# Patient Record
Sex: Female | Born: 1950 | Race: Black or African American | Hispanic: No | Marital: Married | Smoking: Never smoker
Health system: Southern US, Community
[De-identification: ages and names within clinical notes are randomized; demographics above are authoritative.]

## PROBLEM LIST (undated history)

## (undated) DIAGNOSIS — D649 Anemia, unspecified: Secondary | ICD-10-CM

## (undated) DIAGNOSIS — K589 Irritable bowel syndrome without diarrhea: Secondary | ICD-10-CM

## (undated) DIAGNOSIS — R05 Cough: Secondary | ICD-10-CM

## (undated) DIAGNOSIS — F439 Reaction to severe stress, unspecified: Secondary | ICD-10-CM

## (undated) DIAGNOSIS — E049 Nontoxic goiter, unspecified: Secondary | ICD-10-CM

## (undated) DIAGNOSIS — G500 Trigeminal neuralgia: Secondary | ICD-10-CM

## (undated) DIAGNOSIS — R51 Headache: Secondary | ICD-10-CM

## (undated) DIAGNOSIS — Z973 Presence of spectacles and contact lenses: Secondary | ICD-10-CM

## (undated) DIAGNOSIS — K219 Gastro-esophageal reflux disease without esophagitis: Secondary | ICD-10-CM

## (undated) DIAGNOSIS — E119 Type 2 diabetes mellitus without complications: Secondary | ICD-10-CM

## (undated) DIAGNOSIS — J45909 Unspecified asthma, uncomplicated: Secondary | ICD-10-CM

## (undated) DIAGNOSIS — R109 Unspecified abdominal pain: Secondary | ICD-10-CM

## (undated) DIAGNOSIS — R053 Chronic cough: Secondary | ICD-10-CM

## (undated) DIAGNOSIS — Z780 Asymptomatic menopausal state: Secondary | ICD-10-CM

## (undated) DIAGNOSIS — R059 Cough, unspecified: Secondary | ICD-10-CM

## (undated) DIAGNOSIS — R0602 Shortness of breath: Secondary | ICD-10-CM

## (undated) DIAGNOSIS — K5790 Diverticulosis of intestine, part unspecified, without perforation or abscess without bleeding: Secondary | ICD-10-CM

## (undated) DIAGNOSIS — M1990 Unspecified osteoarthritis, unspecified site: Secondary | ICD-10-CM

## (undated) DIAGNOSIS — K635 Polyp of colon: Secondary | ICD-10-CM

## (undated) DIAGNOSIS — R42 Dizziness and giddiness: Secondary | ICD-10-CM

## (undated) DIAGNOSIS — K449 Diaphragmatic hernia without obstruction or gangrene: Secondary | ICD-10-CM

## (undated) HISTORY — DX: Cough: R05

## (undated) HISTORY — DX: Headache: R51

## (undated) HISTORY — DX: Unspecified asthma, uncomplicated: J45.909

## (undated) HISTORY — DX: Diverticulosis of intestine, part unspecified, without perforation or abscess without bleeding: K57.90

## (undated) HISTORY — DX: Diaphragmatic hernia without obstruction or gangrene: K44.9

## (undated) HISTORY — DX: Reaction to severe stress, unspecified: F43.9

## (undated) HISTORY — DX: Chronic cough: R05.3

## (undated) HISTORY — DX: Nontoxic goiter, unspecified: E04.9

## (undated) HISTORY — DX: Presence of spectacles and contact lenses: Z97.3

## (undated) HISTORY — DX: Unspecified osteoarthritis, unspecified site: M19.90

## (undated) HISTORY — DX: Shortness of breath: R06.02

## (undated) HISTORY — DX: Unspecified abdominal pain: R10.9

## (undated) HISTORY — DX: Asymptomatic menopausal state: Z78.0

## (undated) HISTORY — DX: Dizziness and giddiness: R42

## (undated) HISTORY — DX: Polyp of colon: K63.5

## (undated) HISTORY — DX: Gastro-esophageal reflux disease without esophagitis: K21.9

## (undated) HISTORY — DX: Type 2 diabetes mellitus without complications: E11.9

## (undated) HISTORY — DX: Irritable bowel syndrome, unspecified: K58.9

## (undated) HISTORY — DX: Cough, unspecified: R05.9

---
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
## 1979-06-15 HISTORY — PX: LAPAROSCOPY: SHX197

## 1999-08-24 ENCOUNTER — Ambulatory Visit (HOSPITAL_COMMUNITY): Admission: RE | Admit: 1999-08-24 | Discharge: 1999-08-24 | Payer: Self-pay | Admitting: Cardiology

## 2001-03-21 ENCOUNTER — Other Ambulatory Visit: Admission: RE | Admit: 2001-03-21 | Discharge: 2001-03-21 | Payer: Self-pay | Admitting: Family Medicine

## 2001-07-14 ENCOUNTER — Ambulatory Visit (HOSPITAL_COMMUNITY): Admission: RE | Admit: 2001-07-14 | Discharge: 2001-07-14 | Payer: Self-pay | Admitting: Obstetrics and Gynecology

## 2001-07-14 ENCOUNTER — Encounter (INDEPENDENT_AMBULATORY_CARE_PROVIDER_SITE_OTHER): Payer: Self-pay

## 2002-06-14 HISTORY — PX: OTHER SURGICAL HISTORY: SHX169

## 2008-10-13 LAB — HM COLONOSCOPY

## 2008-10-31 LAB — HM COLONOSCOPY

## 2010-06-14 HISTORY — PX: OTHER SURGICAL HISTORY: SHX169

## 2010-06-25 ENCOUNTER — Encounter
Admission: RE | Admit: 2010-06-25 | Discharge: 2010-06-25 | Payer: Self-pay | Source: Home / Self Care | Attending: Otolaryngology | Admitting: Otolaryngology

## 2010-06-25 IMAGING — CT CT NECK W/ CM
4 of 5 series · 16 of 33 positions shown, 19 images · IV contrast (agent unspecified)
Comparison: None

CLINICAL DATA: Right.  Mandibular swelling and pain

CT NECK WITH CONTRAST
TECHNIQUE: Multidetector CT imaging of the neck was performed with
intravenous contrast.
Contrast: 75 ml [id number] IV

[Series 3: axial neck · axial · 0.49mm/px · z∈[-50,+45]mm · 3 of 96 slices shown]
[im 20/96  bone]
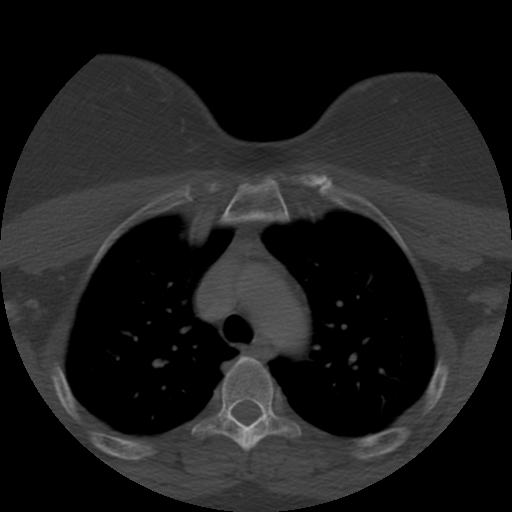
[im 39/96  bone]
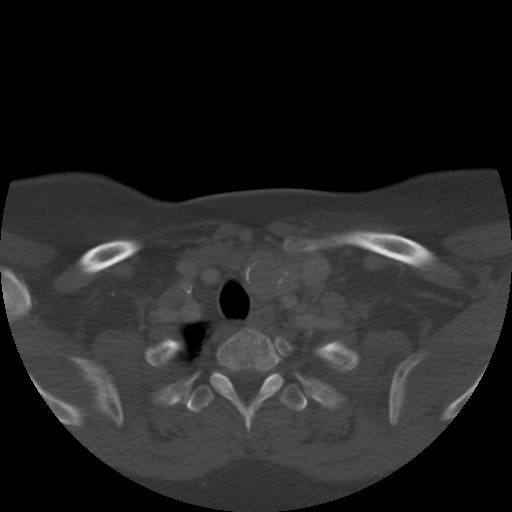
[im 58/96  bone]
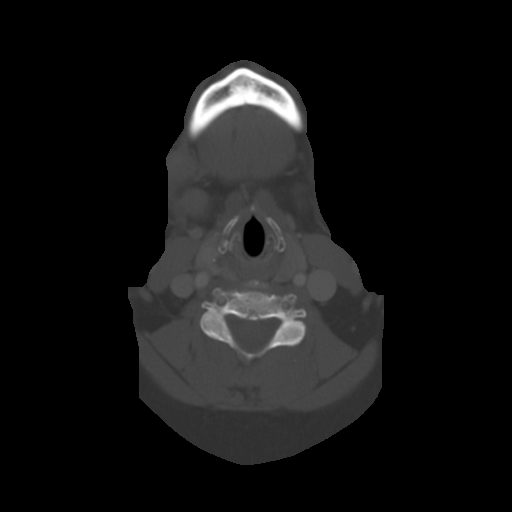

[Series 200: cor · coronal · 0.49mm/px · 3 of 102 slices shown]
[im 33/102  bone]
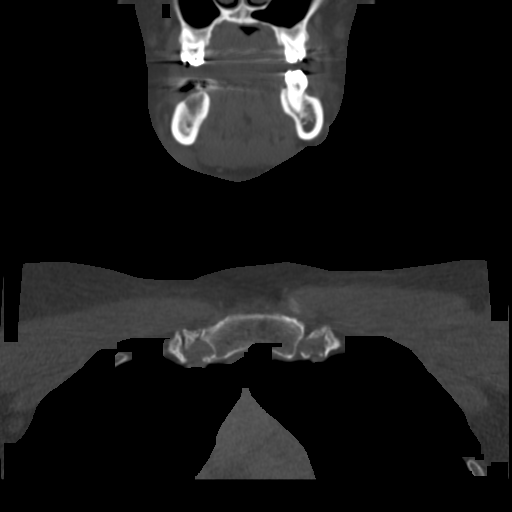
[im 45/102  bone]
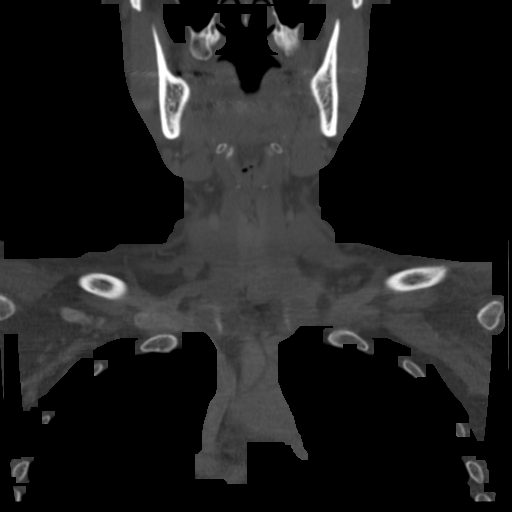
[im 57/102  bone]
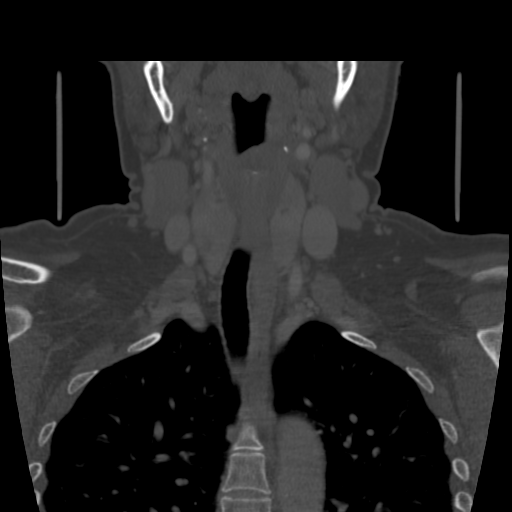

[Series 201: sag · sagittal · 0.49mm/px · 5 of 102 slices shown, 6 images]
[im 34/102  bone]
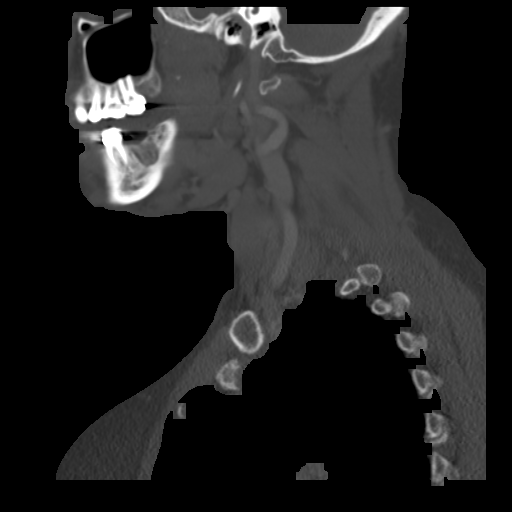
[im 43/102  bone]
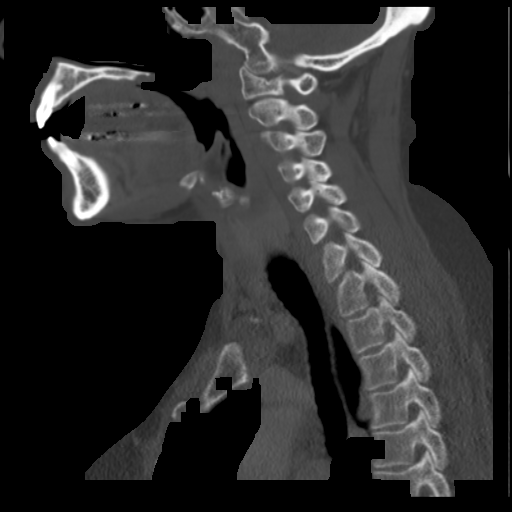
[im 51/102  soft-tissue]
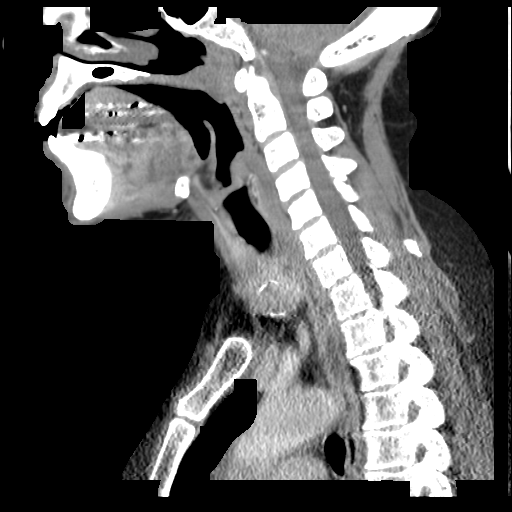
[im 51/102  bone]
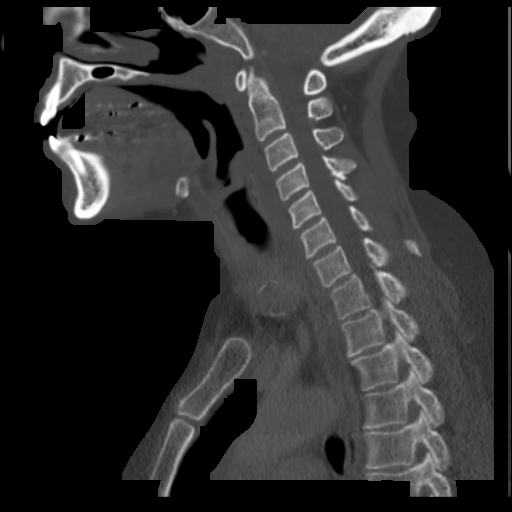
[im 59/102  bone]
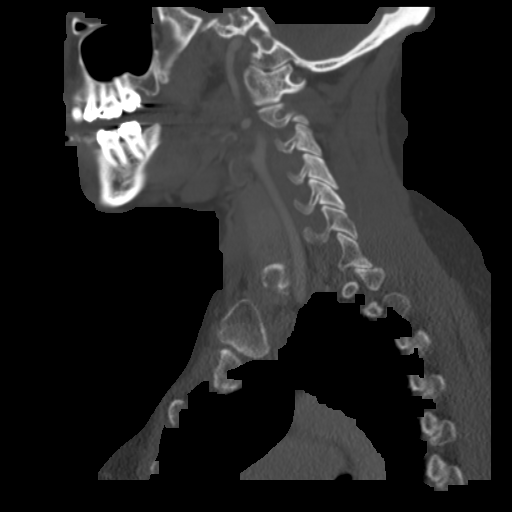
[im 68/102  bone]
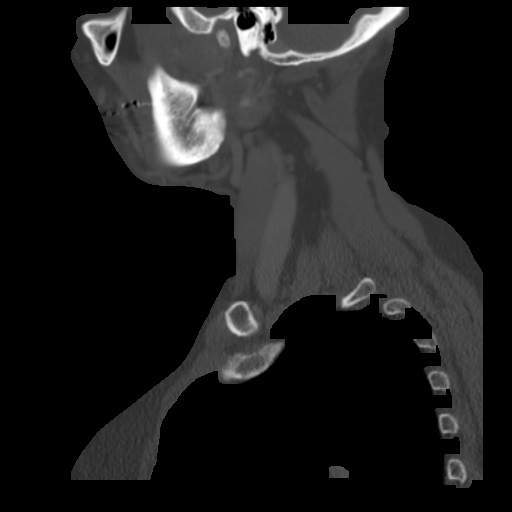

[Series 202: ang axials · axial · 0.49mm/px · z∈[-81,+71]mm · 5 of 118 slices shown, 7 images]
[im 20/118  soft-tissue]
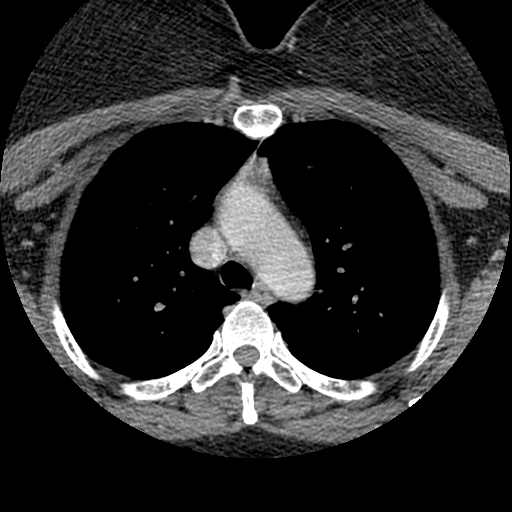
[im 20/118  bone]
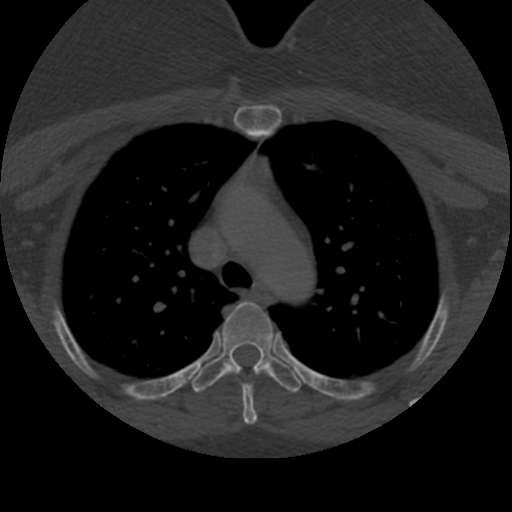
[im 40/118  bone]
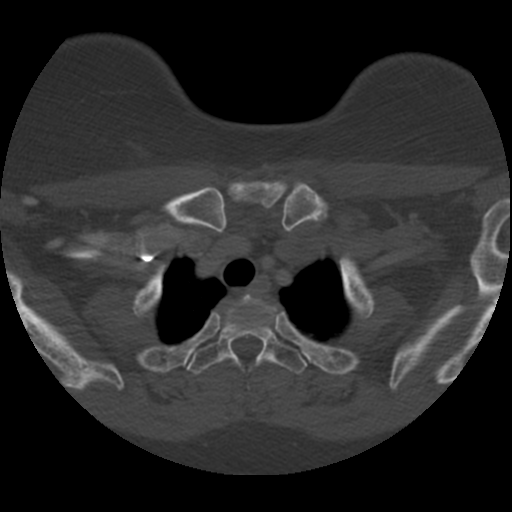
[im 59/118  bone]
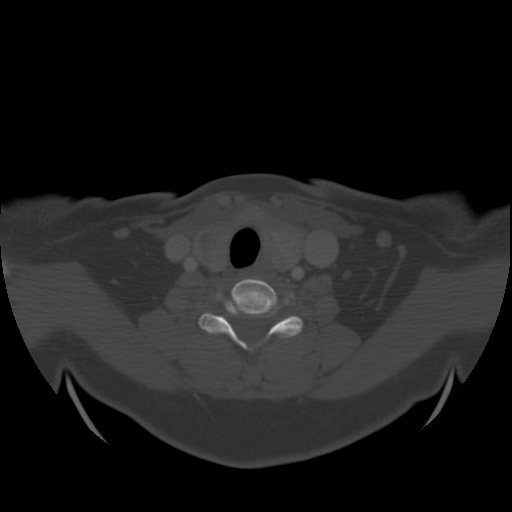
[im 79/118  bone]
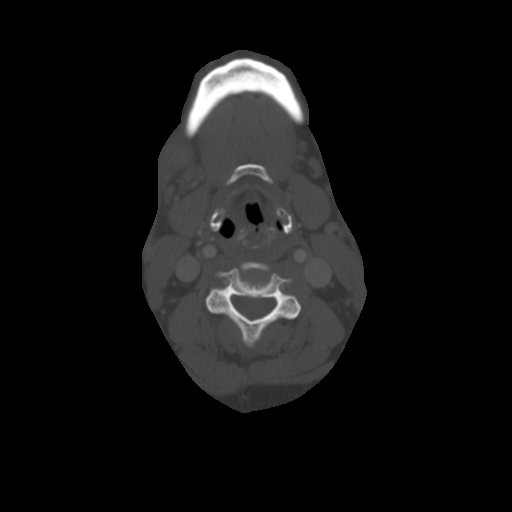
[im 98/118  soft-tissue]
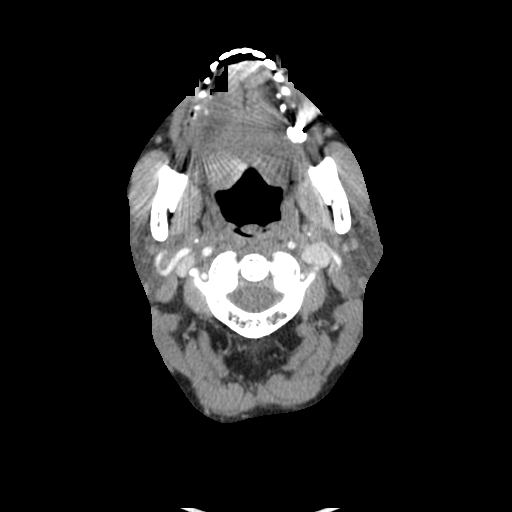
[im 98/118  bone]
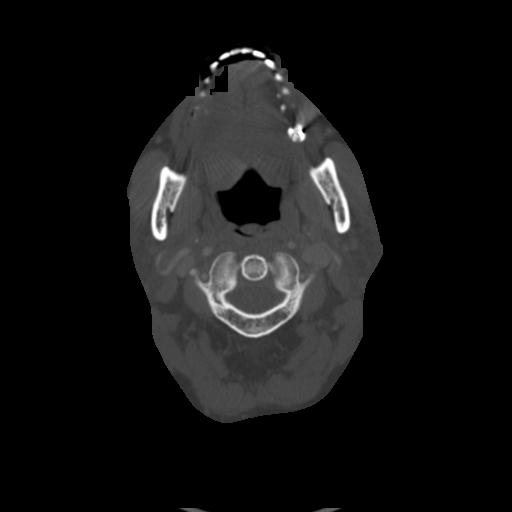

[16 of 33 positions shown; findings below may reference images not displayed]

FINDINGS: Soft tissue mass below the body of the mandible on the
right.  This appears to be centered on the platysmus muscle which
is mildly thickened.  There is stranding and edema in the adjacent
subcutaneous fat.  The soft tissue mass measures approximately 15 x
19 mm and has ill-defined margins and appears to be a solid lesion.
The mass is separate from the mandible and the adjacent mandible
appears normal.  The mass is separate from the submandibular gland
which appears normal.

Submandibular gland is normal bilaterally.  Parotid gland appears
normal bilaterally.  5 x 8 mm benign level I lymph node on the
left.  Small level I lymph nodes in the right medial to the soft
tissue mass.  No pathologic adenopathy in the neck.

Multiple thyroid nodules are present.  Peripheral calcified nodule
the left lower pole measures 14 mm.  Adjacent to this is a 19 mm
nodule with peripheral calcification.  15 mm noncalcified hypodense
lesion in the   right thyroid gland.  Multiple other smaller
thyroid nodules are present bilaterally.

The larynx is normal.  No airway displacement or edema.  Lung
apices are clear.
IMPRESSION: 15 x 19 mm soft tissue mass below the body of the mandible on the
right.  There is surrounding edema and  this may be inflammatory
lesion due to infection.  This could also represent a hematoma.
This could represent an inflamed lymph node.  Metastatic disease to
a lymph node or a primary soft tissue neoplasm are other
considerations.  No pathologic adenopathy.

Multinodular goiter with multiple calcified nodules on the left.
There is a dominant noncalcified nodule on the right, consider
biopsy of the right thyroid nodule.

## 2010-10-30 NOTE — Op Note (Signed)
[facility]  Patient:    Laura Underwood, Laura Underwood Visit Number: [id number] MRN: [id number]          Service Type: DSU Location: [facility] Attending Physician:  Maxie Better Dictated by:   Sheria Lang. Cherly Hensen, M.D. Proc. Date: 07/14/01 Admit Date:  07/14/2001 Discharge Date: 07/14/2001                             Operative Report  PREOPERATIVE DIAGNOSES:         1. Persistent simple left ovarian cyst.                                 2. Metrorrhagia.  POSTOPERATIVE DIAGNOSES:        1. Persistent left ovarian cyst.                                 2. Metrorrhagia.                                 3. Question of submucosal fibroid.  OPERATION:                      Open laparoscopy, left ovarian cystectomy, diagnostic hysteroscopy, dilatation and curettage.  SURGEON:                        Sheronette A. Cherly Hensen, M.D.  ASSISTANT:                      Genia Del, M.D.  ANESTHESIA:                     General.  INDICATIONS:                    This is a 60 year old gravida 3, para 68, female with last menstrual period of June 27, 2001, with a 3.5 cm persistent left ovary cyst of one years duration who now presents for surgical evaluation and management.  The patient also has been having one day of very heavy vaginal bleeding and has been noted on several occasions to have had a thickened endometrium without any evidence of endometrial polyp or uterine fibroids.  The patients history is notable for two previous cesarean sections. The risks and benefits of the procedure have been explained to the patient and consent signed.  The patient was transferred to the operating room.  DESCRIPTION OF PROCEDURE:       Under adequate general anesthesia, the patient was placed in the dorsal lithotomy position.  Examination under anesthesia revealed an anteverted uterus, palpable left adnexal mass.  The right adnexa was without any palpable mass.  The  abdomen, perineum, vagina were thoroughly prepped and draped in the usual fashion.  The bladder was catheterized without any urine noted.  The patient had voided prior to entering the room.  Bivalve speculum was placed in the vagina.  A single-tooth tenaculum was placed in the anterior lip of the cervix.  The cervix has a small Nabothian cyst at around 7 oclock.  The cervical os was serially dilated up to #25 [facility] dilator, and a 5 mm diagnostic hysteroscope was introduced into the uterine cavity without incident.  Inspection was notable for both tubal ostia being seen but sclerosed.  Thickened endometrium circumferentially with question of polyp versus displacement of thickened endometrium.  No evidence of fibroid noted. The hysteroscope was removed.  The cavity was then curetted for a large amount of tissue.  Reinsertion of the diagnostic hysteroscope was notable for continued thickened endometrium.  The hysteroscope was removed.  The cavity was again curetted, and it was notable particularly in the left posterior aspect of the uterus that there was irregularity suggestive of possible submucosal fibroid.  Since no actual polypoid lesion in that area was seen. Once again, the cavity was curetted.  The hysteroscope was subsequently removed.  Endocervical canal was without polyps.  An acorn cannula was introduced into the cervical os and attached to the tenaculum for the manipulation of the uterus.  The bivalve speculum was then removed maintaining thorough technique.  Attention was then turned to the abdomen where a 0.25% Marcaine was injected sub/infraumbilically and a transverse incision was then made and carried down sharply using a scalpel to the rectus fascia.  The rectus fascia was carefully incised and extended vertically.  With entry into the peritoneum confirmed, 0 Vicryl stay sutures were placed on the fascia and a Hasson cannula was introduced, set in place and through this Hasson  the operative laparoscope was introduced.  Initial incisions were then made suprapubic through a 5 mm port which was then placed under direct visualization.  Through that port, the pelvis was inspected and a panoramic view was noted.  No evidence of scarring was noted from her prior surgery. The anterior cul-de-sac was without any scar tissue seen or endometriosis. Posteriorly, there was a small collection of dark spot in one area possibly related to endometriosis but no other areas were seen in the pelvis.  With manipulation of the acorn and tenaculum apparatus the uterus was mobilized. The right tube and ovaries were noted to be normal.  The left tube was normal. The left ovary contained a 3-4 cm cystic structure.  The appendix was noted to be attached to the right abdominal side wall but otherwise without erythema and was noted to be soft.  The liver edge was noted to be normal.  After injecting 0.25% Marcaine in the left lower quadrant, an incision was then made, and a 10 mm port was introduced under direct visualization.  Using the 5 mm adapter through that site the ovary was stabilized using atraumatic grasper.  The antimesenteric surface of the ovary was then cauterized in a linear fashion using Endoshears.  The capsule was then opened and using the scissors the cyst wall was separated from the normal ovarian tissue.  Using the alligator clamps and with also the irrigating system, the ovarian cyst was separated intact from its normal ovarian tissue.  At this point, the base to which the ovarian cyst was still attached was still in place and using the Endobag through the left lower quadrant port the cyst was able to be removed and placed in the bag intact.  At that point, the bag was with the cyst in place was brought to the left lower quadrant site and the trocar removed at that point.  The bag was further opened, and the needle on the syringe was then utilized to aspirate the fluid  from the cyst and remove the bag with the ovarian cyst wall through that left lower quadrant port.  Through the left lower quadrant port the 10 mm trocar was then replaced.  The ovary on the left  was then regrasped and the base was inspected and irrigated.  Small bleeders were cauterized and abdomen irrigated and suctioned of debris with good hemostasis subsequently noted.  The lower ports were removed under direct visualization.  The abdomen was deflated and the upper port was removed. Using S curved retractors, the umbilical incision of the fascia was identified and figure-of-eight sutures were placed and closure with the stay sutures of 0 Vicryl was also done.  The area was then irrigated and small bleeders cauterized.  The skin was approximated using 4-0 Vicryl subcuticular stitch. The left lower quadrant incision also resulted in the fascia being identified and closed with interrupted figure-of-eight suture.  The skin was approximated using 4-0 Vicryl suture as was the suprapubic site.  All of the instruments in the vagina were then removed, specimen labeled endometrial curetting and left ovarian cyst was sent to pathology.  Estimated blood loss was minimal.  The fluid deficit from the hysteroscopic portion was 60 cc.  Complications were none.  The patient tolerated the procedure well and was transferred to the recovery room in stable condition. Dictated by:   Sheria Lang. Cherly Hensen, M.D. Attending Physician:  Maxie Better DD:  07/14/01 TD:  07/17/01 Job: [id number]

## 2010-12-18 ENCOUNTER — Encounter (INDEPENDENT_AMBULATORY_CARE_PROVIDER_SITE_OTHER): Payer: Self-pay | Admitting: Surgery

## 2010-12-18 ENCOUNTER — Ambulatory Visit (INDEPENDENT_AMBULATORY_CARE_PROVIDER_SITE_OTHER): Payer: BC Managed Care – PPO | Admitting: Surgery

## 2010-12-18 VITALS — BP 119/82 | HR 83 | Ht 65.0 in | Wt 194.4 lb

## 2010-12-18 DIAGNOSIS — E049 Nontoxic goiter, unspecified: Secondary | ICD-10-CM

## 2010-12-18 NOTE — Progress Notes (Signed)
Subjective:     Patient ID: Laura Underwood, female   DOB: Dec 04, 1950, 60 y.o.   MRN: [id number]    BP 119/82  Pulse 83  Ht 5\' 5"  (1.651 m)  Wt 194 lb 6.4 oz (88.179 kg)  BMI 32.35 kg/m2    HPI  The patient presents today due to acute multiple thyroid nodules. She is referred by Dr. Concepcion Elk due to these thyroid nodules. She had a cyst became infected in January involving her salivary gland that was drained by Dr. Shon Hough. CT scan obtained at that time showed these thyroid nodules. Multiple thyroid nodules are present. One nodule is in the left lower pole and measures 14 mm. A second nodule is adjacent to this and measures 19 mm with peripheral calcification. A 15 mm nodule is noted in the right thyroid lobe. Smaller nodules are present bilaterally. The patient denies any significant swelling of her neck, masses, or difficulty breathing. Her thyroid function is normal. She does have issues with fatigue. She is not losing weight. She has occasional palpitations.  Review of Systems  Constitutional: Positive for fatigue. Negative for fever and unexpected weight change.  HENT: Negative for hearing loss, ear pain, neck pain and neck stiffness.   Eyes: Negative.   Respiratory: Positive for cough. Negative for shortness of breath and stridor.   Cardiovascular: Positive for palpitations. Negative for chest pain.  Gastrointestinal: Negative.   Genitourinary: Negative.   Musculoskeletal: Negative.   Skin: Negative.   Neurological: Negative.   Hematological: Negative.   Psychiatric/Behavioral: Negative.        Objective:   Physical Exam  Constitutional: She is oriented to person, place, and time. She appears well-developed and well-nourished.  HENT:  Head: Normocephalic and atraumatic.  Nose: Nose normal.  Eyes: Conjunctivae and EOM are normal. Pupils are equal, round, and reactive to light. No scleral icterus.  Neck: Normal range of motion. Neck supple. No tracheal deviation present. No  thyromegaly present.       Thyroid soft.  No masses noted.  Cardiovascular: Normal rate, regular rhythm and intact distal pulses.   Pulmonary/Chest: Effort normal and breath sounds normal.  Musculoskeletal: Normal range of motion.  Neurological: She is alert and oriented to person, place, and time. No cranial nerve deficit.  Skin: Skin is warm and dry. No rash noted.  Psychiatric: She has a normal mood and affect. Her behavior is normal. Judgment and thought content normal.       Assessment:    Multinodular goiter. Plan:  I will obtain an ultrasound of her thyroid gland. If her thyroid ultrasound in stable compared to her CT scan of her neck January of 2012 I will see her back in 6 months. If there are any suspicious changes to her  U/S , I RECOMMENDED FINE NEEDLE ASPIRATION OF THE NODULES.

## 2010-12-18 NOTE — Patient Instructions (Signed)
We will schedule an Ultrasound of the thyroid for the nodules.  If anything appears suspicious,  We will arrange for a biopsy.  If everything appears stable,  I will see you in six months.

## 2010-12-21 ENCOUNTER — Ambulatory Visit
Admission: RE | Admit: 2010-12-21 | Discharge: 2010-12-21 | Disposition: A | Discharge status: Home / Self Care | Payer: Self-pay | Source: Ambulatory Visit | Attending: Surgery | Admitting: Surgery

## 2010-12-21 DIAGNOSIS — E049 Nontoxic goiter, unspecified: Secondary | ICD-10-CM

## 2010-12-21 IMAGING — US US SOFT TISSUE HEAD/NECK
1 series · 14 of 25 positions shown · non-contrast
Comparison: None.

CLINICAL DATA: Follow up multinodular goiter

THYROID ULTRASOUND
TECHNIQUE: Ultrasound examination of the thyroid gland and adjacent
soft tissues was performed.

[Series 1: us soft tissue head/neck · 0.06mm/px · 14 of 78 slices shown]
[im 1/78]
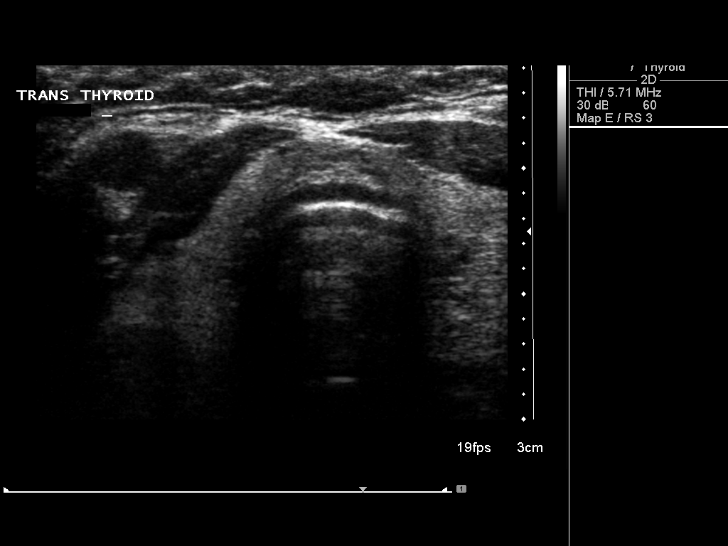
[im 7/78]
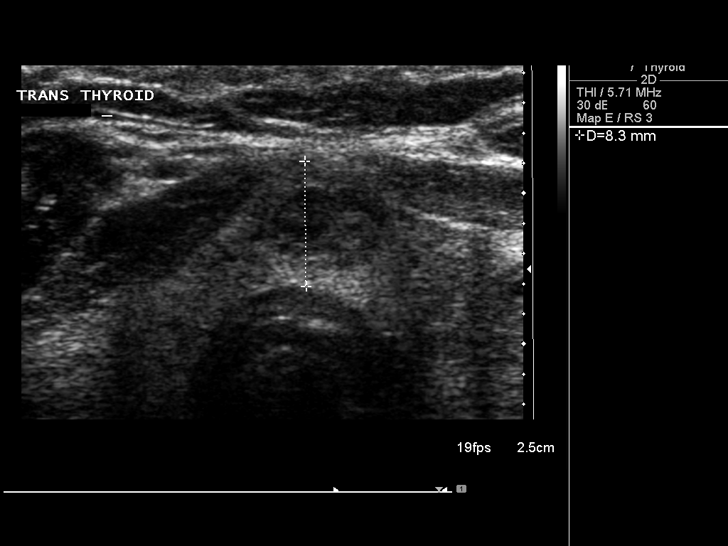
[im 13/78]
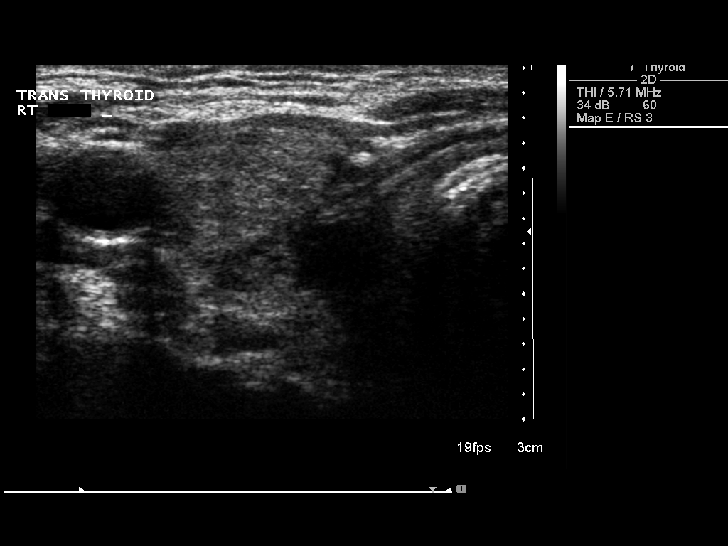
[im 20/78]
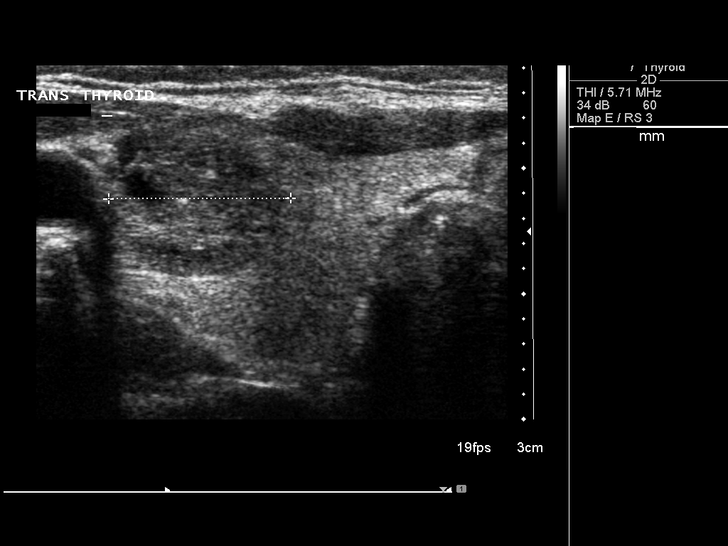
[im 26/78]
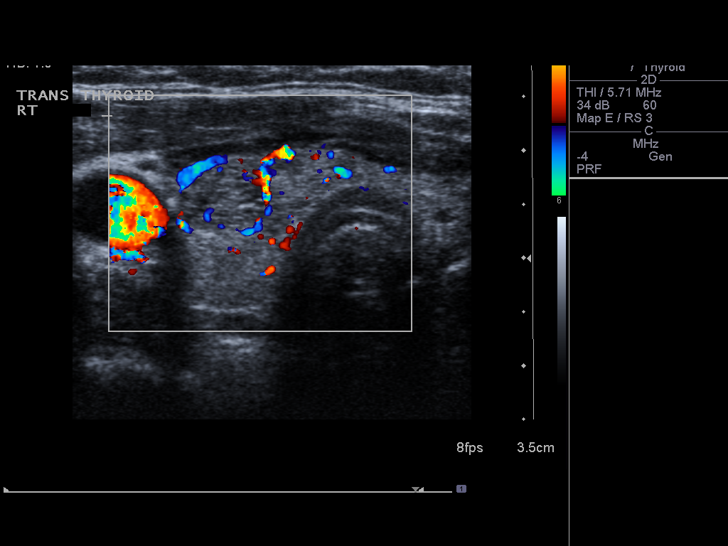
[im 29/78]
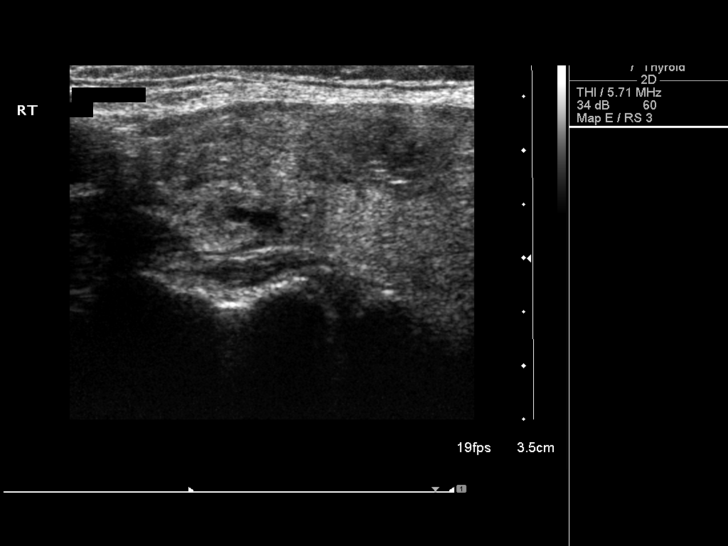
[im 36/78]
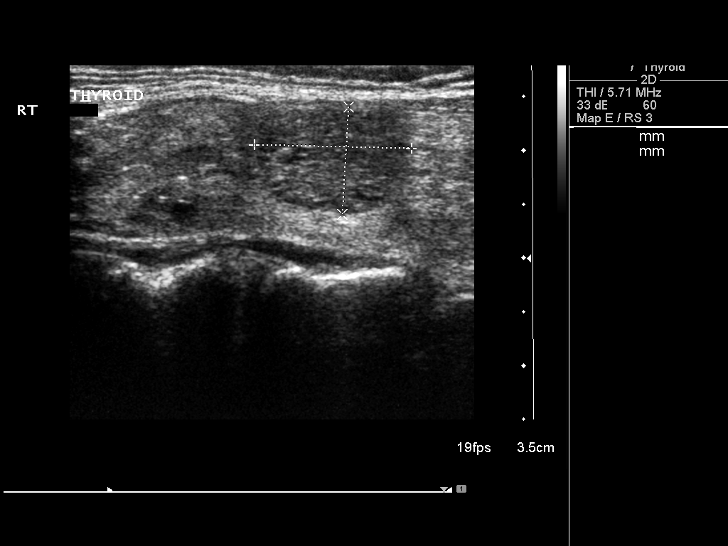
[im 42/78]
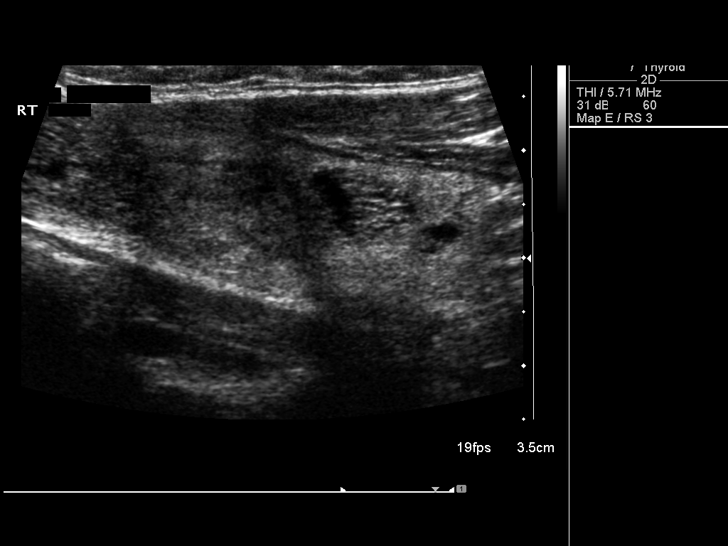
[im 49/78]
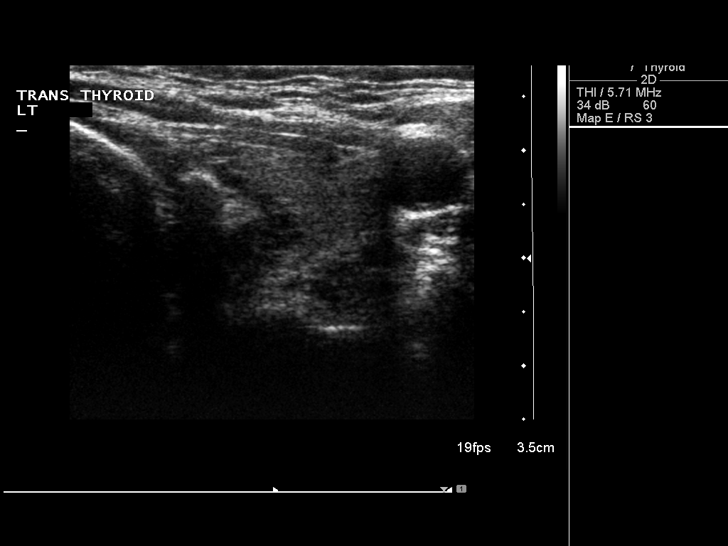
[im 52/78]
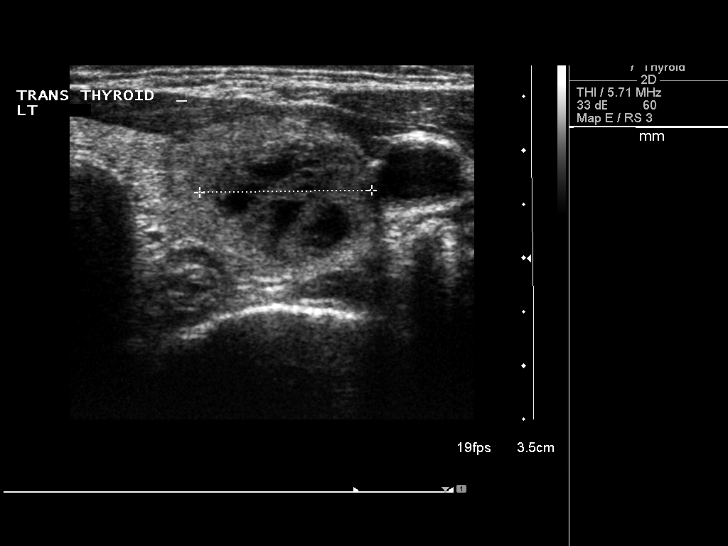
[im 58/78]
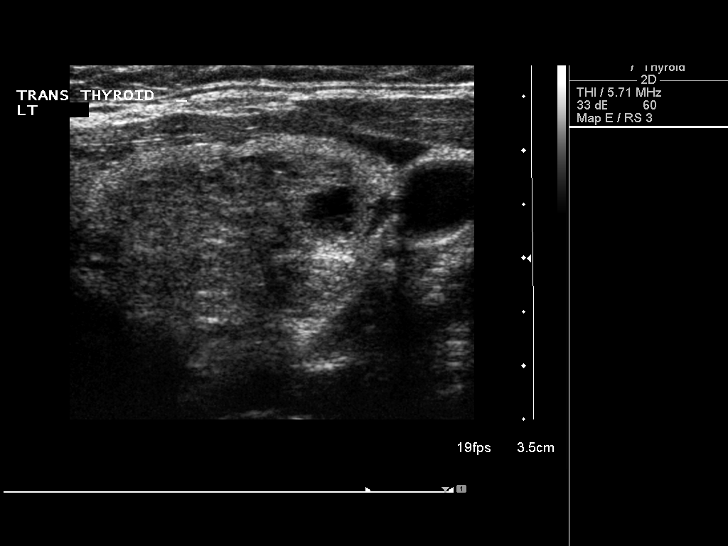
[im 65/78]
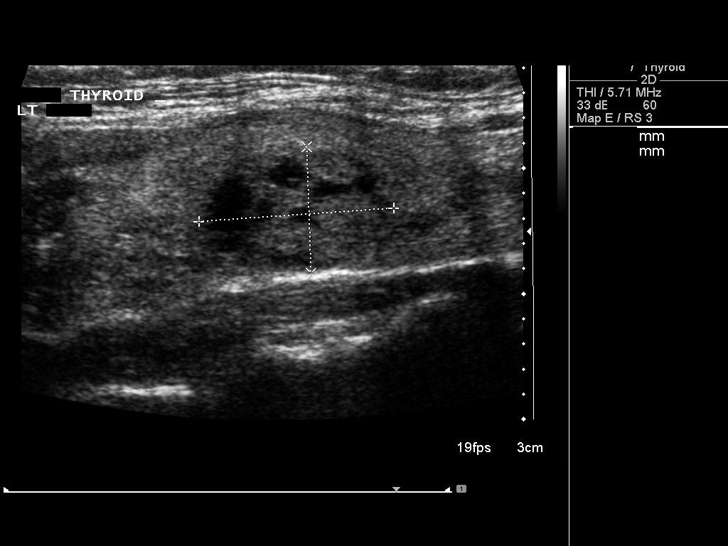
[im 71/78]
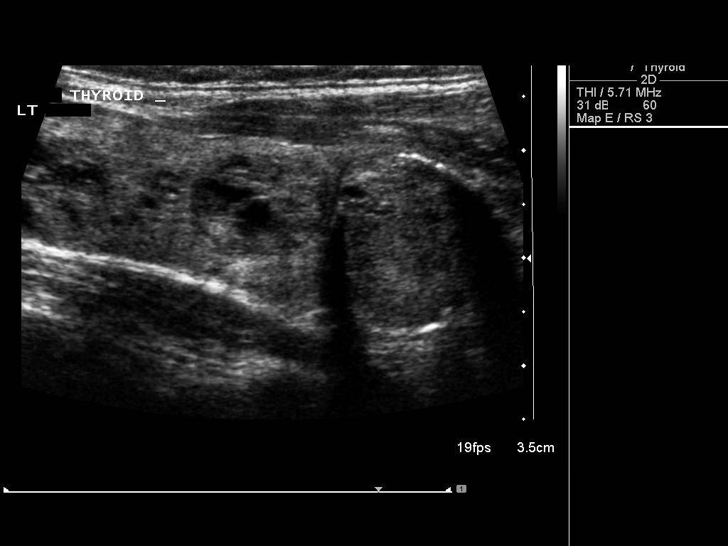
[im 78/78]
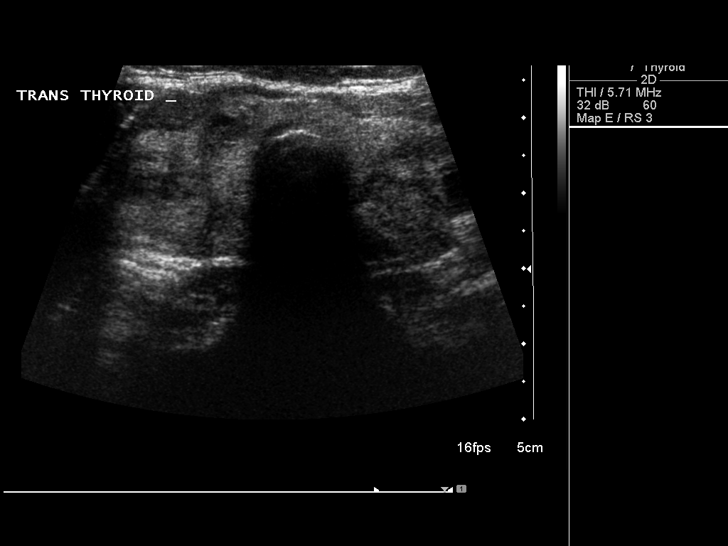

[14 of 25 positions shown; findings below may reference images not displayed]

FINDINGS: Right thyroid lobe:  5.9 x 1.7 x 2.2 cm.
Left thyroid lobe:  7.9 x 2.6 x 2.8 cm.
Isthmus:  8 mm in thickness.

Focal nodules:  The gland is inhomogeneous in echogenicity.
Multiple nodules are present bilaterally.  The largest solid nodule
is in the lower pole of the left lobe measuring 1.7 x 1.7 x 1.7 cm
with calcifications.  A nodule in the mid left lobe measures 1.8 x
0.9 x 1.3 cm.  A mixed echogenic nodule in the upper pole on the
left measures 1.6 x 1.0 x 1.6 cm.

The largest nodule on the right is within the right mid lobe of
x 1.0 x 1.5 cm.  A right isthmic nodule measures 1.5 x 0.9 x
cm.  A solid nodule in the upper pole on the right measures 1.4 x
1.2 x 1.2 cm.

Lymphadenopathy:  None visualized.
IMPRESSION: Multiple thyroid nodules in this diffusely enlarged and
inhomogeneous gland, most consistent with multinodular goiter.

## 2011-02-01 ENCOUNTER — Encounter: Payer: Self-pay | Admitting: Pulmonary Disease

## 2011-02-02 ENCOUNTER — Ambulatory Visit (INDEPENDENT_AMBULATORY_CARE_PROVIDER_SITE_OTHER): Payer: BC Managed Care – PPO | Admitting: Pulmonary Disease

## 2011-02-02 ENCOUNTER — Encounter: Payer: Self-pay | Admitting: Pulmonary Disease

## 2011-02-02 VITALS — BP 134/82 | HR 71 | Temp 98.3°F | Ht 65.0 in | Wt 195.4 lb

## 2011-02-02 DIAGNOSIS — R05 Cough: Secondary | ICD-10-CM | POA: Insufficient documentation

## 2011-02-02 MED ORDER — OMEPRAZOLE 40 MG PO CPDR: 40.0000 mg | DELAYED_RELEASE_CAPSULE | Freq: Two times a day (BID) | ORAL | Status: DC

## 2011-02-02 MED ORDER — BENZONATATE 100 MG PO CAPS: 200.0000 mg | ORAL_CAPSULE | Freq: Four times a day (QID) | ORAL | Status: AC | PRN

## 2011-02-02 MED ORDER — HYDROCOD POLST-CPM POLST ER 10-8 MG PO CP12: 1.0000 | ORAL_CAPSULE | Freq: Two times a day (BID) | ORAL | Status: DC | PRN

## 2011-02-02 NOTE — Assessment & Plan Note (Signed)
The pt has a chronic cough of 2 yrs duration that I suspect is more upper airway in origin than lower.  She has a history of reflux disease, but did not respond to over-the-counter medications for this.  I think she needs a trial of b.i.d. Proton pump inhibitor.  She also has significant postnasal drip, and is scheduled to see an allergist in 3 days.  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  Because of the long-standing nature of her cough, this may be more neurogenic in origin, and therefore may need to be treated with tramadol or possibly Neurontin.  We'll start with the cyclical cough protocol, treating for reflux, and also treatment of postnasal drip by her allergist.  Her spirometry today is totally normal, but this does not 100% exclude cough variant asthma.  May need to consider methacholine challenge if she continues to be symptomatic.

## 2011-02-02 NOTE — Progress Notes (Signed)
  Subjective:    Patient ID: Laura Underwood, female    DOB: August 09, 1950, 60 y.o.   MRN: [id number]  HPI The patient is a 60 year old female who I've been asked to see for chronic cough.  Patient states as she has been coughing for a least 2 years, and occurs every day.  It is typically dry and barking in nature, but does not awaken her from sleep.  It is worse at night and also with prolonged conversation, and she admits to frequent throat clearing and a feeling of a globus sensation.  She denies any significant dyspnea on exertion with mild to moderate activities, but can get winded with heavy exertion.  She admits however that she has gained significant weight and is deconditioned.  Patient does have a history of postnasal drip for which she was taking Zyrtec, however she has discontinued this for her upcoming allergy evaluation the end of the week.  She denies any history of recurrent sinusitis.  She often has cough paroxysms that can lead to near vomiting and gagging.  She does have some mild symptoms of reflux, and takes over-the-counter preparations for this.  She has a history of childhood asthma, but this has not been an issue for her during adulthood.  She has a recent chest x-ray that is unavailable for review, but I did look at her CT scan from 2010 which was unremarkable.  She has had spirometry in 2010 that showed in adequate technique with an expiratory time less than 6 seconds.   Review of Systems  Constitutional: Negative for fever and unexpected weight change.  HENT: Positive for ear pain, sneezing and dental problem. Negative for nosebleeds, congestion, sore throat, rhinorrhea, trouble swallowing, postnasal drip and sinus pressure.   Eyes: Negative for redness and itching.  Respiratory: Positive for cough and shortness of breath. Negative for chest tightness and wheezing.   Cardiovascular: Positive for palpitations. Negative for leg swelling.  Gastrointestinal: Negative for nausea and  vomiting.  Genitourinary: Negative for dysuria.  Musculoskeletal: Positive for joint swelling.  Skin: Negative for rash.  Neurological: Positive for headaches.  Hematological: Does not bruise/bleed easily.  Psychiatric/Behavioral: Negative for dysphoric mood. The patient is not nervous/anxious.        Objective:   Physical Exam Constitutional:  Obese female, no acute distress  HENT:  Nares patent without discharge, but enlarged turbinates  Oropharynx without exudate, palate and uvula are normal  Eyes:  Perrla, eomi, no scleral icterus  Neck:  No JVD, no TMG  Cardiovascular:  Normal rate, regular rhythm, no rubs or gallops.  No murmurs        Intact distal pulses  Pulmonary :  Normal breath sounds, no stridor or respiratory distress   No rales, rhonchi, or wheezing  Abdominal:  Soft, nondistended, bowel sounds present.  No tenderness noted.   Musculoskeletal:  No lower extremity edema noted.  Lymph Nodes:  No cervical lymphadenopathy noted  Skin:  No cyanosis noted  Neurologic:  Alert, appropriate, moves all 4 extremities without obvious deficit.         Assessment & Plan:

## 2011-02-02 NOTE — Patient Instructions (Signed)
Will treat with omeprazole 40mg  one in am and pm before eating for next 3 weeks No throat clearing, use hard candy to bathe back of throat, limit voice use. Will treat with cyclical cough protocol, but do not start until after you have seen Dr. Willa Rough.  See handout.   Please call me a few days after doing the cyclical cough protocol, and let me know how things are going.

## 2011-06-21 ENCOUNTER — Encounter (INDEPENDENT_AMBULATORY_CARE_PROVIDER_SITE_OTHER): Payer: Self-pay | Admitting: Surgery

## 2011-06-21 ENCOUNTER — Ambulatory Visit (INDEPENDENT_AMBULATORY_CARE_PROVIDER_SITE_OTHER): Payer: BC Managed Care – PPO | Admitting: Surgery

## 2011-06-21 VITALS — BP 132/86 | HR 72 | Temp 97.0°F | Resp 16 | Ht 65.0 in | Wt 193.4 lb

## 2011-06-21 DIAGNOSIS — E042 Nontoxic multinodular goiter: Secondary | ICD-10-CM

## 2011-06-21 NOTE — Patient Instructions (Signed)
Goiter Goiter is an enlarged thyroid gland. The thyroid gland sits at the base of the front of the neck. The gland produces hormones that regulate mood, body temperature, pulse rate, and digestion. Most goiters are painless and are not a cause for serious concern. Goiters and conditions that cause goiters can be treated if necessary.  CAUSES  Common causes of goiter include:  Graves disease (causes too much hormone to be produced [hyperthyroidism]).   Hashimoto's disease (causes too little hormone to be produced [hypothyroidism]).   Thyroiditis (inflammation of the thyroid sometimes caused by virus or pregnancy).   Nodular goiter (small bumps form; sometimes called toxic nodular goiter).   Pregnancy.   Thyroid cancer (very few goiters with nodules are cancerous).   Certain medications.   Radiation exposure.   Iodine deficiency (more common in developing countries in inland populations).  RISK FACTORS Risk factors for goiter include:  A family history of goiter.   Female gender.   Inadequate iodine in the diet.   Age older than 40 years.  SYMPTOMS  Many goiters do not cause symptoms. When symptoms do occur, they may include:  Swelling in the lower part of the neck. This swelling can range from a very small bump to a large lump.   A tight feeling in the throat.   A hoarse voice.  Less commonly, a goiter may result in:  Coughing.   Wheezing.   Difficulty swallowing.   Difficulty breathing.   Bulging neck veins.   Dizziness.  When a goiter is the result of hyperthyroidism, symptoms may include:  Rapid or irregular heart beat.   Sicknessin your stomach (nausea).   Vomiting.   Diarrhea.   Shaking.   Irritable feeling.   Bulging eyes.   Weight loss.   Heat sensitivity.   Anxiety.  When a goiter is the result of hypothyroidism, symptoms may include:  Tiredness.   Dry skin.   Constipation.   Weight gain.   Irregular menstrual cycle.    Depressed mood.   Sensitivity to cold.  DIAGNOSIS  Tests used to diagnose goiter include:  A physical exam.   Blood tests, including thyroid hormone levels and antibody testing.   Ultrasonography, computerized X-ray scan (computed tomography, CT) or computerized magnetic scan (magnetic resonance imaging, MRI).   Thyroid scan (imaging along with safe radioactive injection).   Tissue sample taken (biopsy) of nodules. This is sometimes done to confirm that the nodules are not cancerous.  TREATMENT  Treatment will depend on the cause of the goiter. Treatment may include:  Monitoring. In some cases, no treatment is necessary, and your doctor will monitor yourcondition at regular check ups.   Medications and supplements. Thyroid medication (thyroid hormone replacement) is available for hyperthroidism and hypothyroidism.   If inflammation is the cause, over-the-counter medication or steroid medication may be recommended.   Goiters caused by iodine deficiency can be treated with iodine supplements or changes in diet.   Radioactive iodine treatment. Radioactive iodine is injected into the blood. It travels to the thyroid gland, kills thyroid cells, and reduces the size of the gland. This is only used when the thyroid gland is overactive. Lifelong thyroid hormone medication is often necessary after this treatment.   Surgery. A procedure to remove all or part of the gland may be recommended in severe cases or when cancer is the cause. Hormones can be taken to replace the hormones normally produced by the thyroid.  HOME CARE INSTRUCTIONS   Take medications as directed.  Follow your caregiver's recommendations for any dietary changes.   Follow up with your caregiver for further examination and testing, as directed.  PREVENTION   If you have a family history of goiter, discuss screening with your doctor.   Make sure you are getting enough iodine in your diet.   Use of iodized table  salt can help prevent iodine deficiency.  Document Released: 11/18/2009 Document Revised: 02/10/2011 Document Reviewed: 11/18/2009 [facility] Patient Information 2012 [place], [place].

## 2011-06-21 NOTE — Progress Notes (Signed)
Subjective:     Patient ID: Laura Underwood, female   DOB: 10-04-1950, 61 y.o.   MRN: [id number]  HPI The patient returns today for followup of thyroid goiter. She's been worked up for a chronic cough by her pulmonologist. She denies any sensation of neck fullness or difficulty swallowing. She has had no dramatic changes in weight. Denies any excessive irritability or palpitations. Last ultrasound 6 months ago which showed a multi-nodular goiter.   Review of Systems  Constitutional: Negative.   HENT: Positive for postnasal drip. Negative for facial swelling and neck pain.   Respiratory: Positive for cough and stridor.   Cardiovascular: Negative.        Objective:   Physical Exam  Constitutional: She appears well-developed and well-nourished.  HENT:  Head: Normocephalic and atraumatic.  Eyes: EOM are normal. Pupils are equal, round, and reactive to light.  Neck: Normal range of motion. Neck supple. No tracheal deviation present. Thyromegaly present.  Cardiovascular: Normal rate and regular rhythm.   Pulmonary/Chest: Effort normal and breath sounds normal.  Lymphadenopathy:    She has no cervical adenopathy.       Assessment:     Multi-nodular goiter    Plan:     The patient's exam is stable. I will send her for a followup ultrasound. If this is stable, she can follow up in 6 months.

## 2011-07-13 ENCOUNTER — Ambulatory Visit
Admission: RE | Admit: 2011-07-13 | Discharge: 2011-07-13 | Disposition: A | Discharge status: Home / Self Care | Payer: BC Managed Care – PPO | Source: Ambulatory Visit | Attending: Surgery | Admitting: Surgery

## 2011-07-13 DIAGNOSIS — E042 Nontoxic multinodular goiter: Secondary | ICD-10-CM

## 2011-07-13 IMAGING — US US SOFT TISSUE HEAD/NECK
1 series · 13 of 25 positions shown · non-contrast
Comparison: 12/21/2010

CLINICAL DATA: Follow up of goiter.

THYROID ULTRASOUND
TECHNIQUE: Ultrasound examination of the thyroid gland and adjacent
soft tissues was performed.

[Series 1: us soft tissue head/neck · 0.08mm/px · 13 of 55 slices shown]
[im 1/55]
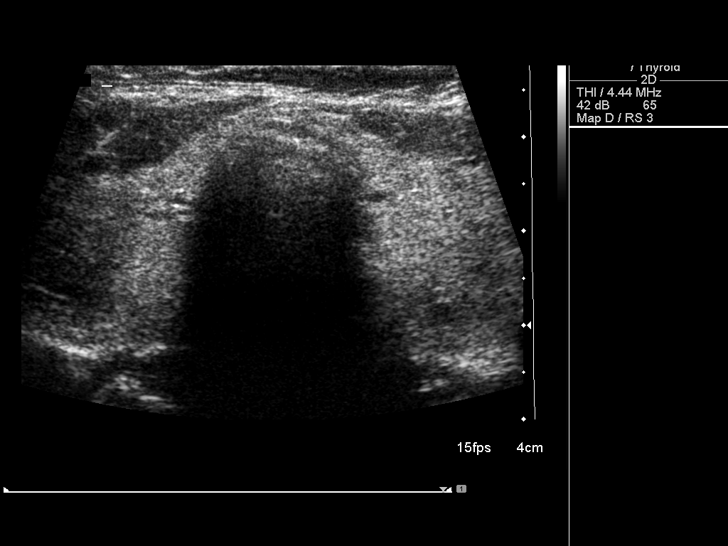
[im 5/55]
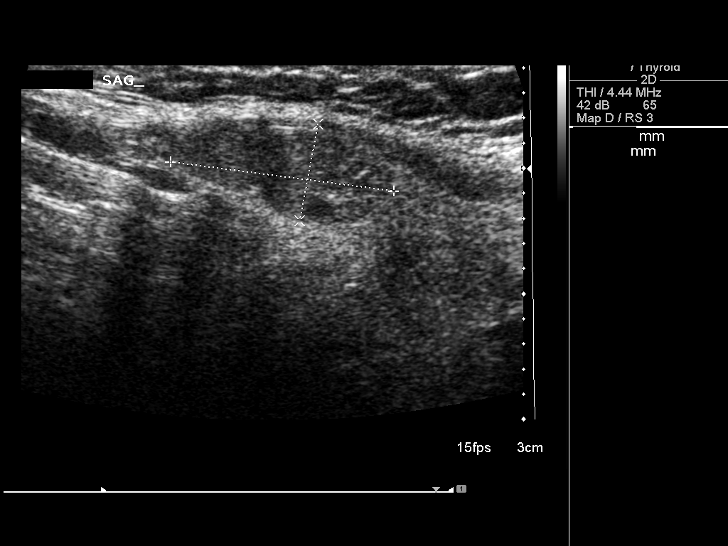
[im 10/55]
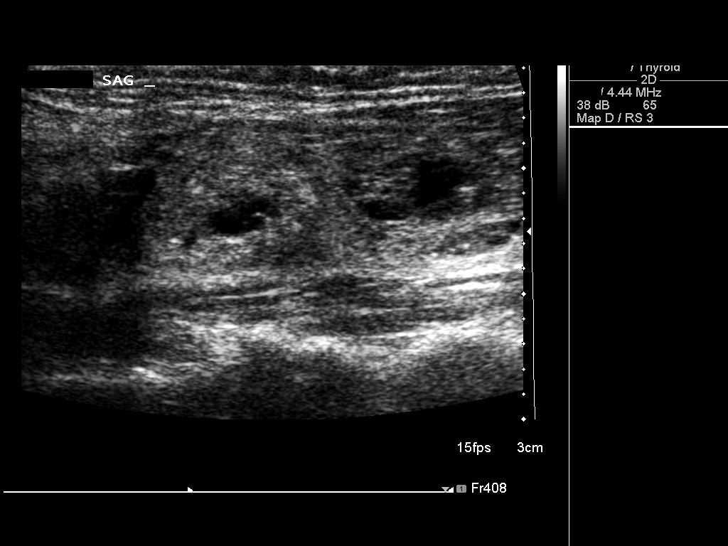
[im 14/55]
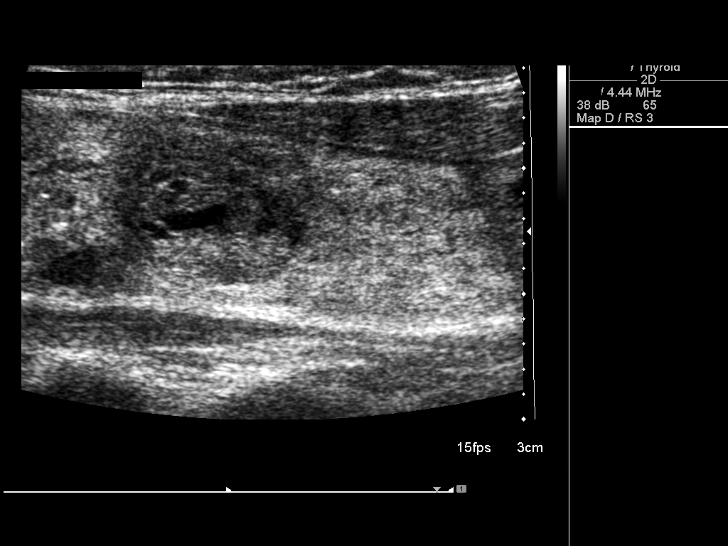
[im 19/55]
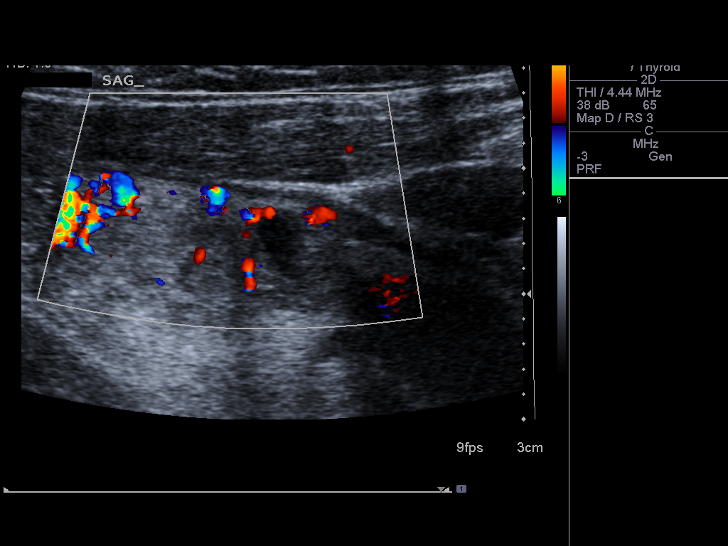
[im 23/55]
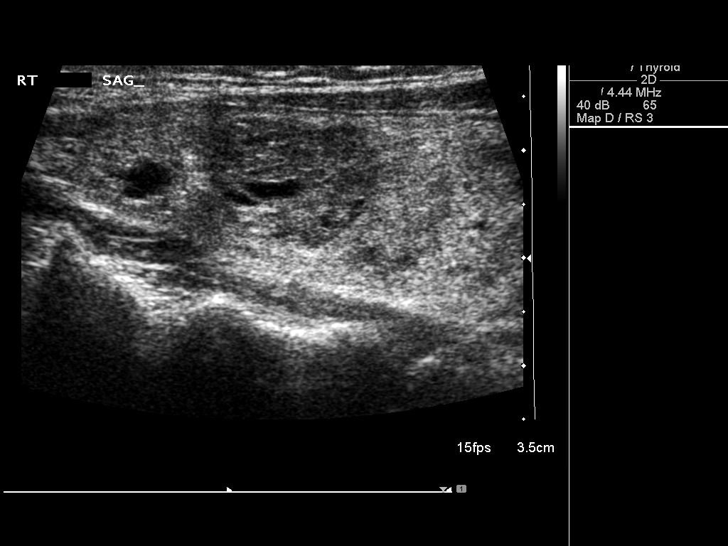
[im 28/55]
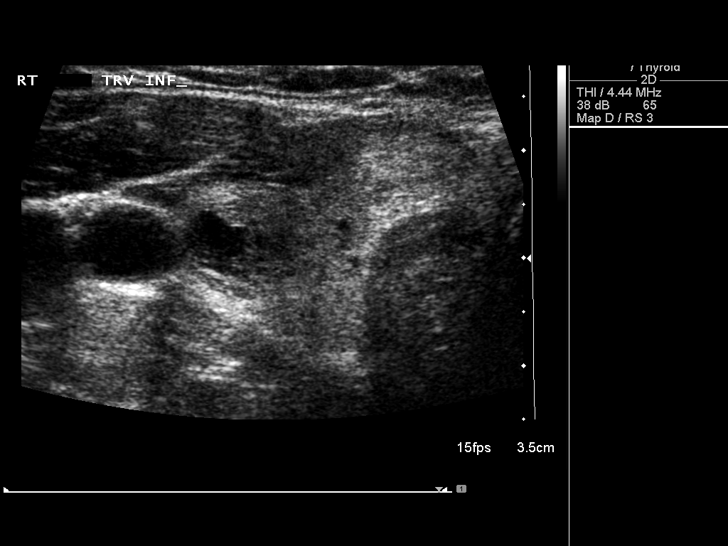
[im 32/55]
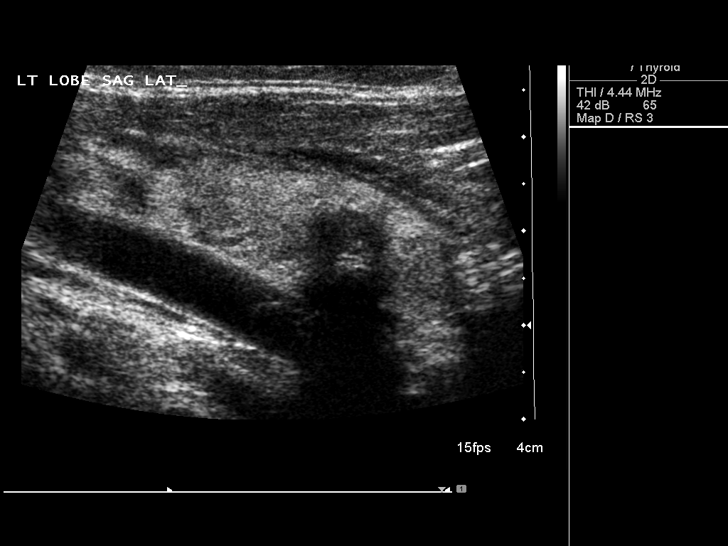
[im 37/55]
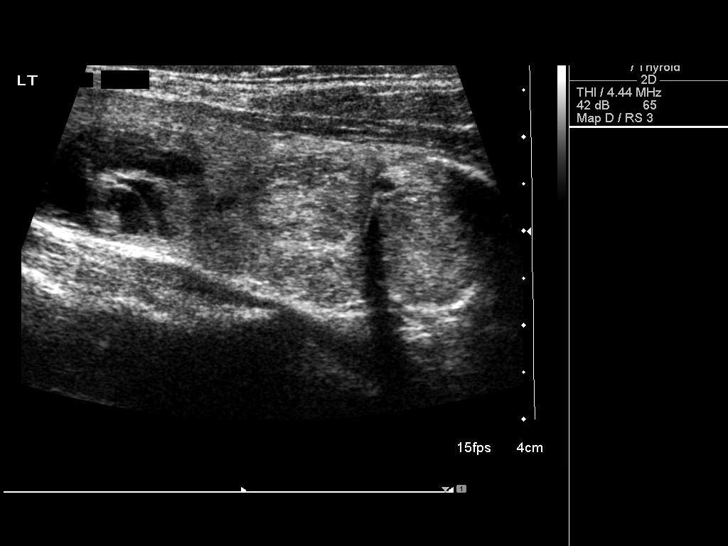
[im 41/55]
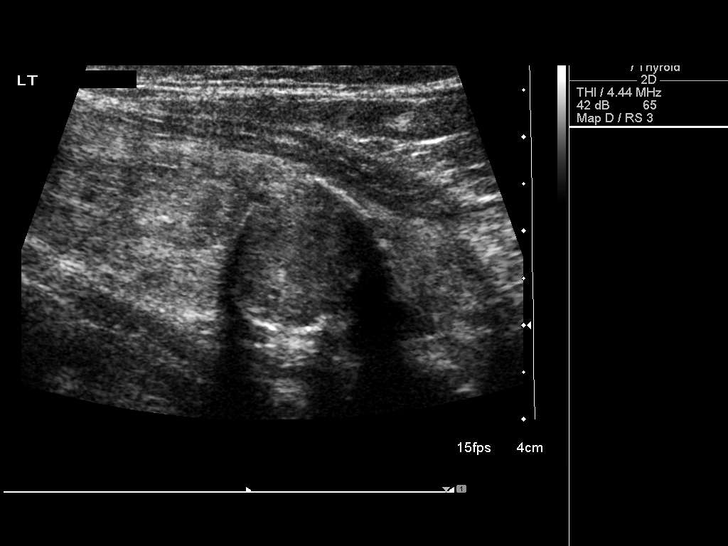
[im 46/55]
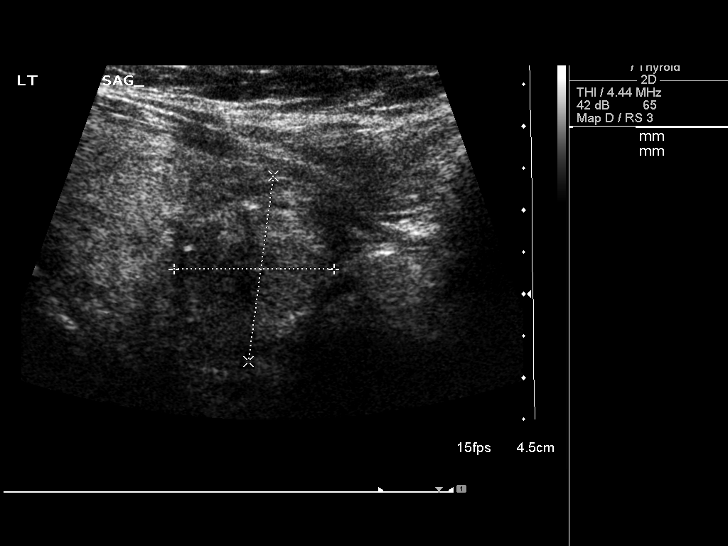
[im 50/55]
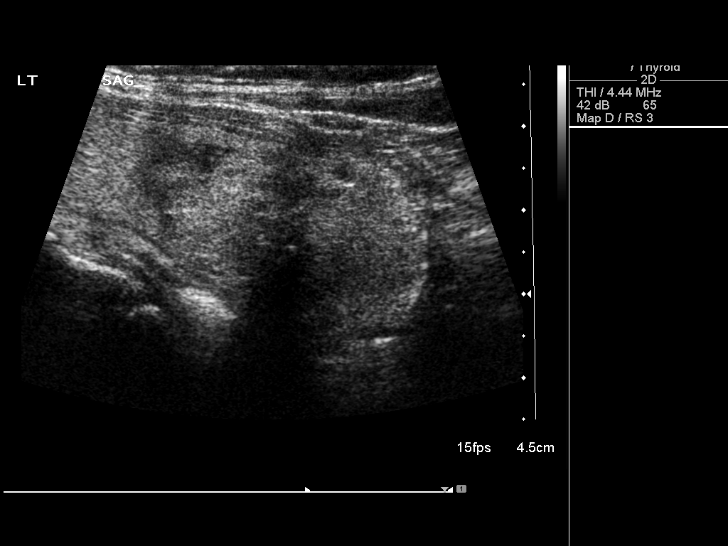
[im 55/55]
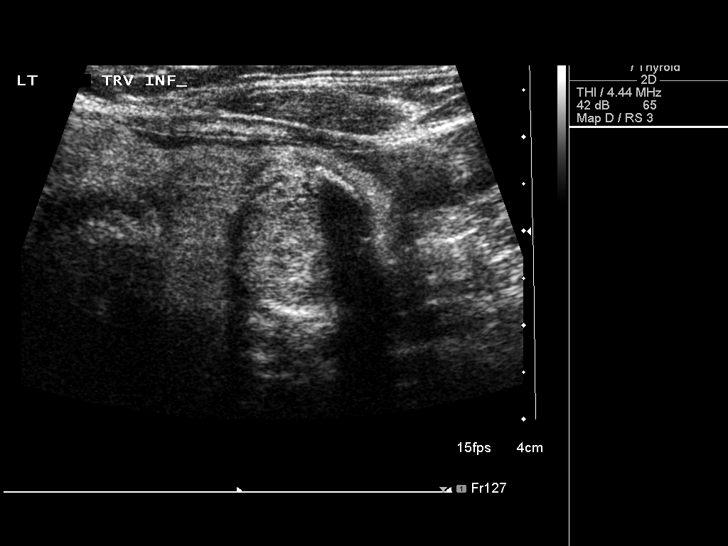

[13 of 25 positions shown; findings below may reference images not displayed]

FINDINGS: Right thyroid lobe:  6.1 x 2.3 x 2.5 cm
Left thyroid lobe:  8.0 x 2.9 x 2.7 cm
Isthmus:  5 mm.

Focal nodules:  Thyroid echotexture is diffusely heterogeneous.
Numerous nodules of varying complexity are identified.

-An isthmic nodule measures 1.8 x 0.8 x  1.4 cm, including on image
5.  This measured 1.5 x 0.9 x 1.4 cm on the prior.  This suggest
stability.  This is solid without calcification.

-The largest right-sided nodule is in the upper pole and measures
1.6 x 1.2 x 1.3 cm, including on image 14.  This is partially solid
partially cystic.  On the prior exam, this measured 1.4 x 1.2 x
cm.  This suggest stability versus minimal enlargement.  Faint
calcifications suggested within.

-An interpolar right-sided nodule measures 1.6 x 1.2 x 1.5 cm,
including on image 16.  This is also partially solid and partially
cystic.  On the prior exam, this measured 1.5 x 1.0 x 1.5 cm.  This
suggest stability versus minimal enlargement.

-The largest left-sided lesion is in the lower pole.  This measures
2.2 x 1.9 x 1.9 cm.  This is solid with peripheral calcification.
This appears enlarged from the prior, where it measured 1.7 x 1.7 x
1.7 cm.

-The second largest left-sided lesion is in the upper pole.  This
measures 1.9 x 1.2 x -1.6 cm, including on image 35.  This is
partially solid partially cystic.  On the prior exam, this measures
1.6 x 1.0 x 1.6 cm.  This suggests minimal enlargement.

Lymphadenopathy:  None visualized.
IMPRESSION: Persistent thyromegaly with numerous nodules, as detailed above.
Some appear of to have undergone minimal interval enlargement.
Appearance is most consistent with multinodular goiter. Ultrasound
surveillance should be considered.  If tissue sampling is desired,
the dominant upper pole right-sided nodule would be an option (
given interval enlargement and faint calcifications within)..

## 2011-07-14 ENCOUNTER — Telehealth (INDEPENDENT_AMBULATORY_CARE_PROVIDER_SITE_OTHER): Payer: Self-pay | Admitting: General Surgery

## 2011-07-14 NOTE — Telephone Encounter (Signed)
I CONTACTED Laura Underwood RE HER ULTRASOUND RESULTS AND F/U APPT WITH DR. Luisa Hart IN 6 MONTHS ALONG WITH F/U U/S OF NECK/GY

## 2011-09-24 LAB — HM PAP SMEAR: HM PAP: NEGATIVE

## 2011-10-25 ENCOUNTER — Telehealth (INDEPENDENT_AMBULATORY_CARE_PROVIDER_SITE_OTHER): Payer: Self-pay | Admitting: Surgery

## 2011-10-26 ENCOUNTER — Other Ambulatory Visit (INDEPENDENT_AMBULATORY_CARE_PROVIDER_SITE_OTHER): Payer: Self-pay

## 2011-10-26 DIAGNOSIS — E042 Nontoxic multinodular goiter: Secondary | ICD-10-CM

## 2011-12-13 ENCOUNTER — Ambulatory Visit
Admission: RE | Admit: 2011-12-13 | Discharge: 2011-12-13 | Disposition: A | Discharge status: Home / Self Care | Payer: BC Managed Care – PPO | Source: Ambulatory Visit | Attending: Surgery | Admitting: Surgery

## 2011-12-13 DIAGNOSIS — E042 Nontoxic multinodular goiter: Secondary | ICD-10-CM

## 2011-12-13 IMAGING — US US SOFT TISSUE HEAD/NECK
1 series · 13 of 25 positions shown · non-contrast
Comparison: Ultrasound of the thyroid of 07/13/2011

CLINICAL DATA: Thyroid goiter, follow-up

THYROID ULTRASOUND
TECHNIQUE: Ultrasound examination of the thyroid gland and adjacent
soft tissues was performed.

[Series 1: us soft tissue head/neck · 0.05mm/px · 13 of 79 slices shown]
[im 1/79]
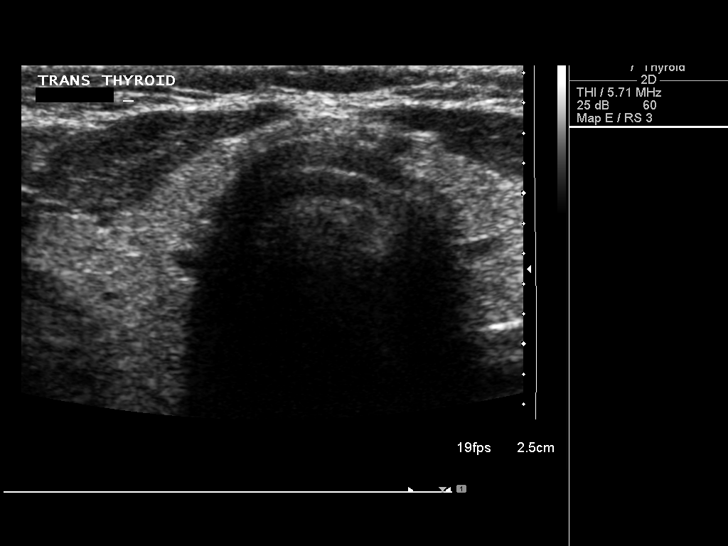
[im 7/79]
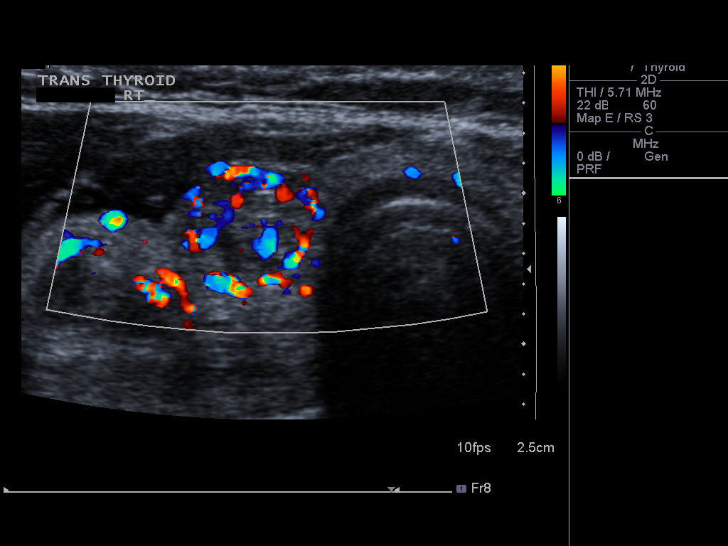
[im 14/79]
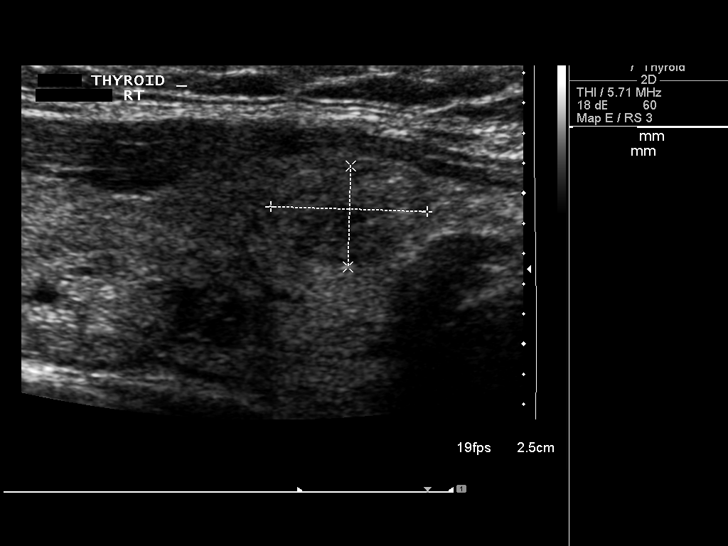
[im 20/79]
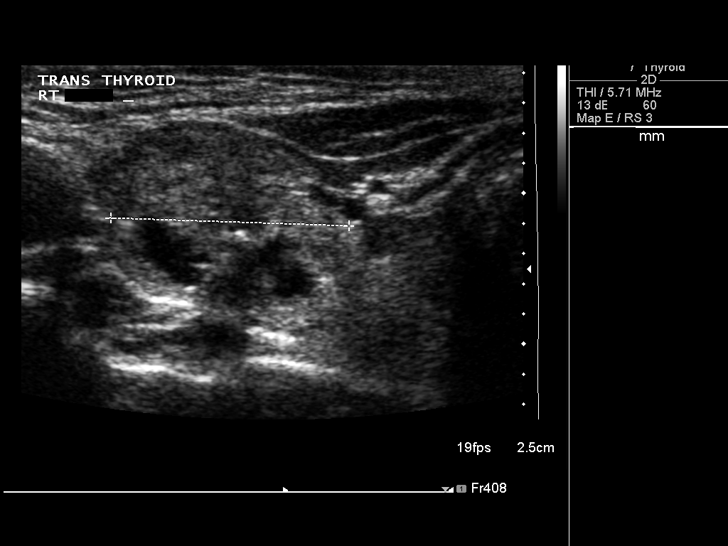
[im 27/79]
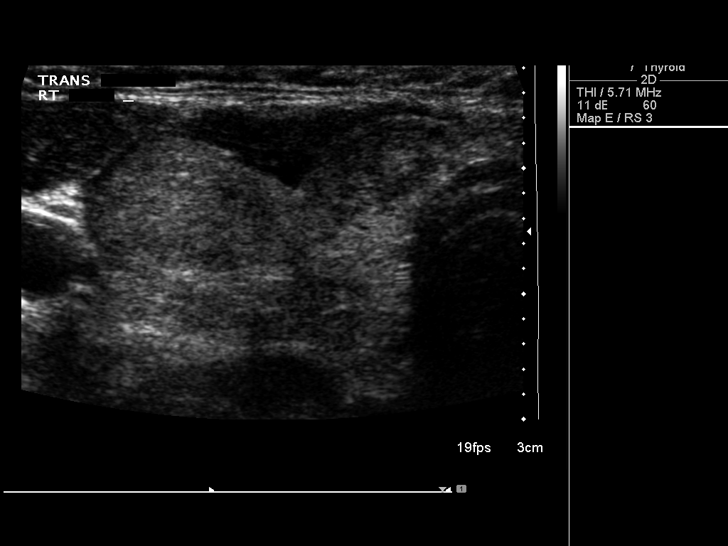
[im 33/79]
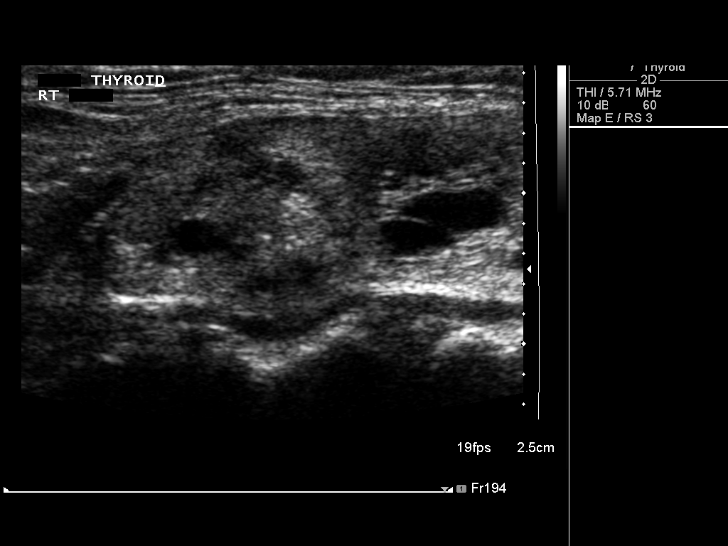
[im 40/79]
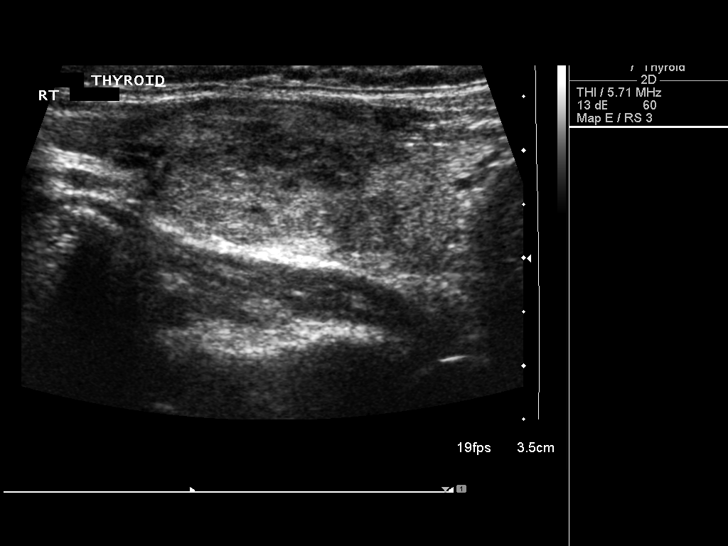
[im 46/79]
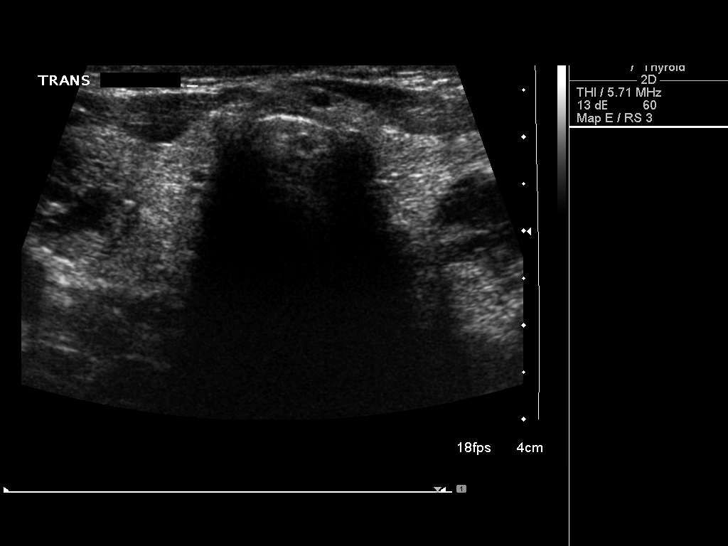
[im 53/79]
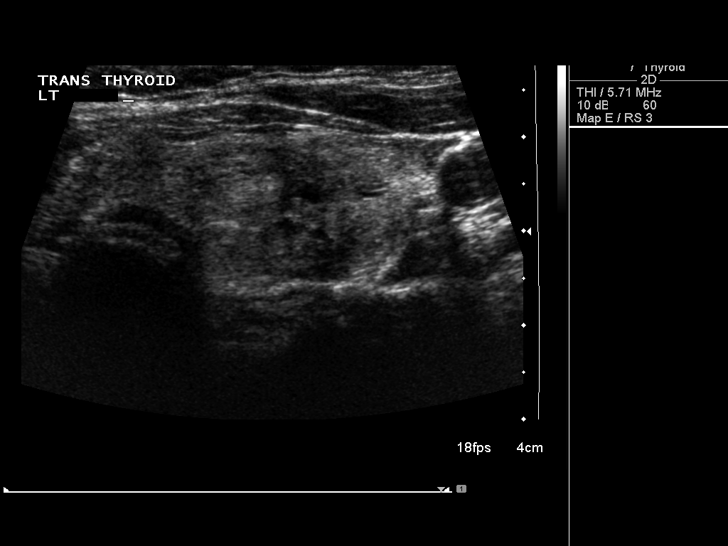
[im 59/79]
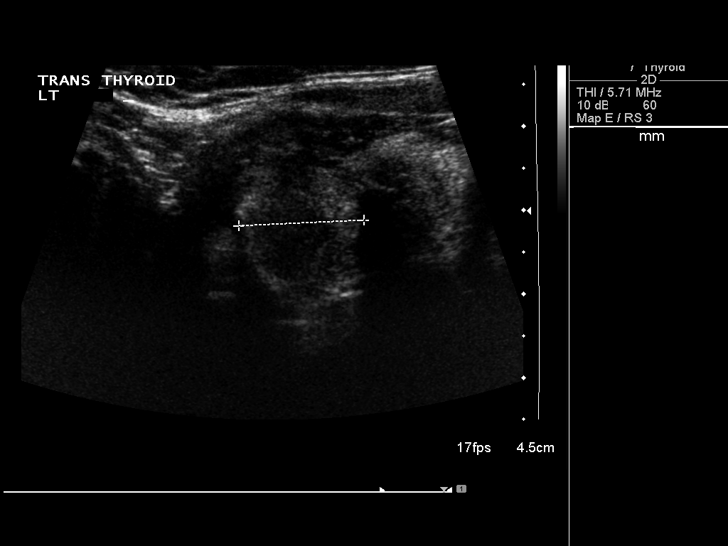
[im 66/79]
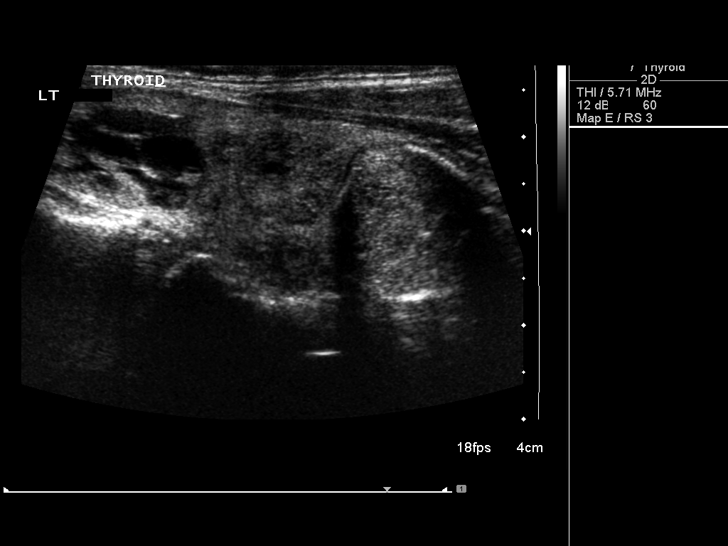
[im 72/79]
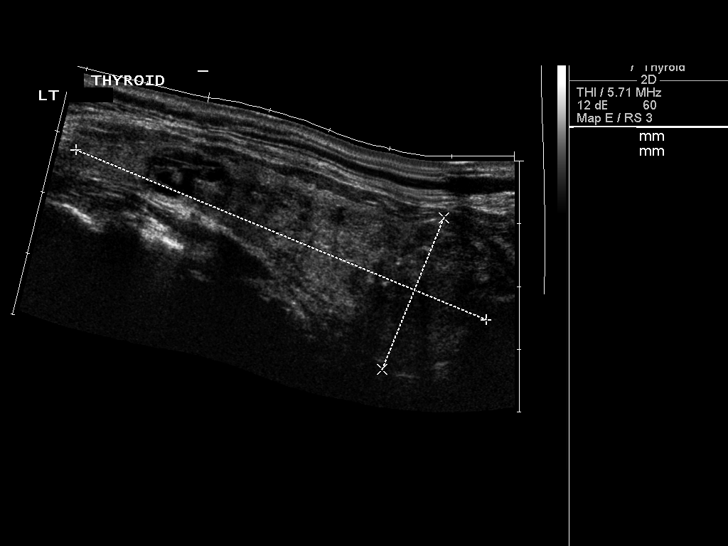
[im 79/79]
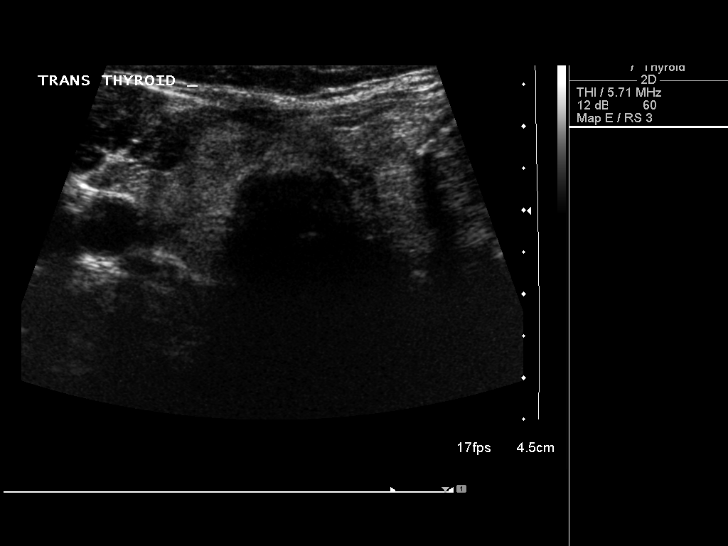

[13 of 25 positions shown; findings below may reference images not displayed]

FINDINGS: Right thyroid lobe:  6.3 x 1.7 x 2.6 cm.  (Previously 6.1 x 2.3 x
2.5 cm).
Left thyroid lobe:  7.8 x 2.7 x 2.2 cm.  (Previously 8.0 x 2.9 x
2.7 cm).
Isthmus:  2.9 mm in thickness.

Focal nodules:  Again there are multiple thyroid nodules
bilaterally.  On the right, the largest nodule which is primarily
solid with coarse calcifications is noted in the upper pole
measuring 1.5 x 1.0 x 1.6 cm compared to prior measurements of
x 1.2 x 1.3 cm.  A nodule in the mid right lobe is solid and cystic
measuring 1.6 x 1.1 x 1.4 cm compared to prior measurements of
x 1.2 x 1.5 cm.  A nodule in the right isthmus is solid measuring
1.9 x 0.9 x 1.1 cm compared to prior measurements of 1.8 x 0.8 x
1.4 cm.

On the left the largest solid nodule is in the lower pole medially
measuring 1.9 x 2.4 x 1.6 cm compared to prior measurements of
x 2.2 x 1.9 cm.  A solid nodule in the lower pole with
calcification measures 1.7 x 1.6 x 1.7 cm compared to prior
measurements of 1.7 x 1.7 x 1.7 cm.  A cystic and solid nodule in
the upper pole of the left lobe measures 1.6 x 1.2 x 1.5 cm
compared to prior measurements of 1.9 x 1.2 x 1.6 cm.

The left lower pole nodule does extend beneath the clavicle making
accurate measurement somewhat difficult.

Lymphadenopathy:  None visualized.
IMPRESSION: Relatively stable multinodular goiter.  No definite
enlarging nodule is seen.

## 2011-12-14 ENCOUNTER — Ambulatory Visit (INDEPENDENT_AMBULATORY_CARE_PROVIDER_SITE_OTHER): Payer: BC Managed Care – PPO | Admitting: Surgery

## 2011-12-14 ENCOUNTER — Encounter (INDEPENDENT_AMBULATORY_CARE_PROVIDER_SITE_OTHER): Payer: Self-pay | Admitting: Surgery

## 2011-12-14 VITALS — BP 130/88 | HR 79 | Temp 97.6°F | Resp 14 | Ht 65.0 in | Wt 197.0 lb

## 2011-12-14 DIAGNOSIS — E049 Nontoxic goiter, unspecified: Secondary | ICD-10-CM

## 2011-12-14 NOTE — Patient Instructions (Signed)
Goiter  Goiter is an enlarged thyroid gland. The thyroid gland sits at the base of the front of the neck. The gland produces hormones that regulate mood, body temperature, pulse rate, and digestion. Most goiters are painless and are not a cause for serious concern. Goiters and conditions that cause goiters can be treated if necessary.    CAUSES    Common causes of goiter include:   Graves disease (causes too much hormone to be produced [hyperthyroidism]).   Hashimoto's disease (causes too little hormone to be produced [hypothyroidism]).   Thyroiditis (inflammation of the thyroid sometimes caused by virus or pregnancy).   Nodular goiter (small bumps form; sometimes called toxic nodular goiter).   Pregnancy.   Thyroid cancer (very few goiters with nodules are cancerous).   Certain medications.   Radiation exposure.   Iodine deficiency (more common in developing countries in inland populations).  RISK FACTORS  Risk factors for goiter include:   A family history of goiter.   Female gender.   Inadequate iodine in the diet.   Age older than 40 years.  SYMPTOMS    Many goiters do not cause symptoms. When symptoms do occur, they may include:   Swelling in the lower part of the neck. This swelling can range from a very small bump to a large lump.   A tight feeling in the throat.   A hoarse voice.  Less commonly, a goiter may result in:   Coughing.   Wheezing.   Difficulty swallowing.   Difficulty breathing.   Bulging neck veins.   Dizziness.  When a goiter is the result of hyperthyroidism, symptoms may include:   Rapid or irregular heart beat.   Sicknessin your stomach (nausea).   Vomiting.   Diarrhea.   Shaking.   Irritable feeling.   Bulging eyes.   Weight loss.   Heat sensitivity.   Anxiety.  When a goiter is the result of hypothyroidism, symptoms may include:   Tiredness.   Dry skin.   Constipation.   Weight gain.   Irregular menstrual cycle.   Depressed mood.    Sensitivity to cold.  DIAGNOSIS    Tests used to diagnose goiter include:   A physical exam.   Blood tests, including thyroid hormone levels and antibody testing.   Ultrasonography, computerized X-ray scan (computed tomography, CT) or computerized magnetic scan (magnetic resonance imaging, MRI).   Thyroid scan (imaging along with safe radioactive injection).   Tissue sample taken (biopsy) of nodules. This is sometimes done to confirm that the nodules are not cancerous.  TREATMENT    Treatment will depend on the cause of the goiter. Treatment may include:   Monitoring. In some cases, no treatment is necessary, and your doctor will monitor yourcondition at regular check ups.   Medications and supplements. Thyroid medication (thyroid hormone replacement) is available for hyperthroidism and hypothyroidism.   If inflammation is the cause, over-the-counter medication or steroid medication may be recommended.   Goiters caused by iodine deficiency can be treated with iodine supplements or changes in diet.   Radioactive iodine treatment. Radioactive iodine is injected into the blood. It travels to the thyroid gland, kills thyroid cells, and reduces the size of the gland. This is only used when the thyroid gland is overactive. Lifelong thyroid hormone medication is often necessary after this treatment.   Surgery. A procedure to remove all or part of the gland may be recommended in severe cases or when cancer is the cause. Hormones can be taken   to replace the hormones normally produced by the thyroid.  HOME CARE INSTRUCTIONS     Take medications as directed.   Follow your caregiver's recommendations for any dietary changes.   Follow up with your caregiver for further examination and testing, as directed.  PREVENTION     If you have a family history of goiter, discuss screening with your doctor.   Make sure you are getting enough iodine in your diet.    Use of iodized table salt can help prevent iodine deficiency.  Document Released: 11/18/2009 Document Revised: 05/20/2011 Document Reviewed: 11/18/2009  [employer] Patient Information 2012 [employer].

## 2011-12-14 NOTE — Progress Notes (Signed)
Subjective:     Patient ID: Laura Underwood, female   DOB: 1950-11-02, 61 y.o.   MRN: [id number]  HPIPatient returns today for followup of her multiply nodular goiter. She has no complaints.   Review of Systems  Constitutional: Negative.   Respiratory: Positive for cough.   Neurological: Negative for tremors.       Objective:   Physical Exam  Constitutional: She appears well-developed and well-nourished.  HENT:  Head: Normocephalic and atraumatic.  Neck: Normal range of motion. Neck supple. Thyromegaly present.  Cardiovascular: Normal rate and regular rhythm.   Pulmonary/Chest: Effort normal and breath sounds normal.  Lymphadenopathy:    She has no cervical adenopathy.   Soft tissue neck ultrasound  12/13/2011  No change to multinodular goiter    Assessment:     Multinodular goiter stable    Plan:     Return  6 months.  U/S in 6 months.  Check for recent thyroid function  Studies.

## 2012-02-09 ENCOUNTER — Other Ambulatory Visit: Payer: Self-pay | Admitting: Internal Medicine

## 2012-02-09 DIAGNOSIS — M810 Age-related osteoporosis without current pathological fracture: Secondary | ICD-10-CM

## 2012-02-18 ENCOUNTER — Ambulatory Visit
Admission: RE | Admit: 2012-02-18 | Discharge: 2012-02-18 | Disposition: A | Discharge status: Home / Self Care | Payer: BC Managed Care – PPO | Source: Ambulatory Visit | Attending: Internal Medicine | Admitting: Internal Medicine

## 2012-02-18 DIAGNOSIS — M810 Age-related osteoporosis without current pathological fracture: Secondary | ICD-10-CM

## 2012-04-15 LAB — HM MAMMOGRAPHY: HM Mammogram: NORMAL

## 2012-05-16 ENCOUNTER — Ambulatory Visit
Admission: RE | Admit: 2012-05-16 | Discharge: 2012-05-16 | Disposition: A | Discharge status: Home / Self Care | Payer: BC Managed Care – PPO | Source: Ambulatory Visit | Attending: Surgery | Admitting: Surgery

## 2012-05-16 DIAGNOSIS — E049 Nontoxic goiter, unspecified: Secondary | ICD-10-CM

## 2012-05-16 IMAGING — US US SOFT TISSUE HEAD/NECK
1 series · 13 of 25 positions shown · non-contrast
Comparison: 12/13/2011

CLINICAL DATA: Follow-up of multinodular thyroid goiter.

THYROID ULTRASOUND
TECHNIQUE: Ultrasound examination of the thyroid gland and adjacent
soft tissues was performed.

[Series 1: us soft tissue head/neck · 0.08mm/px · 13 of 55 slices shown]
[im 1/55]
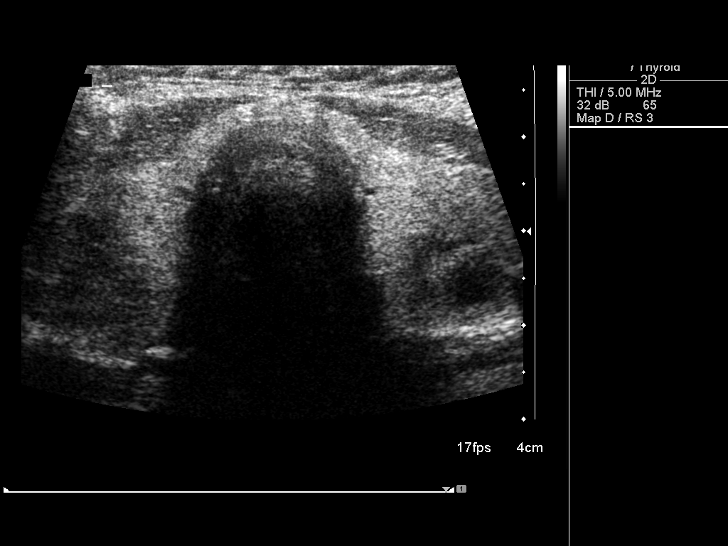
[im 5/55]
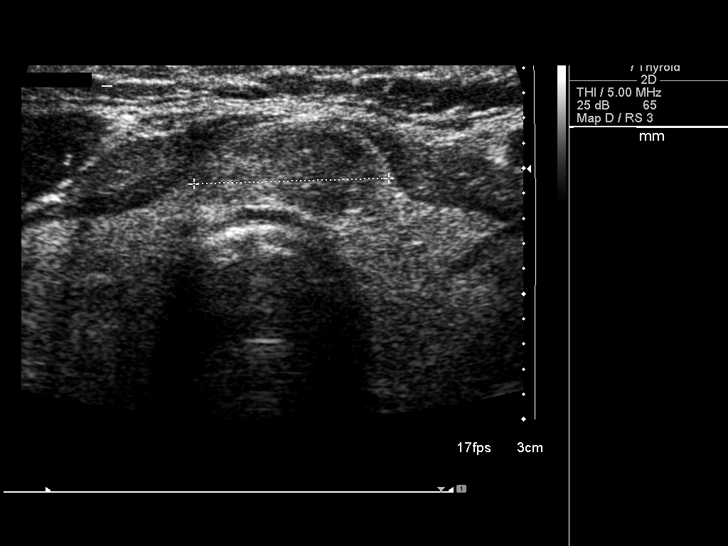
[im 10/55]
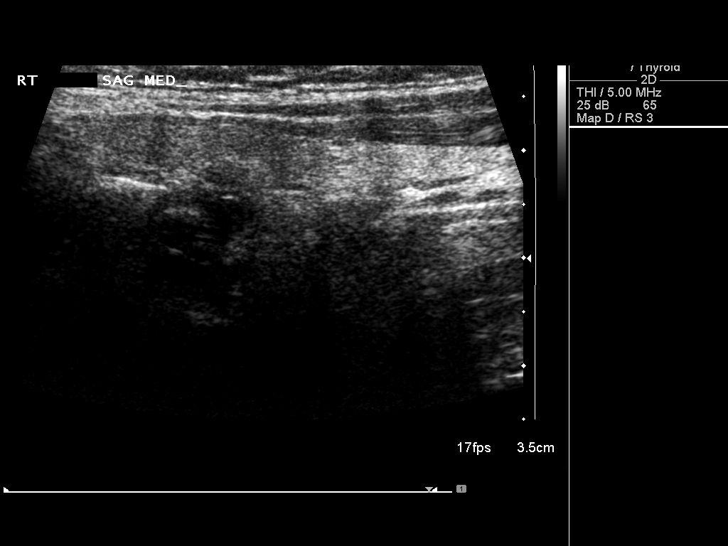
[im 14/55]
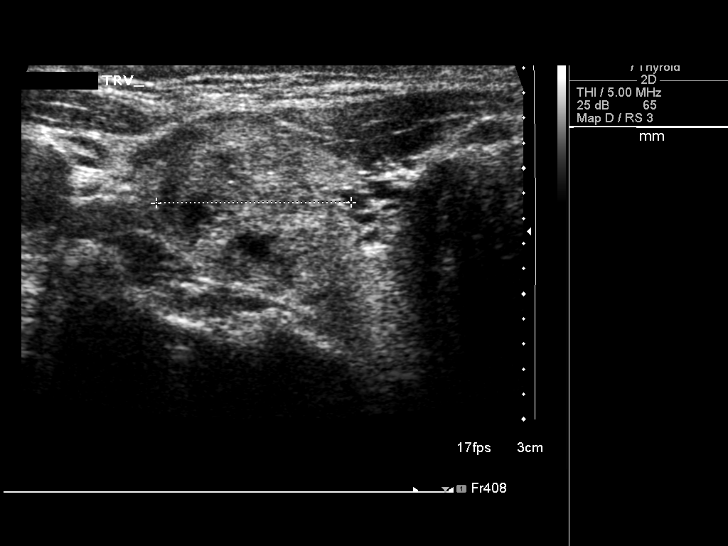
[im 19/55]
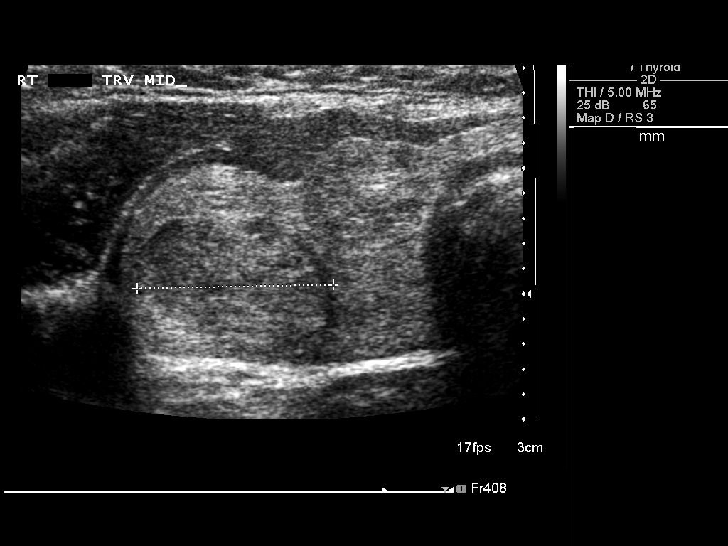
[im 23/55]
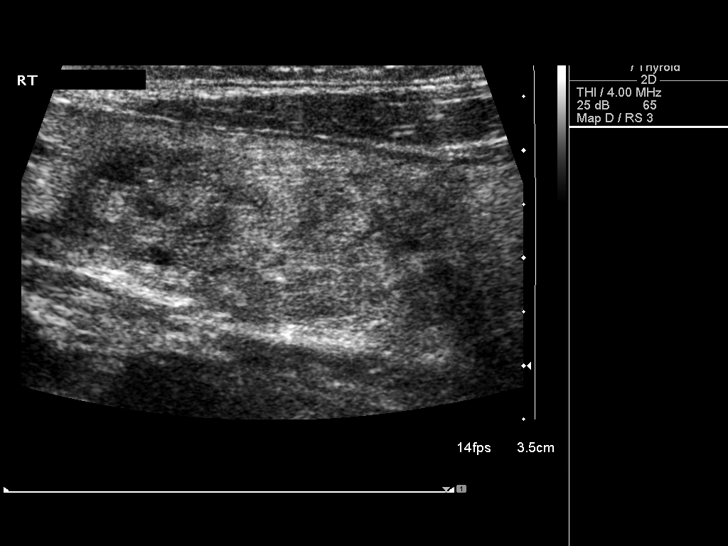
[im 28/55]
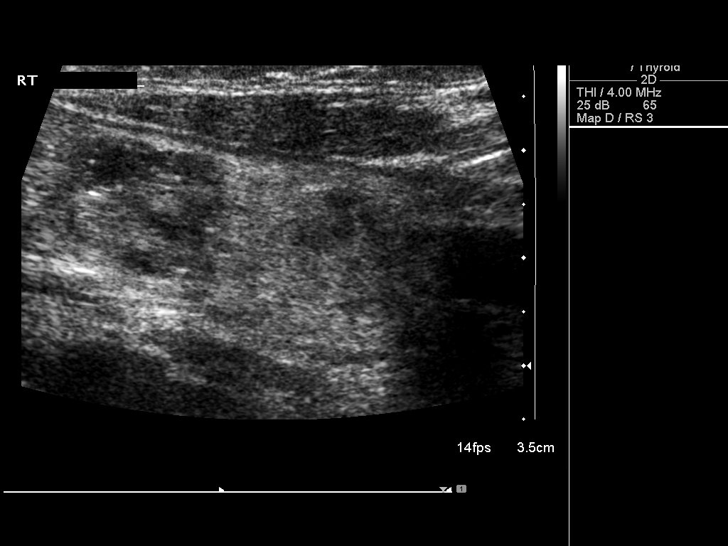
[im 32/55]
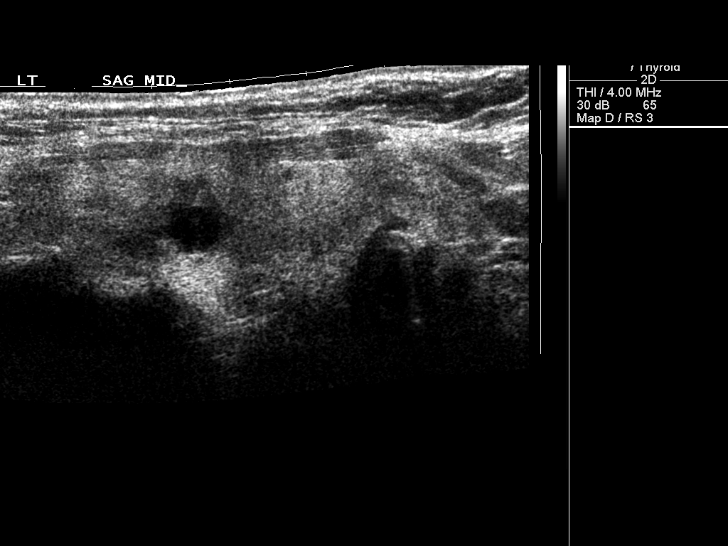
[im 37/55]
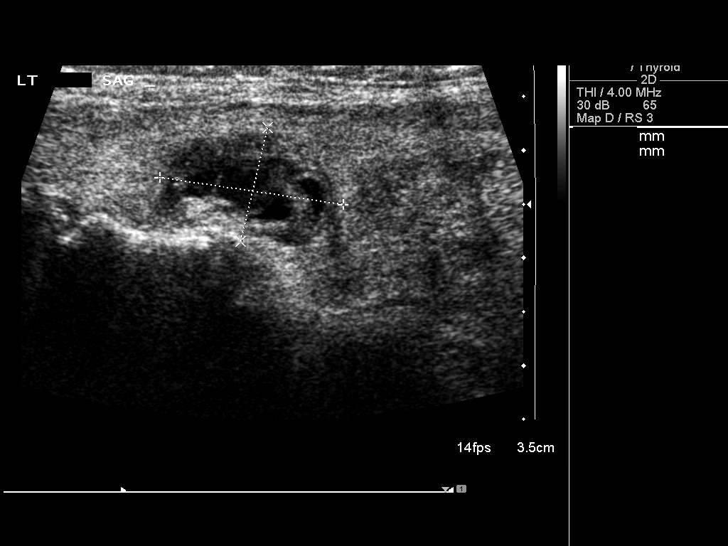
[im 41/55]
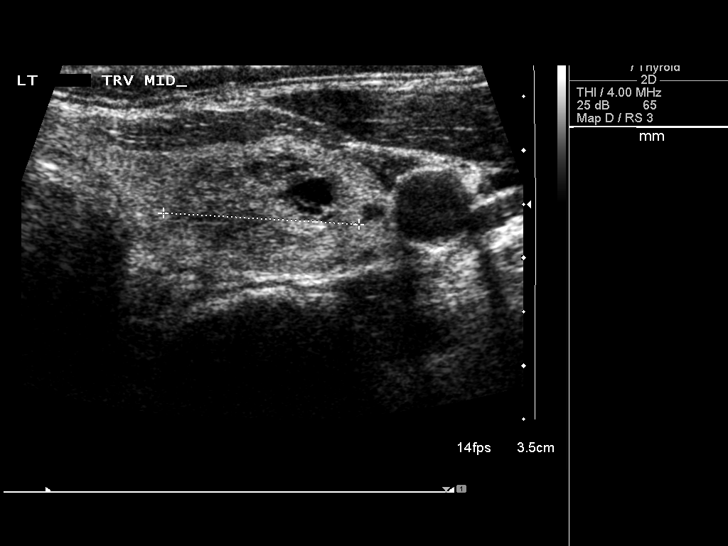
[im 46/55]
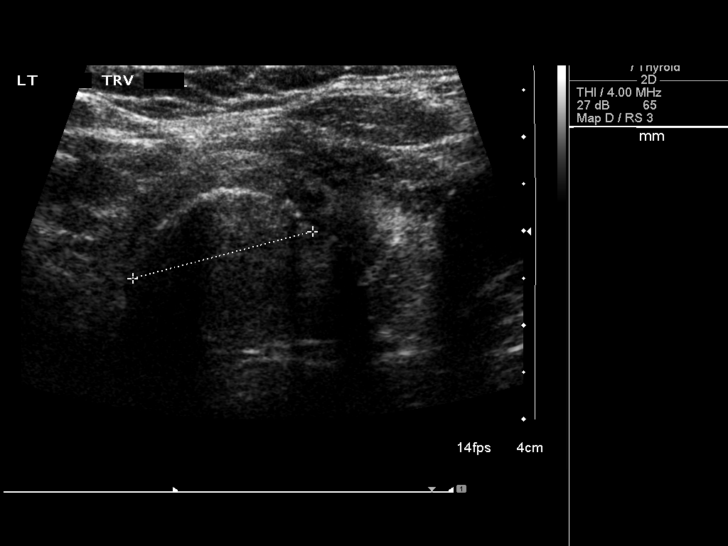
[im 50/55]
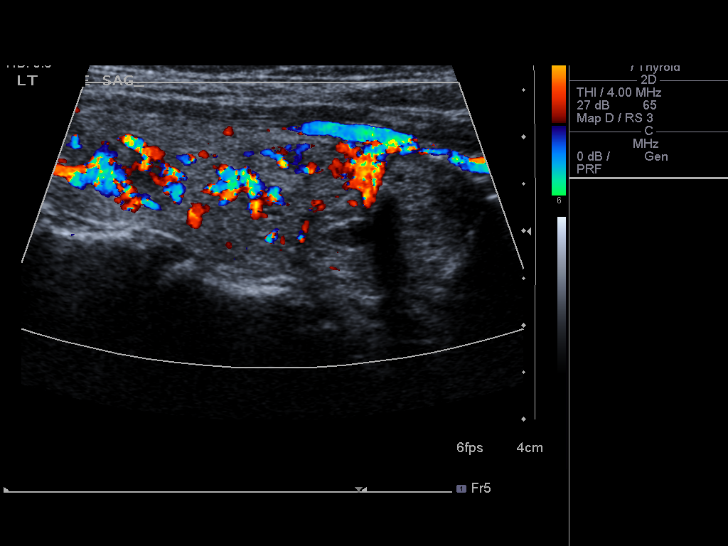
[im 55/55]
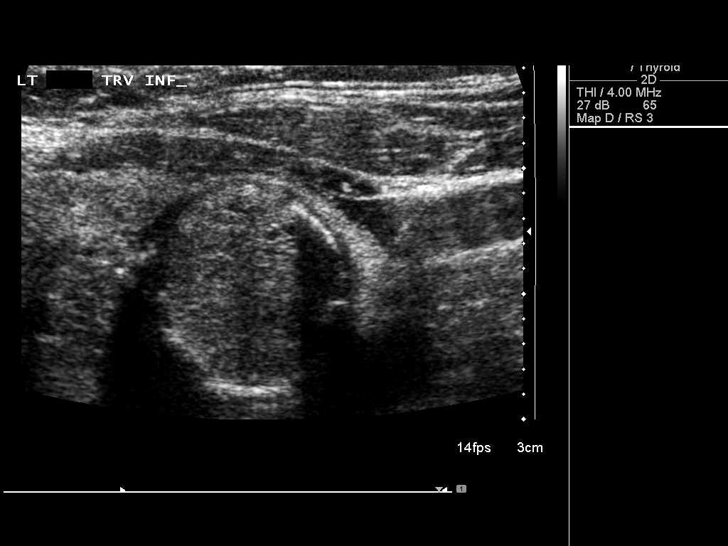

[13 of 25 positions shown; findings below may reference images not displayed]

FINDINGS: Right thyroid lobe:  6.2 x 2.0 x 2.4 cm
Left thyroid lobe:  8.1 x 3.0 x 2.2 cm
Isthmus:  0.4 cm

Thyroid gland dimensions and volume are stable.

Focal nodules:  A multitude of bilateral solid and cystic nodules
are again demonstrated .  Solid nodule in the isthmus is stable and
measures approximately 1.9 x 0.7 x 1.6 cm.  A superior right nodule
is stable and measures 1.6 x 1.2 x 1.6 cm.  The nodule in the
midportion of the right lobe is stable and measures approximately
1.7 x 1.3 x 1.6 cm.  This continues to show partial cystic
components.  A nodule in the inferior right lobe is identified
measuring 1.9 x 1.3 x 1.6 cm.  This does not appear to have been
measured previously, but was likely present and stable.

The largest left sided nodule measuring 2.4 cm in greatest diameter
previously is slightly less prominent in appearance and measures
approximately 1.9 x 2.1 x 2.0 cm currently.  Three other measurable
solid nodules in the left lobe are all stable in size and
morphology with maximal diameters of 1.7, 1.8 and 1.7 cm
respectively.

Lymphadenopathy:  None visualized.
IMPRESSION: No significant change in the appearance of multinodular thyroid
goiter.  One of the left-sided nodules may be slightly smaller in
size.  All other demonstrated solid and partially cystic nodules
are stable in size and appearance by ultrasound.

## 2012-05-29 DIAGNOSIS — K219 Gastro-esophageal reflux disease without esophagitis: Secondary | ICD-10-CM | POA: Insufficient documentation

## 2012-06-16 ENCOUNTER — Encounter (INDEPENDENT_AMBULATORY_CARE_PROVIDER_SITE_OTHER): Payer: Self-pay | Admitting: Surgery

## 2012-06-16 ENCOUNTER — Ambulatory Visit (INDEPENDENT_AMBULATORY_CARE_PROVIDER_SITE_OTHER): Payer: BC Managed Care – PPO | Admitting: Surgery

## 2012-06-16 VITALS — BP 138/88 | HR 76 | Temp 97.6°F | Resp 14 | Ht 65.0 in | Wt 198.6 lb

## 2012-06-16 DIAGNOSIS — E049 Nontoxic goiter, unspecified: Secondary | ICD-10-CM

## 2012-06-16 NOTE — Patient Instructions (Signed)
Goiter  Goiter is an enlarged thyroid gland. The thyroid gland sits at the base of the front of the neck. The gland produces hormones that regulate mood, body temperature, pulse rate, and digestion. Most goiters are painless and are not a cause for serious concern. Goiters and conditions that cause goiters can be treated if necessary.   CAUSES   Common causes of goiter include:   Graves disease (causes too much hormone to be produced [hyperthyroidism]).   Hashimoto's disease (causes too little hormone to be produced [hypothyroidism]).   Thyroiditis (inflammation of the thyroid sometimes caused by virus or pregnancy).   Nodular goiter (small bumps form; sometimes called toxic nodular goiter).   Pregnancy.   Thyroid cancer (very few goiters with nodules are cancerous).   Certain medications.   Radiation exposure.   Iodine deficiency (more common in developing countries in inland populations).  RISK FACTORS  Risk factors for goiter include:   A family history of goiter.   Female gender.   Inadequate iodine in the diet.   Age older than 40 years.  SYMPTOMS   Many goiters do not cause symptoms. When symptoms do occur, they may include:   Swelling in the lower part of the neck. This swelling can range from a very small bump to a large lump.   A tight feeling in the throat.   A hoarse voice.  Less commonly, a goiter may result in:   Coughing.   Wheezing.   Difficulty swallowing.   Difficulty breathing.   Bulging neck veins.   Dizziness.  When a goiter is the result of hyperthyroidism, symptoms may include:   Rapid or irregular heart beat.   Sicknessin your stomach (nausea).   Vomiting.   Diarrhea.   Shaking.   Irritable feeling.   Bulging eyes.   Weight loss.   Heat sensitivity.   Anxiety.  When a goiter is the result of hypothyroidism, symptoms may include:   Tiredness.   Dry skin.   Constipation.   Weight gain.   Irregular menstrual cycle.   Depressed mood.   Sensitivity to cold.   DIAGNOSIS   Tests used to diagnose goiter include:   A physical exam.   Blood tests, including thyroid hormone levels and antibody testing.   Ultrasonography, computerized X-ray scan (computed tomography, CT) or computerized magnetic scan (magnetic resonance imaging, MRI).   Thyroid scan (imaging along with safe radioactive injection).   Tissue sample taken (biopsy) of nodules. This is sometimes done to confirm that the nodules are not cancerous.  TREATMENT   Treatment will depend on the cause of the goiter. Treatment may include:   Monitoring. In some cases, no treatment is necessary, and your doctor will monitor yourcondition at regular check ups.   Medications and supplements. Thyroid medication (thyroid hormone replacement) is available for hyperthroidism and hypothyroidism.   If inflammation is the cause, over-the-counter medication or steroid medication may be recommended.   Goiters caused by iodine deficiency can be treated with iodine supplements or changes in diet.   Radioactive iodine treatment. Radioactive iodine is injected into the blood. It travels to the thyroid gland, kills thyroid cells, and reduces the size of the gland. This is only used when the thyroid gland is overactive. Lifelong thyroid hormone medication is often necessary after this treatment.   Surgery. A procedure to remove all or part of the gland may be recommended in severe cases or when cancer is the cause. Hormones can be taken to replace the hormones normally   produced by the thyroid.  HOME CARE INSTRUCTIONS    Take medications as directed.   Follow your caregiver's recommendations for any dietary changes.   Follow up with your caregiver for further examination and testing, as directed.  PREVENTION    If you have a family history of goiter, discuss screening with your doctor.   Make sure you are getting enough iodine in your diet.   Use of iodized table salt can help prevent iodine deficiency.   Document Released: 11/18/2009 Document Revised: 08/23/2011 Document Reviewed: 11/18/2009  [employer] Patient Information 2013 [employer].

## 2012-06-16 NOTE — Progress Notes (Signed)
Subjective:     Patient ID: Laura Underwood, female   DOB: 01/14/1951, 62 y.o.   MRN: [id number]  HPIPatient returns today for followup of her multiply nodular goiter. She has no complaints.   Review of Systems  Constitutional: Negative.   Respiratory: Positive for cough.   Neurological: Negative for tremors.       Objective:   Physical Exam  Constitutional: She appears well-developed and well-nourished.  HENT:  Head: Normocephalic and atraumatic.  Neck: Normal range of motion. Neck supple. Thyromegaly present.  Cardiovascular: Normal rate and regular rhythm.   Pulmonary/Chest: Effort normal and breath sounds normal.  Lymphadenopathy:    She has no cervical adenopathy.  Findings:  Right thyroid lobe: 6.2 x 2.0 x 2.4 cm  Left thyroid lobe: 8.1 x 3.0 x 2.2 cm  Isthmus: 0.4 cm  Thyroid gland dimensions and volume are stable.  Focal nodules: A multitude of bilateral solid and cystic nodules  are again demonstrated . Solid nodule in the isthmus is stable and  measures approximately 1.9 x 0.7 x 1.6 cm. A superior right nodule  is stable and measures 1.6 x 1.2 x 1.6 cm. The nodule in the  midportion of the right lobe is stable and measures approximately  1.7 x 1.3 x 1.6 cm. This continues to show partial cystic  components. A nodule in the inferior right lobe is identified  measuring 1.9 x 1.3 x 1.6 cm. This does not appear to have been  measured previously, but was likely present and stable.  The largest left sided nodule measuring 2.4 cm in greatest diameter  previously is slightly less prominent in appearance and measures  approximately 1.9 x 2.1 x 2.0 cm currently. Three other measurable  solid nodules in the left lobe are all stable in size and  morphology with maximal diameters of 1.7, 1.8 and 1.7 cm  respectively.  Lymphadenopathy: None visualized.  IMPRESSION:  No significant change in the appearance of multinodular thyroid  goiter. One of the left-sided nodules may be  slightly smaller in  size. All other demonstrated solid and partially cystic nodules  are stable in size and appearance by ultrasound.   Soft tissue neck ultrasound  05/16/2012  No change to multinodular goiter    Assessment:     Multinodular goiter stable    Plan:     Return  6 months.  U/S in 6 months.

## 2012-07-29 ENCOUNTER — Other Ambulatory Visit: Payer: Self-pay

## 2012-10-13 ENCOUNTER — Encounter: Payer: Self-pay | Admitting: Internal Medicine

## 2012-10-13 ENCOUNTER — Ambulatory Visit (INDEPENDENT_AMBULATORY_CARE_PROVIDER_SITE_OTHER): Payer: BC Managed Care – PPO | Admitting: Internal Medicine

## 2012-10-13 VITALS — BP 132/90 | HR 77 | Temp 98.3°F | Ht 65.0 in | Wt 196.0 lb

## 2012-10-13 DIAGNOSIS — R682 Dry mouth, unspecified: Secondary | ICD-10-CM | POA: Insufficient documentation

## 2012-10-13 DIAGNOSIS — R739 Hyperglycemia, unspecified: Secondary | ICD-10-CM | POA: Insufficient documentation

## 2012-10-13 DIAGNOSIS — E042 Nontoxic multinodular goiter: Secondary | ICD-10-CM

## 2012-10-13 DIAGNOSIS — K117 Disturbances of salivary secretion: Secondary | ICD-10-CM

## 2012-10-13 DIAGNOSIS — K219 Gastro-esophageal reflux disease without esophagitis: Secondary | ICD-10-CM

## 2012-10-13 DIAGNOSIS — H04129 Dry eye syndrome of unspecified lacrimal gland: Secondary | ICD-10-CM

## 2012-10-13 DIAGNOSIS — R05 Cough: Secondary | ICD-10-CM

## 2012-10-13 DIAGNOSIS — H04123 Dry eye syndrome of bilateral lacrimal glands: Secondary | ICD-10-CM | POA: Insufficient documentation

## 2012-10-13 DIAGNOSIS — R7309 Other abnormal glucose: Secondary | ICD-10-CM

## 2012-10-13 LAB — CBC WITH DIFFERENTIAL/PLATELET
Basophils Relative: 0.5 % (ref 0.0–3.0)
Eosinophils Absolute: 0.1 10*3/uL (ref 0.0–0.7)
Lymphocytes Relative: 29 % (ref 12.0–46.0)
Lymphs Abs: 1.8 10*3/uL (ref 0.7–4.0)
MCHC: 32.9 g/dL (ref 30.0–36.0)
Monocytes Absolute: 0.3 10*3/uL (ref 0.1–1.0)
Neutro Abs: 3.8 10*3/uL (ref 1.4–7.7)
WBC: 6 10*3/uL (ref 4.5–10.5)

## 2012-10-13 LAB — SEDIMENTATION RATE: Sed Rate: 11 mm/hr (ref 0–22)

## 2012-10-13 NOTE — Assessment & Plan Note (Signed)
Chronic cough s/p evaluation by pulmonary medicine and allergy. Suspect symptoms related to GERD and possibly chronic dry mouth. Question if she may have Sjogren's Syndrome. Will check ANA, antibodies for Sjogren's today. Follow up 2 weeks.

## 2012-10-13 NOTE — Assessment & Plan Note (Signed)
Dry eyes and dry mouth suggestive of Sjogren's. Will check ANA, and antibodies for Sjogrens with labs today.

## 2012-10-13 NOTE — Assessment & Plan Note (Signed)
Reflux symptoms improved on Omeprazole. Will continue. Will request records from previous providers. Discussed potentially asking for EGD with next colonoscopy (2015) if symptoms persistent.

## 2012-10-13 NOTE — Assessment & Plan Note (Signed)
Exam normal today. Will check TSH with labs.

## 2012-10-13 NOTE — Assessment & Plan Note (Signed)
Dry mouth suggestive of Sjorgren's. Will check ANA and Sjorgren's antibodies with labs today. Continue use of artificial saliva.

## 2012-10-13 NOTE — Assessment & Plan Note (Signed)
Fasting BG checked by pt typically 90-120. Will check A1c with labs.

## 2012-10-13 NOTE — Progress Notes (Signed)
Subjective:    Patient ID: Laura Underwood, female    DOB: 1951/02/09, 62 y.o.   MRN: [id number]  HPI 62 year old female with history of chronic cough and goiter presents to establish care. She reports that she has had ongoing issues with try a chronic cough over the last couple of years. She has been evaluated by both pulmonary medicine and allergy medicine and most likely etiology was determined to be acid reflux. She has been placed on amitriptyline and gabapentin as well as twice daily omeprazole with minimal improvement in her symptoms. She has had laryngoscopy but no previous upper endoscopy. She reports ongoing symptoms of dry mouth which requires use of artificial saliva on a frequent basis. She also notes dry eyes. She has a history of a dental abscess which may have occurred as a result of dry mouth. Aside from this, she generally reports she has been feeling well. She denies any change in appetite, difficulty swallowing, nausea, vomiting, abdominal pain. She denies any change in bowel habits. She is up-to-date on mammogram, colonoscopy, and Pap smear.  She also notes that she recently went to a lecture about diabetes. After that lecture, she started checking her blood sugars. Her blood sugars have generally been between 80 and 120 fasting. She questions whether she may have diabetes. She reports hemoglobin A1c in the past was near 6%.    Outpatient Encounter Prescriptions as of 10/13/2012  Medication Sig Dispense Refill  . amitriptyline (ELAVIL) 10 MG tablet At bedtime.      Marland Kitchen aspirin 81 MG tablet Take 81 mg by mouth daily. With calcium 777mg  daily       . beta carotene 15 MG capsule Take 15 mg by mouth daily.      Marland Kitchen CHERRY CONCENTRATE PO Take by mouth daily.        Tery Sanfilippo Sodium (COLACE PO) Take 2 tablets by mouth daily.       . fish oil-omega-3 fatty acids 1000 MG capsule Take 2,000 mg by mouth daily.       Marland Kitchen gabapentin (NEURONTIN) 100 MG capsule Three times a day.      . Iron TABS  Take 25 mg by mouth.      . loratadine (CLARITIN) 10 MG tablet Take 10 mg by mouth daily.        . Multiple Vitamin (MULTIVITAMIN) capsule Take 1 capsule by mouth daily.        Marland Kitchen omeprazole (PRILOSEC) 40 MG capsule Take 40 mg by mouth 2 (two) times daily.      . vitamin E 400 UNIT capsule Take 400 Units by mouth daily.        . calcium gluconate 500 MG tablet Take 500 mg by mouth daily.        . clotrimazole-betamethasone (LOTRISONE) cream Ad lib.       No facility-administered encounter medications on file as of 10/13/2012.   BP 132/90  Pulse 77  Temp(Src) 98.3 F (36.8 C) (Oral)  Ht 5\' 5"  (1.651 m)  Wt 196 lb (88.905 kg)  BMI 32.62 kg/m2  SpO2 99%  Review of Systems  Constitutional: Negative for fever, chills, appetite change, fatigue and unexpected weight change.  HENT: Negative for ear pain, congestion, sore throat, trouble swallowing, neck pain, voice change and sinus pressure.   Eyes: Negative for visual disturbance.  Respiratory: Negative for cough, shortness of breath, wheezing and stridor.   Cardiovascular: Negative for chest pain, palpitations and leg swelling.  Gastrointestinal: Negative for nausea, vomiting,  abdominal pain, diarrhea, constipation, blood in stool, abdominal distention and anal bleeding.  Genitourinary: Negative for dysuria and flank pain.  Musculoskeletal: Negative for myalgias, arthralgias and gait problem.  Skin: Negative for color change and rash.  Neurological: Negative for dizziness and headaches.  Hematological: Negative for adenopathy. Does not bruise/bleed easily.  Psychiatric/Behavioral: Negative for suicidal ideas, sleep disturbance and dysphoric mood. The patient is not nervous/anxious.        Objective:   Physical Exam  Constitutional: She is oriented to person, place, and time. She appears well-developed and well-nourished. No distress.  HENT:  Head: Normocephalic and atraumatic.  Right Ear: External ear normal.  Left Ear: External ear  normal.  Nose: Nose normal.  Mouth/Throat: Oropharynx is clear and moist. Mucous membranes are dry. No oropharyngeal exudate.  Eyes: Conjunctivae are normal. Pupils are equal, round, and reactive to light. Right eye exhibits no discharge. Left eye exhibits no discharge. No scleral icterus.  Neck: Normal range of motion. Neck supple. No tracheal deviation present. No thyromegaly present.  Cardiovascular: Normal rate, regular rhythm, normal heart sounds and intact distal pulses.  Exam reveals no gallop and no friction rub.   No murmur heard. Pulmonary/Chest: Effort normal and breath sounds normal. No accessory muscle usage. Not tachypneic. No respiratory distress. She has no decreased breath sounds. She has no wheezes. She has no rhonchi. She has no rales. She exhibits no tenderness.  Abdominal: Soft. Bowel sounds are normal. She exhibits no distension. There is no tenderness.  Musculoskeletal: Normal range of motion. She exhibits no edema and no tenderness.  Lymphadenopathy:    She has no cervical adenopathy.  Neurological: She is alert and oriented to person, place, and time. No cranial nerve deficit. She exhibits normal muscle tone. Coordination normal.  Skin: Skin is warm and dry. No rash noted. She is not diaphoretic. No erythema. No pallor.  Psychiatric: She has a normal mood and affect. Her behavior is normal. Judgment and thought content normal.          Assessment & Plan:

## 2012-10-14 LAB — RHEUMATOID FACTOR: Rhuematoid fact SerPl-aCnc: <10 IU/mL (ref <=14)

## 2012-10-16 ENCOUNTER — Other Ambulatory Visit: Payer: Self-pay | Admitting: Internal Medicine

## 2012-10-16 DIAGNOSIS — R768 Other specified abnormal immunological findings in serum: Secondary | ICD-10-CM

## 2012-10-16 LAB — ANTI-NUCLEAR AB-TITER (ANA TITER): ANA Titer 1: 1:40 {titer} — ABNORMAL HIGH

## 2012-10-16 LAB — COMPREHENSIVE METABOLIC PANEL
ALT: 26 U/L (ref 0–35)
AST: 24 U/L (ref 0–37)
Alkaline Phosphatase: 57 U/L (ref 39–117)
Chloride: 104 mEq/L (ref 96–112)
Creatinine, Ser: 0.7 mg/dL (ref 0.4–1.2)
Total Bilirubin: 0.5 mg/dL (ref 0.3–1.2)

## 2012-10-16 LAB — SJOGRENS SYNDROME-A EXTRACTABLE NUCLEAR ANTIBODY: SSA (Ro) (ENA) Antibody, IgG: 2 AU/mL (ref <30)

## 2012-10-16 LAB — SJOGRENS SYNDROME-B EXTRACTABLE NUCLEAR ANTIBODY: SSB (La) (ENA) Antibody, IgG: <1 AU/mL (ref <30)

## 2012-10-16 LAB — C-REACTIVE PROTEIN: CRP: <0.5 mg/dL (ref 0.5–20.0)

## 2012-10-27 ENCOUNTER — Ambulatory Visit (INDEPENDENT_AMBULATORY_CARE_PROVIDER_SITE_OTHER): Payer: BC Managed Care – PPO | Admitting: Internal Medicine

## 2012-10-27 ENCOUNTER — Encounter: Payer: Self-pay | Admitting: Internal Medicine

## 2012-10-27 VITALS — BP 118/80 | HR 80 | Temp 98.7°F | Wt 195.0 lb

## 2012-10-27 DIAGNOSIS — E119 Type 2 diabetes mellitus without complications: Secondary | ICD-10-CM

## 2012-10-27 DIAGNOSIS — R05 Cough: Secondary | ICD-10-CM

## 2012-10-27 NOTE — Assessment & Plan Note (Signed)
Lab Results  Component Value Date   HGBA1C 6.7* 10/13/2012   Reviewed recent labs including A1c which was 6.7%. Consistent with diabetes. Patient's record of blood sugars over the last few weeks mostly 90-110 fasting. Encouraged her to continue efforts at healthy diet and regular physical activity. Will plan to repeat A1c in 3 months. Discussed potentially starting metformin if blood sugars continue to be elevated.

## 2012-10-27 NOTE — Assessment & Plan Note (Signed)
Patient has chronic cough associated with dry mouth. Suspect Sjogren's syndrome. However, recent ANA was positive but specific testing for Sjogren's antibodies was negative. Rheumatology evaluation pending.

## 2012-10-27 NOTE — Progress Notes (Signed)
Subjective:    Patient ID: Laura Underwood, female    DOB: October 12, 1950, 62 y.o.   MRN: [id number]  HPI 62 year old female with history of elevated blood sugars, dry mouth, dry eyes, and chronic cough presents for followup. At her last visit she underwent lab work including A1c which was 6.7%. We discussed that this is consistent with diabetes. She has been monitoring her blood sugars and most blood sugars have been between 90 and 110 fasting. She has tried to limit her intake of carbohydrates to 45 g per day. She is also trying to increase her physical activity. She would prefer not to start medication if possible.  Symptoms of dry mouth and dry eyes are persistent. She continues to have intermittent dry cough. Recent labs showed positive ANA but testing for Sjogren's syndrome was negative.  Outpatient Encounter Prescriptions as of 10/27/2012  Medication Sig Dispense Refill  . amitriptyline (ELAVIL) 10 MG tablet At bedtime.      Marland Kitchen aspirin 81 MG tablet Take 81 mg by mouth daily. With calcium 777mg  daily       . beta carotene 15 MG capsule Take 15 mg by mouth daily.      . calcium gluconate 500 MG tablet Take 500 mg by mouth daily.        Marland Kitchen CHERRY CONCENTRATE PO Take by mouth daily.        Tery Sanfilippo Sodium (COLACE PO) Take 2 tablets by mouth daily.       . fish oil-omega-3 fatty acids 1000 MG capsule Take 2,000 mg by mouth daily.       Marland Kitchen gabapentin (NEURONTIN) 100 MG capsule Three times a day.      . Iron TABS Take 25 mg by mouth.      . loratadine (CLARITIN) 10 MG tablet Take 10 mg by mouth daily.        . Multiple Vitamin (MULTIVITAMIN) capsule Take 1 capsule by mouth daily.        Marland Kitchen omeprazole (PRILOSEC) 40 MG capsule Take 40 mg by mouth 2 (two) times daily.      . vitamin E 400 UNIT capsule Take 400 Units by mouth daily.        . clotrimazole-betamethasone (LOTRISONE) cream Ad lib.       No facility-administered encounter medications on file as of 10/27/2012.   BP 118/80  Pulse 80   Temp(Src) 98.7 F (37.1 C) (Oral)  Wt 195 lb (88.451 kg)  BMI 32.45 kg/m2  SpO2 99%  Review of Systems  Constitutional: Negative for fever, chills, appetite change, fatigue and unexpected weight change.  HENT: Negative for ear pain, congestion, sore throat, trouble swallowing, neck pain, voice change and sinus pressure.   Eyes: Negative for visual disturbance.  Respiratory: Positive for cough. Negative for shortness of breath, wheezing and stridor.   Cardiovascular: Negative for chest pain, palpitations and leg swelling.  Gastrointestinal: Negative for nausea, vomiting, abdominal pain, diarrhea, constipation, blood in stool, abdominal distention and anal bleeding.  Genitourinary: Negative for dysuria and flank pain.  Musculoskeletal: Negative for myalgias, arthralgias and gait problem.  Skin: Negative for color change and rash.  Neurological: Negative for dizziness and headaches.  Hematological: Negative for adenopathy. Does not bruise/bleed easily.  Psychiatric/Behavioral: Negative for suicidal ideas, sleep disturbance and dysphoric mood. The patient is not nervous/anxious.        Objective:   Physical Exam  Constitutional: She is oriented to person, place, and time. She appears well-developed and well-nourished. No distress.  HENT:  Head: Normocephalic and atraumatic.  Right Ear: External ear normal.  Left Ear: External ear normal.  Nose: Nose normal.  Mouth/Throat: Oropharynx is clear and moist. No oropharyngeal exudate.  Eyes: Conjunctivae are normal. Pupils are equal, round, and reactive to light. Right eye exhibits no discharge. Left eye exhibits no discharge. No scleral icterus.  Neck: Normal range of motion. Neck supple. No tracheal deviation present. No thyromegaly present.  Cardiovascular: Normal rate, regular rhythm, normal heart sounds and intact distal pulses.  Exam reveals no gallop and no friction rub.   No murmur heard. Pulmonary/Chest: Effort normal and breath  sounds normal. No accessory muscle usage. Not tachypneic. No respiratory distress. She has no decreased breath sounds. She has no wheezes. She has no rhonchi. She has no rales. She exhibits no tenderness.  Musculoskeletal: Normal range of motion. She exhibits no edema and no tenderness.  Lymphadenopathy:    She has no cervical adenopathy.  Neurological: She is alert and oriented to person, place, and time. No cranial nerve deficit. She exhibits normal muscle tone. Coordination normal.  Skin: Skin is warm and dry. No rash noted. She is not diaphoretic. No erythema. No pallor.  Psychiatric: She has a normal mood and affect. Her behavior is normal. Judgment and thought content normal.          Assessment & Plan:

## 2012-11-02 ENCOUNTER — Telehealth (INDEPENDENT_AMBULATORY_CARE_PROVIDER_SITE_OTHER): Payer: Self-pay | Admitting: General Surgery

## 2012-11-02 ENCOUNTER — Other Ambulatory Visit (INDEPENDENT_AMBULATORY_CARE_PROVIDER_SITE_OTHER): Payer: Self-pay

## 2012-11-02 DIAGNOSIS — E049 Nontoxic goiter, unspecified: Secondary | ICD-10-CM

## 2012-11-02 NOTE — Telephone Encounter (Signed)
Spoke with patient she is aware of appt for u/s on 12/04/12 at [address]  At 1pm  And [facility] visit with Dr Luisa Hart 12/25/12 at 11:50 am

## 2012-12-04 ENCOUNTER — Ambulatory Visit
Admission: RE | Admit: 2012-12-04 | Discharge: 2012-12-04 | Disposition: A | Discharge status: Home / Self Care | Payer: BC Managed Care – PPO | Source: Ambulatory Visit | Attending: Surgery | Admitting: Surgery

## 2012-12-04 DIAGNOSIS — E049 Nontoxic goiter, unspecified: Secondary | ICD-10-CM

## 2012-12-04 IMAGING — US US SOFT TISSUE HEAD/NECK
1 series · 14 of 25 positions shown · non-contrast
Comparison: 05/16/2012

CLINICAL DATA: Thyroid nodules

THYROID ULTRASOUND
TECHNIQUE: Ultrasound examination of the thyroid gland and adjacent
soft tissues was performed.

[Series 1: us soft tissue head/neck · 0.10mm/px · 14 of 98 slices shown]
[im 1/98]
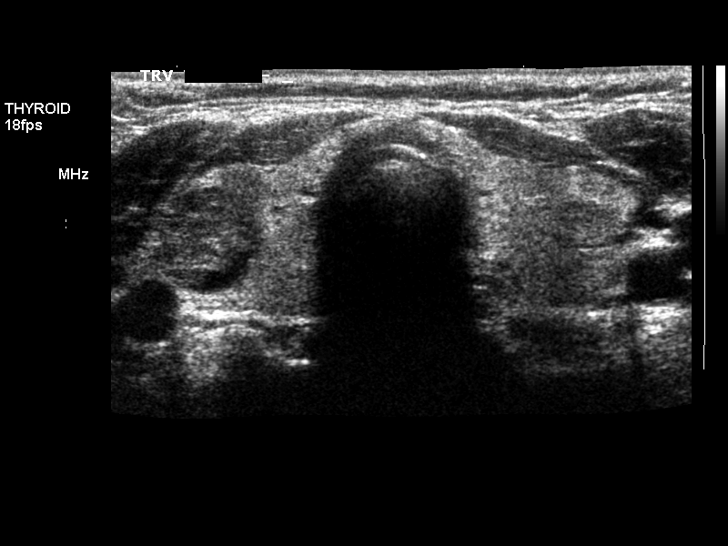
[im 9/98]
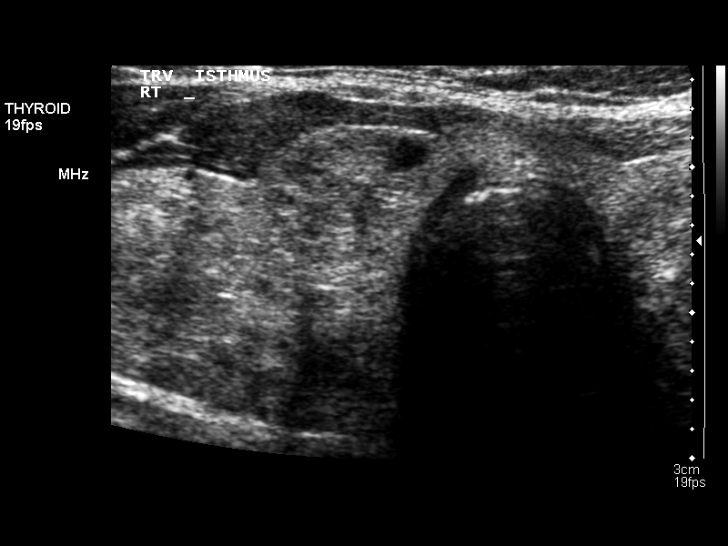
[im 17/98]
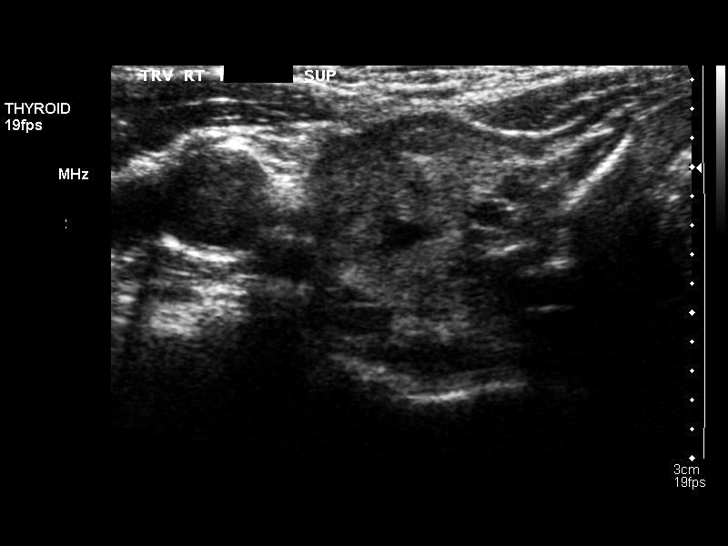
[im 25/98]
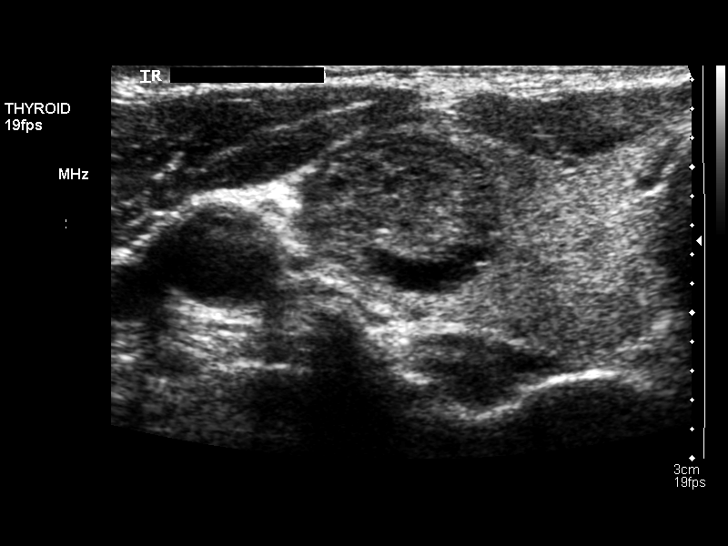
[im 33/98]
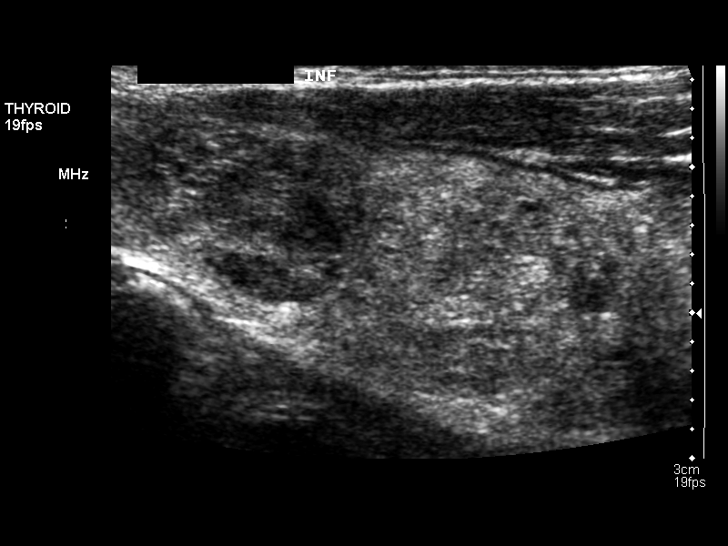
[im 37/98]
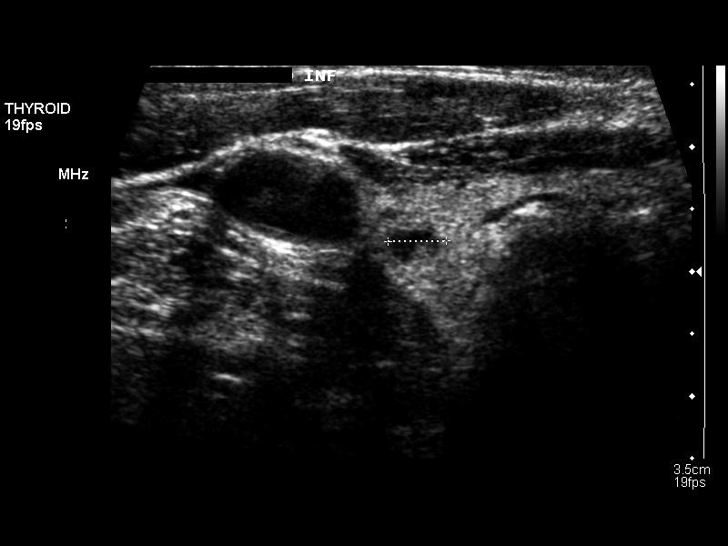
[im 45/98]
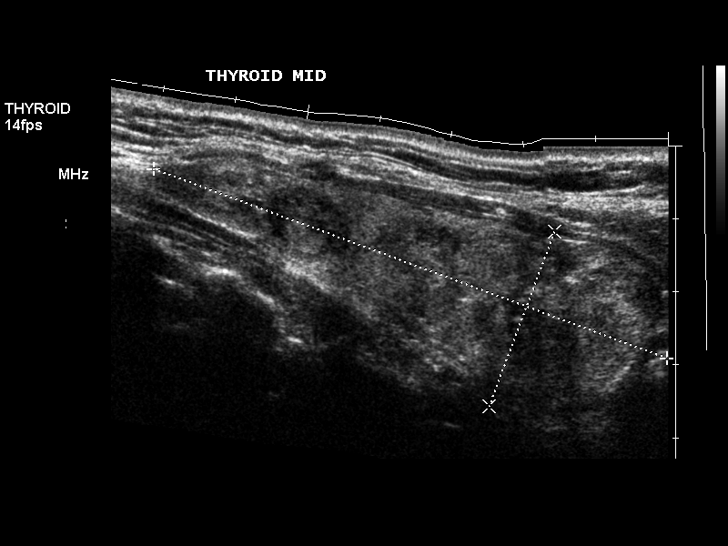
[im 53/98]
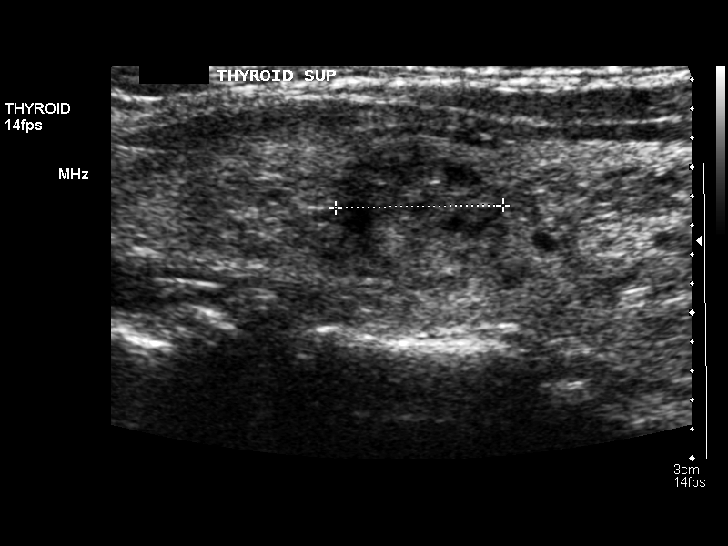
[im 61/98]
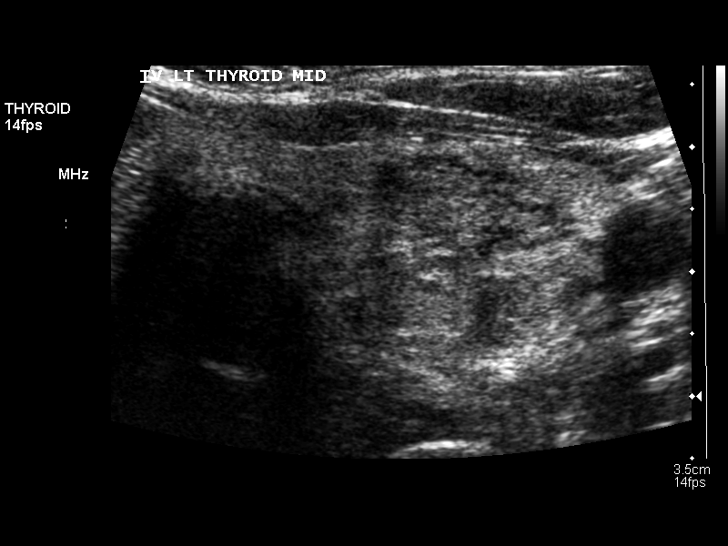
[im 65/98]
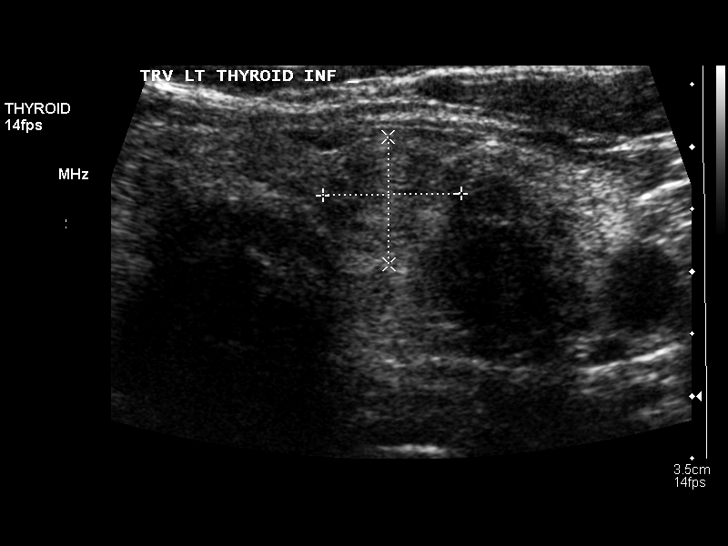
[im 73/98]
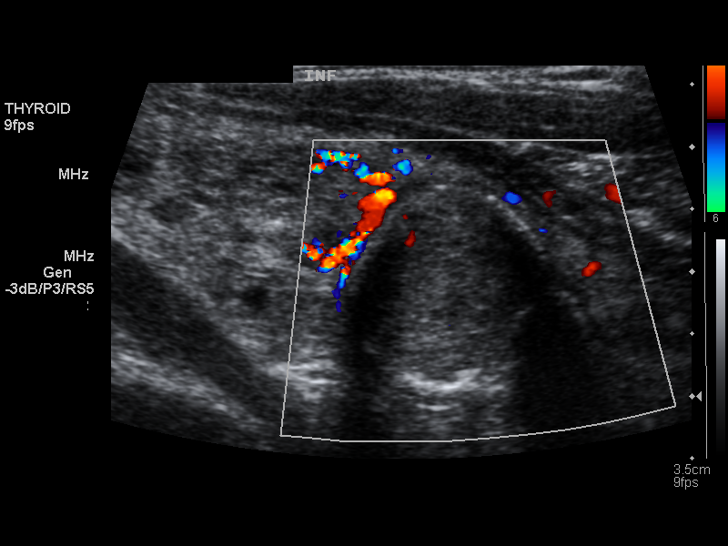
[im 81/98]
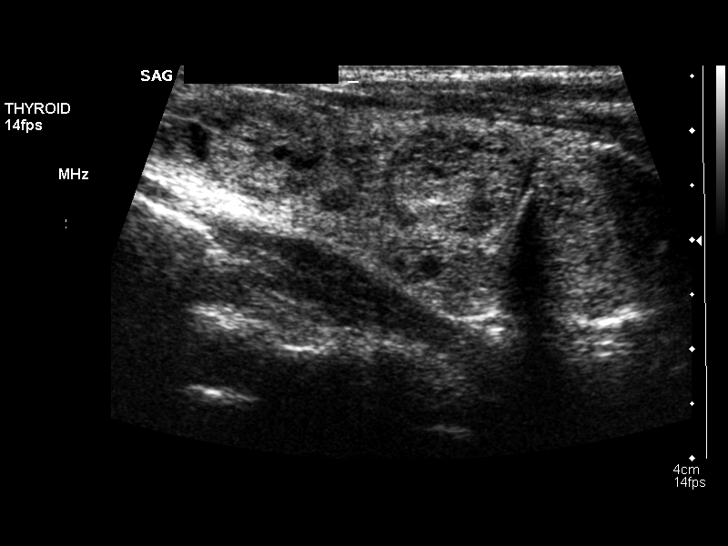
[im 89/98]
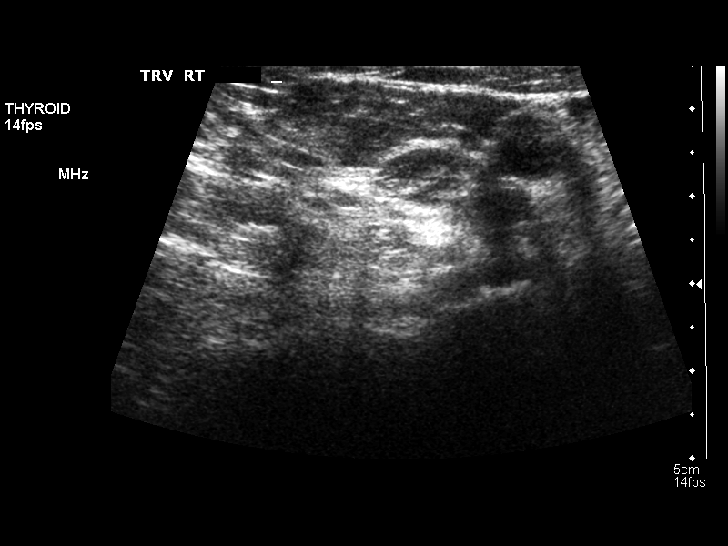
[im 98/98]
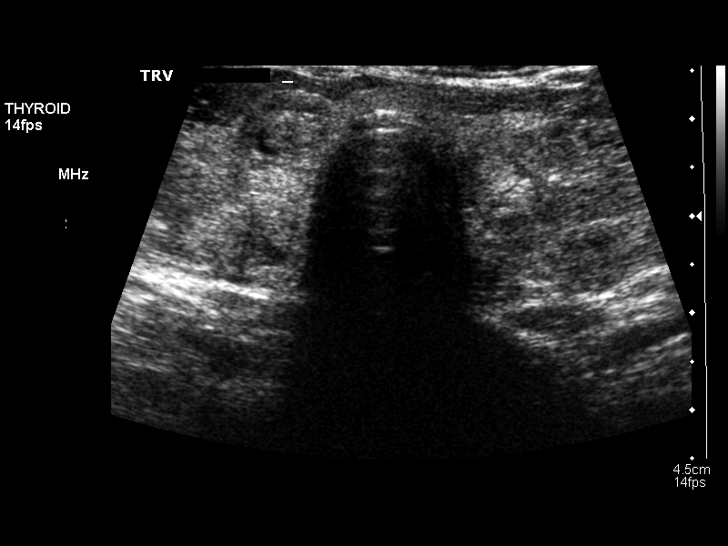

[14 of 25 positions shown; findings below may reference images not displayed]

FINDINGS: Right thyroid lobe:  17 x 26 x 59 mm, inhomogeneous echotexture
Left thyroid lobe:  22 x 26 x 75 mm
Isthmus:  4 x 1 mm in thickness

Focal nodules:  5 x 10 x 12 mm solid, right isthmus
9 x 14 x 19 mm solid, right isthmus (previously 7 x 16 x 19)
x 17)
10 x 12 x 13 mm solid, superior left (previously 17)
10 x 12 x 16 mm solid, mid-left (previously 18)
10 x 10 x 11 mm solid, inferior left (previously 17)
16 x 16 x 18 mm solid with coarse peripheral calcifications,
inferior left (previously 19 x 20 x 21)
At least five additional sub centimeter nodules are also noted
bilaterally.

Lymphadenopathy:  None visualized.
IMPRESSION: Multiple bilateral nodules, with questionable slight enlargement of
the inferior right dominant lesion.  Findings meet consensus
criteria for biopsy.  Ultrasound-guided fine needle aspiration
should be considered, as per the consensus statement: Management of
Thyroid Nodules Detected at US:  [employer] in
800.

## 2012-12-12 ENCOUNTER — Telehealth (INDEPENDENT_AMBULATORY_CARE_PROVIDER_SITE_OTHER): Payer: Self-pay

## 2012-12-12 DIAGNOSIS — E041 Nontoxic single thyroid nodule: Secondary | ICD-10-CM

## 2012-12-12 NOTE — Telephone Encounter (Signed)
Called patient with path results and told her we will set bx up for her and call her in the nest 1-2 days with tha appointment.

## 2012-12-12 NOTE — Telephone Encounter (Signed)
Message copied by Brennan Bailey on Tue Dec 12, 2012  1:29 PM ------      Message from: Harriette Bouillon A      Created: Mon Dec 11, 2012 12:31 PM       Needs biopsy set up ------

## 2012-12-20 ENCOUNTER — Ambulatory Visit
Admission: RE | Admit: 2012-12-20 | Discharge: 2012-12-20 | Disposition: A | Discharge status: Home / Self Care | Payer: BC Managed Care – PPO | Source: Ambulatory Visit | Attending: Surgery | Admitting: Surgery

## 2012-12-20 ENCOUNTER — Other Ambulatory Visit (HOSPITAL_COMMUNITY)
Admission: RE | Admit: 2012-12-20 | Discharge: 2012-12-20 | Disposition: A | Discharge status: Home / Self Care | Payer: BC Managed Care – PPO | Source: Ambulatory Visit | Attending: Interventional Radiology | Admitting: Interventional Radiology

## 2012-12-20 DIAGNOSIS — E041 Nontoxic single thyroid nodule: Secondary | ICD-10-CM

## 2012-12-20 DIAGNOSIS — E049 Nontoxic goiter, unspecified: Secondary | ICD-10-CM | POA: Insufficient documentation

## 2012-12-20 IMAGING — US US THYROID BIOPSY
1 series · 13 of 13 positions shown · non-contrast
Comparison: 12/04/2012

CLINICAL DATA: Dominant right inferior thyroid nodule

ULTRASOUND GUIDED NEEDLE ASPIRATE BIOPSY OF THE THYROID GLAND

[Series 1: us thyroid biopsy · 0.08mm/px · 13 acquisitions, 13 frames shown]
[im 1/13]
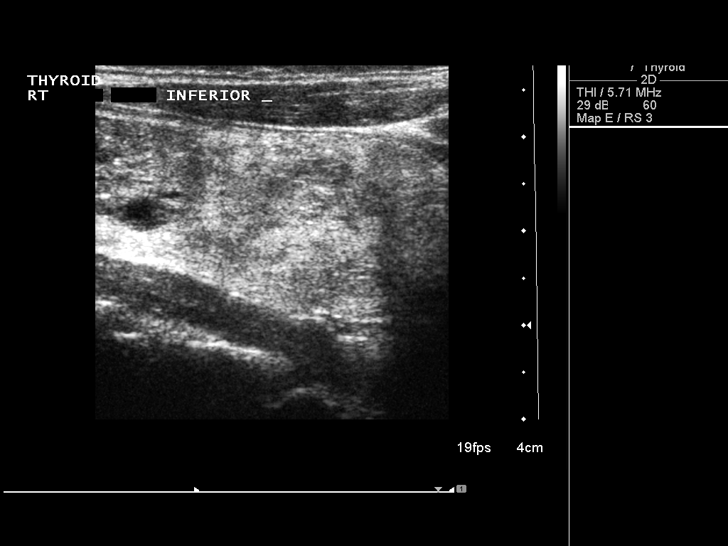
[im 2/13]
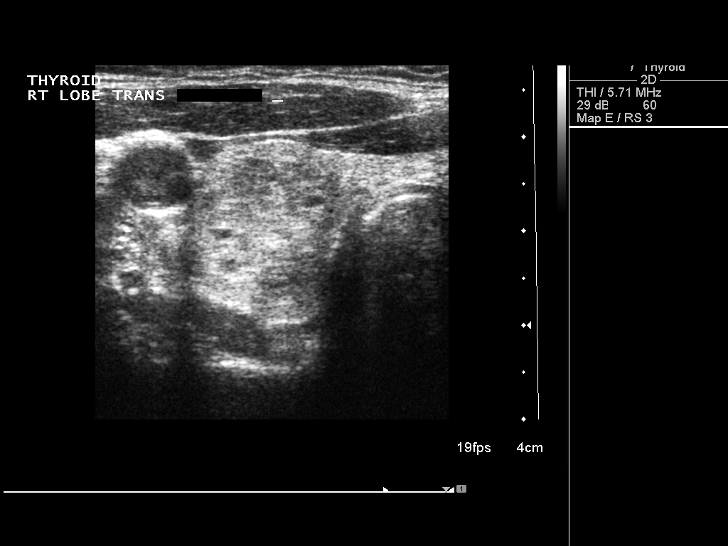
[im 3/13]
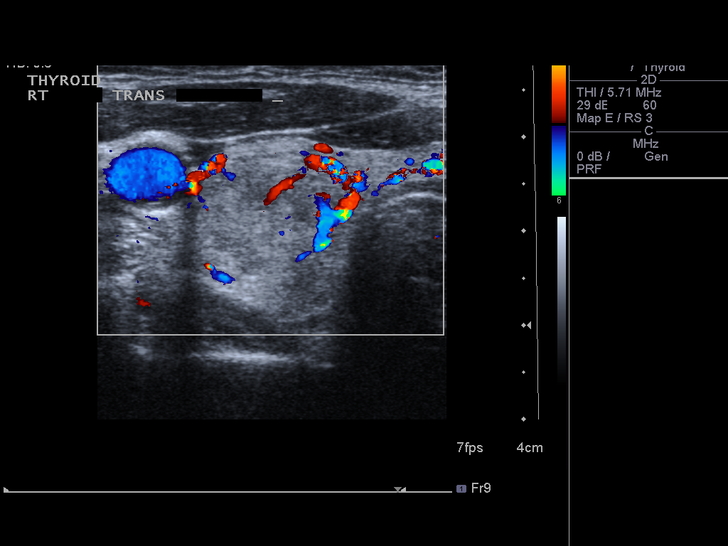
[im 4/13]
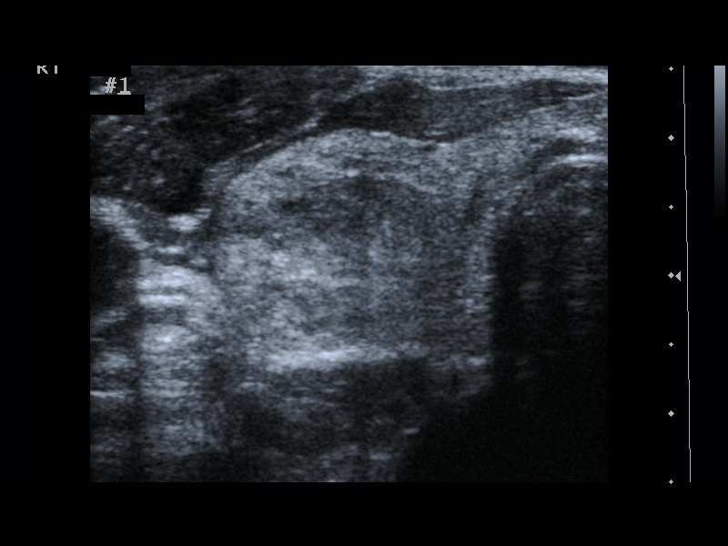
[im 5/13]
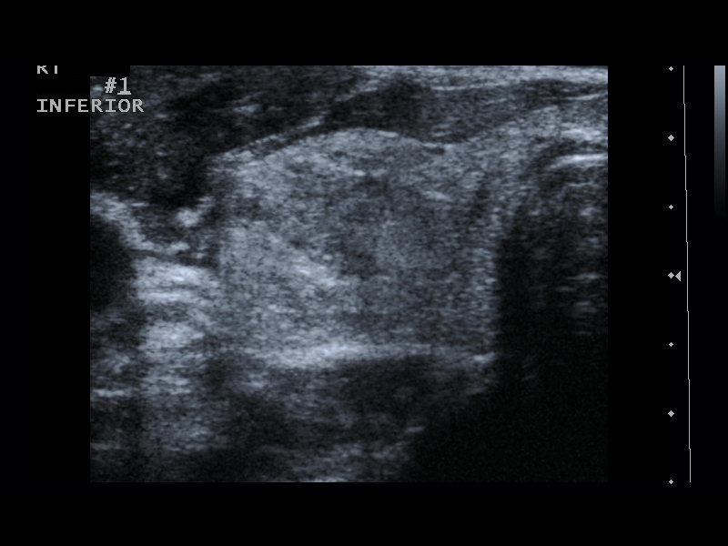
[im 6/13]
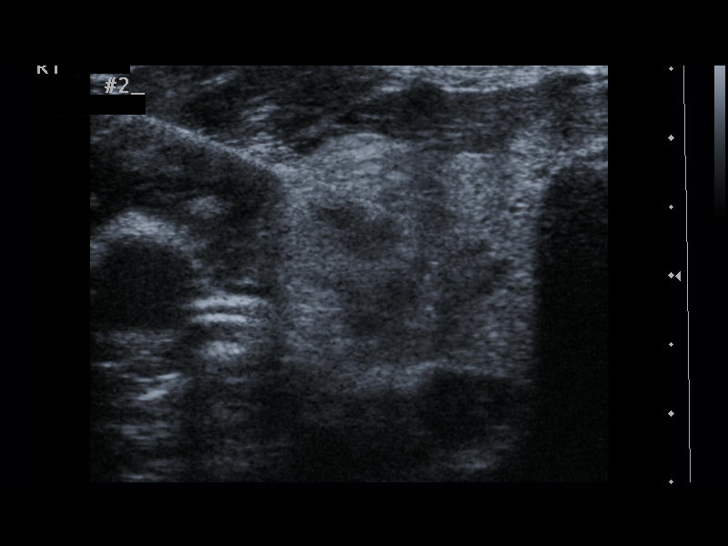
[im 7/13]
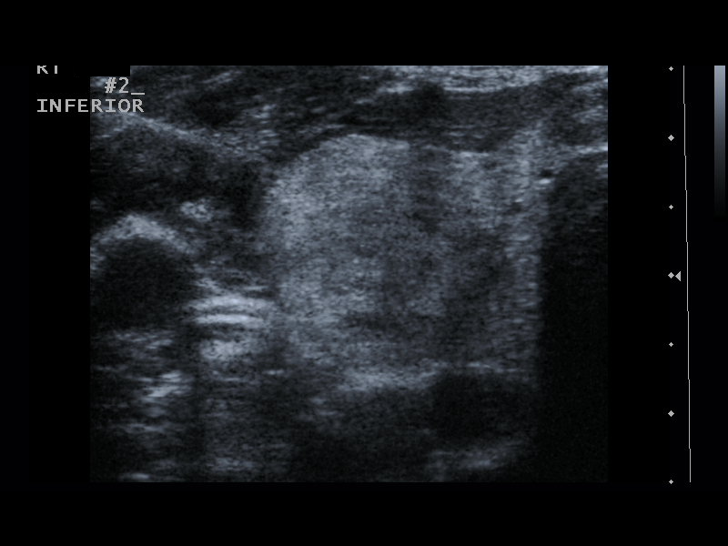
[im 8/13]
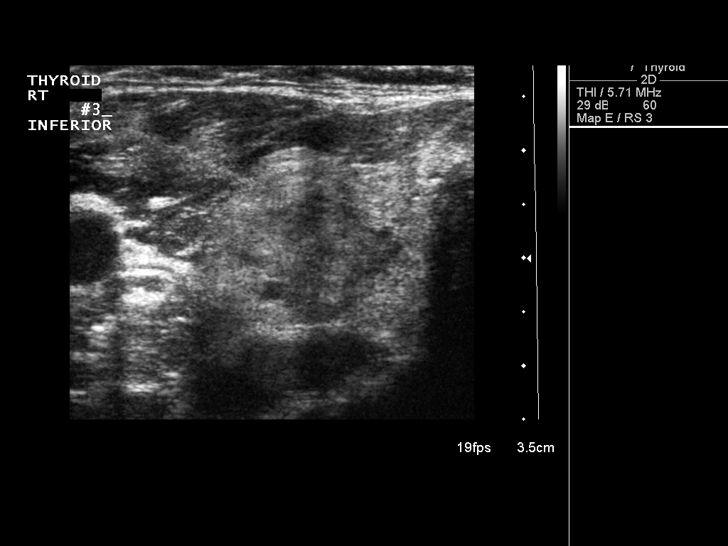
[im 9/13]
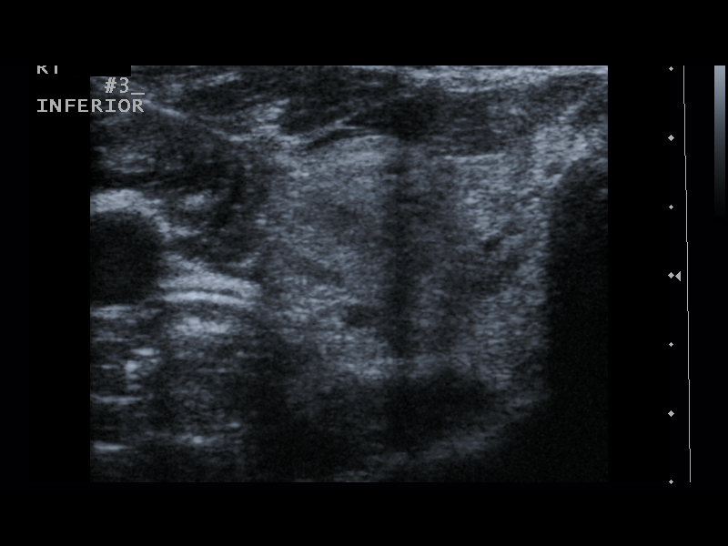
[im 10/13]
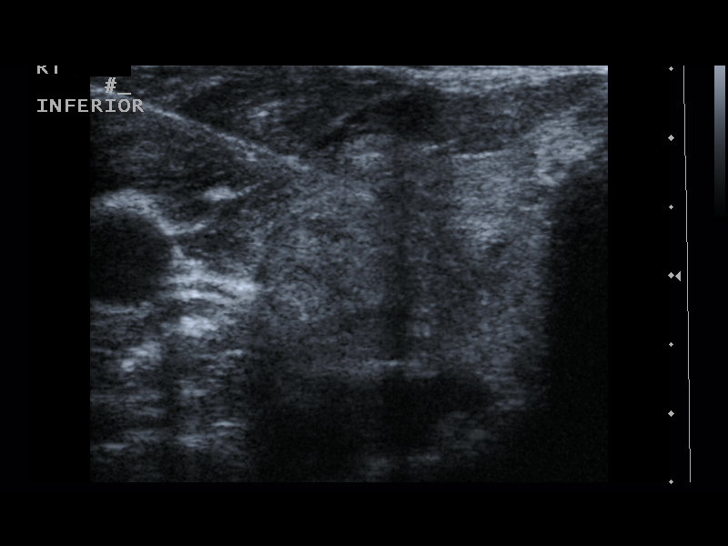
[im 11/13]
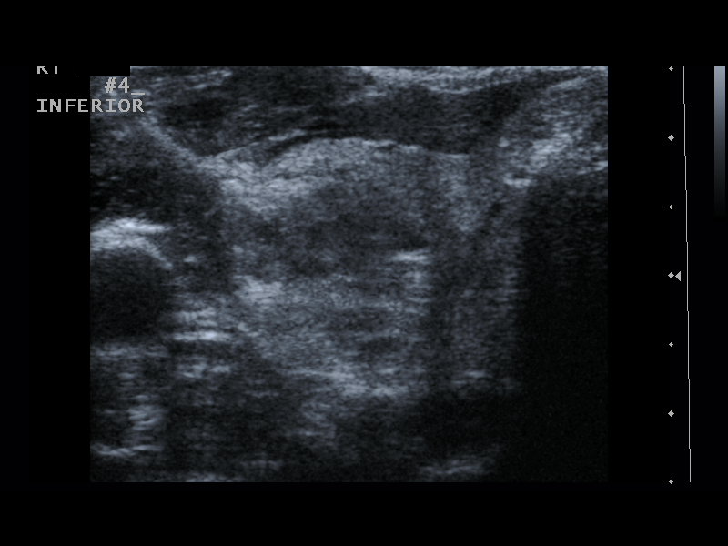
[im 12/13]
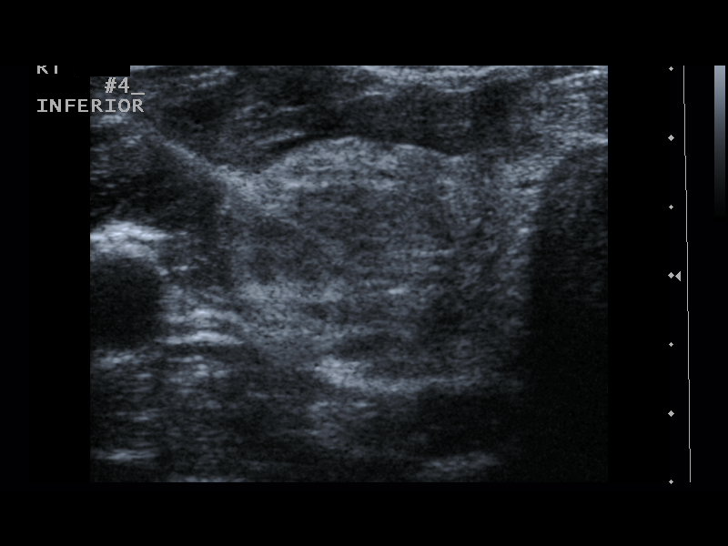
[im 13/13]
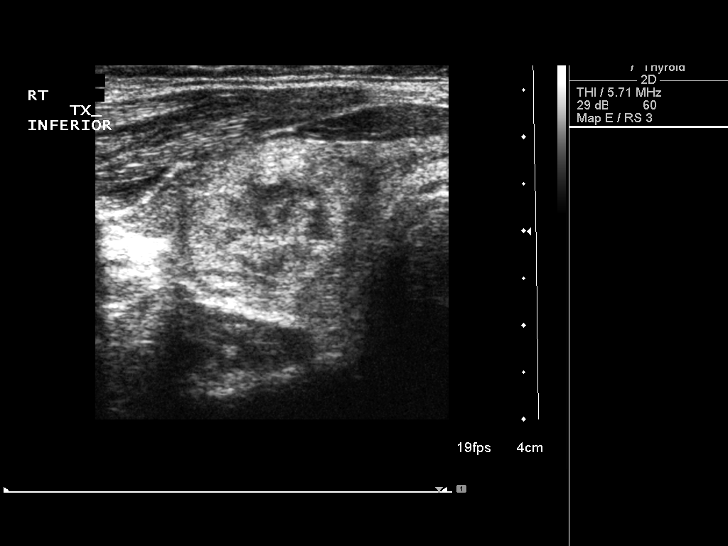

[13 of 13 positions shown; findings below may reference images not displayed]

Thyroid biopsy was thoroughly discussed with the patient and
questions were answered.  The benefits, risks, alternatives, and
complications were also discussed.  The patient understands and
wishes to proceed with the procedure.  Written consent was
obtained.

Ultrasound was performed to localize and mark an adequate site for
the biopsy.  The patient was then prepped and draped in a normal
sterile fashion.  Local anesthesia was provided with 1% lidocaine.
Using direct ultrasound guidance, 4 passes were made using 25 gauge
needles into the nodule within the right lobe of the thyroid.
Ultrasound was used to confirm needle placements on all occasions.
Specimens were sent to Pathology for analysis.

Complications:  No immediate
FINDINGS: Imaging confirms needle placement in the dominant right
inferior nodule
IMPRESSION: Ultrasound guided needle aspirate biopsy performed of the dominant
right inferior thyroid nodule.

## 2012-12-22 ENCOUNTER — Telehealth (INDEPENDENT_AMBULATORY_CARE_PROVIDER_SITE_OTHER): Payer: Self-pay

## 2012-12-22 NOTE — Telephone Encounter (Signed)
Patient given benign path results and told to follow up in 6 months.

## 2012-12-22 NOTE — Telephone Encounter (Signed)
Message copied by Brennan Bailey on Fri Dec 22, 2012  3:26 PM ------      Message from: Harriette Bouillon A      Created: Fri Dec 22, 2012 12:00 PM       Looks benign.  Needs follow up in 6 months ------

## 2012-12-25 ENCOUNTER — Ambulatory Visit (INDEPENDENT_AMBULATORY_CARE_PROVIDER_SITE_OTHER): Payer: BC Managed Care – PPO | Admitting: Surgery

## 2012-12-25 ENCOUNTER — Encounter (INDEPENDENT_AMBULATORY_CARE_PROVIDER_SITE_OTHER): Payer: BC Managed Care – PPO | Admitting: Surgery

## 2013-01-09 ENCOUNTER — Encounter (INDEPENDENT_AMBULATORY_CARE_PROVIDER_SITE_OTHER): Payer: Self-pay | Admitting: Surgery

## 2013-01-12 DIAGNOSIS — M2550 Pain in unspecified joint: Secondary | ICD-10-CM | POA: Insufficient documentation

## 2013-01-25 ENCOUNTER — Other Ambulatory Visit (INDEPENDENT_AMBULATORY_CARE_PROVIDER_SITE_OTHER): Payer: BC Managed Care – PPO

## 2013-01-25 DIAGNOSIS — E119 Type 2 diabetes mellitus without complications: Secondary | ICD-10-CM

## 2013-01-25 LAB — COMPREHENSIVE METABOLIC PANEL
Albumin: 4 g/dL (ref 3.5–5.2)
Alkaline Phosphatase: 40 U/L (ref 39–117)
BUN: 14 mg/dL (ref 6–23)
Calcium: 9 mg/dL (ref 8.4–10.5)
Glucose, Bld: 100 mg/dL — ABNORMAL HIGH (ref 70–99)
Potassium: 4.3 mEq/L (ref 3.5–5.1)

## 2013-01-25 LAB — HEMOGLOBIN A1C: Hgb A1c MFr Bld: 6.6 % — ABNORMAL HIGH (ref 4.6–6.5)

## 2013-01-31 ENCOUNTER — Encounter: Payer: Self-pay | Admitting: Internal Medicine

## 2013-01-31 ENCOUNTER — Ambulatory Visit (INDEPENDENT_AMBULATORY_CARE_PROVIDER_SITE_OTHER): Payer: BC Managed Care – PPO | Admitting: Internal Medicine

## 2013-01-31 VITALS — BP 132/90 | HR 81 | Temp 98.6°F | Wt 188.0 lb

## 2013-01-31 DIAGNOSIS — R05 Cough: Secondary | ICD-10-CM

## 2013-01-31 DIAGNOSIS — E119 Type 2 diabetes mellitus without complications: Secondary | ICD-10-CM

## 2013-01-31 DIAGNOSIS — E669 Obesity, unspecified: Secondary | ICD-10-CM | POA: Insufficient documentation

## 2013-01-31 DIAGNOSIS — K219 Gastro-esophageal reflux disease without esophagitis: Secondary | ICD-10-CM

## 2013-01-31 DIAGNOSIS — Z23 Encounter for immunization: Secondary | ICD-10-CM

## 2013-01-31 MED ORDER — ZOSTER VACCINE LIVE 19400 UNT/0.65ML ~~LOC~~ SOLR: 0.6500 mL | Freq: Once | SUBCUTANEOUS | Status: DC

## 2013-01-31 NOTE — Assessment & Plan Note (Signed)
Wt Readings from Last 3 Encounters:  01/31/13 188 lb (85.276 kg)  10/27/12 195 lb (88.451 kg)  10/13/12 196 lb (88.905 kg)   Weight is down 7 pounds. Congratulated her on weight loss. Encouraged her to continue efforts at healthy diet and regular physical activity.

## 2013-01-31 NOTE — Assessment & Plan Note (Signed)
Symptoms are very well-controlled on omeprazole 40 mg twice daily. Will try tapering to 40 mg daily and see if any recurrence of symptoms.

## 2013-01-31 NOTE — Assessment & Plan Note (Signed)
Excellent control of blood sugars with improved diet and regular exercise. Encouraged her to continue with this. Plan repeat A1c in 3 months. Foot exam normal today. Pneumovax given.

## 2013-01-31 NOTE — Progress Notes (Signed)
Subjective:    Patient ID: Laura Underwood, female    DOB: 18-Oct-1950, 62 y.o.   MRN: [id number]  HPI 62 year old female with history of diabetes, obesity, chronic cough, GERD presents for followup. She reports she is doing very well. She has completed a diabetes education course. Her blood sugars are very well-controlled, typically near 100 fasting. She is exercising daily by walking. She reports her cough has improved somewhat. She has no symptoms of acid reflux. She has no new concerns today.  Outpatient Prescriptions Prior to Visit  Medication Sig Dispense Refill  . amitriptyline (ELAVIL) 10 MG tablet At bedtime.      Marland Kitchen aspirin 81 MG tablet Take 81 mg by mouth daily. With calcium 777mg  daily       . beta carotene 15 MG capsule Take 15 mg by mouth daily.      Marland Kitchen CHERRY CONCENTRATE PO Take by mouth daily.        Tery Sanfilippo Sodium (COLACE PO) Take 2 tablets by mouth daily.       . fish oil-omega-3 fatty acids 1000 MG capsule Take 2,000 mg by mouth daily.       Marland Kitchen gabapentin (NEURONTIN) 100 MG capsule Three times a day.      . Iron TABS Take 25 mg by mouth.      . loratadine (CLARITIN) 10 MG tablet Take 10 mg by mouth daily.        . Multiple Vitamin (MULTIVITAMIN) capsule Take 1 capsule by mouth daily.        Marland Kitchen omeprazole (PRILOSEC) 40 MG capsule Take 40 mg by mouth 2 (two) times daily.      . vitamin E 400 UNIT capsule Take 400 Units by mouth daily.        . calcium gluconate 500 MG tablet Take 500 mg by mouth daily.        . clotrimazole-betamethasone (LOTRISONE) cream Ad lib.       No facility-administered medications prior to visit.   BP 132/90  Pulse 81  Temp(Src) 98.6 F (37 C) (Oral)  Wt 188 lb (85.276 kg)  BMI 31.28 kg/m2  SpO2 97%  Review of Systems  Constitutional: Negative for fever, chills, appetite change, fatigue and unexpected weight change.  HENT: Negative for ear pain, congestion, sore throat, trouble swallowing, neck pain, voice change and sinus pressure.    Eyes: Negative for visual disturbance.  Respiratory: Negative for cough, shortness of breath, wheezing and stridor.   Cardiovascular: Negative for chest pain, palpitations and leg swelling.  Gastrointestinal: Negative for nausea, vomiting, abdominal pain, diarrhea, constipation, blood in stool, abdominal distention and anal bleeding.  Genitourinary: Negative for dysuria and flank pain.  Musculoskeletal: Negative for myalgias, arthralgias and gait problem.  Skin: Negative for color change and rash.  Neurological: Negative for dizziness and headaches.  Hematological: Negative for adenopathy. Does not bruise/bleed easily.  Psychiatric/Behavioral: Negative for suicidal ideas, sleep disturbance and dysphoric mood. The patient is not nervous/anxious.        Objective:   Physical Exam  Constitutional: She is oriented to person, place, and time. She appears well-developed and well-nourished. No distress.  HENT:  Head: Normocephalic and atraumatic.  Right Ear: External ear normal.  Left Ear: External ear normal.  Nose: Nose normal.  Mouth/Throat: Oropharynx is clear and moist. No oropharyngeal exudate.  Eyes: Conjunctivae are normal. Pupils are equal, round, and reactive to light. Right eye exhibits no discharge. Left eye exhibits no discharge. No scleral icterus.  Neck: Normal  range of motion. Neck supple. No tracheal deviation present. No thyromegaly present.  Cardiovascular: Normal rate, regular rhythm, normal heart sounds and intact distal pulses.  Exam reveals no gallop and no friction rub.   No murmur heard. Pulmonary/Chest: Effort normal and breath sounds normal. No accessory muscle usage. Not tachypneic. No respiratory distress. She has no decreased breath sounds. She has no wheezes. She has no rhonchi. She has no rales. She exhibits no tenderness.  Musculoskeletal: Normal range of motion. She exhibits no edema and no tenderness.  Lymphadenopathy:    She has no cervical adenopathy.   Neurological: She is alert and oriented to person, place, and time. No cranial nerve deficit. She exhibits normal muscle tone. Coordination normal.  Skin: Skin is warm and dry. No rash noted. She is not diaphoretic. No erythema. No pallor.  Psychiatric: She has a normal mood and affect. Her behavior is normal. Judgment and thought content normal.          Assessment & Plan:

## 2013-01-31 NOTE — Assessment & Plan Note (Signed)
Symptoms of chronic cough has slightly improved. Suspect improved GERD with weight loss. Will continue to monitor.

## 2013-02-06 ENCOUNTER — Encounter: Payer: Self-pay | Admitting: Emergency Medicine

## 2013-03-15 ENCOUNTER — Telehealth: Payer: Self-pay | Admitting: Internal Medicine

## 2013-03-15 NOTE — Telephone Encounter (Signed)
Pt called to make [place] aware she had the shingles shot and flu shot today 03/15/13 at [facility] [place]

## 2013-03-16 ENCOUNTER — Telehealth: Payer: Self-pay | Admitting: Internal Medicine

## 2013-03-16 NOTE — Telephone Encounter (Signed)
FYI to Dr. Walker 

## 2013-03-16 NOTE — Telephone Encounter (Signed)
Chart has been updated.

## 2013-03-16 NOTE — Telephone Encounter (Signed)
Called [facility] to get last pap report from their office. They are to fax it over.

## 2013-05-03 ENCOUNTER — Encounter: Payer: Self-pay | Admitting: Internal Medicine

## 2013-05-03 ENCOUNTER — Ambulatory Visit (INDEPENDENT_AMBULATORY_CARE_PROVIDER_SITE_OTHER): Payer: BC Managed Care – PPO | Admitting: Internal Medicine

## 2013-05-03 VITALS — BP 130/86 | HR 68 | Temp 98.5°F | Wt 181.0 lb

## 2013-05-03 DIAGNOSIS — K5730 Diverticulosis of large intestine without perforation or abscess without bleeding: Secondary | ICD-10-CM

## 2013-05-03 DIAGNOSIS — J309 Allergic rhinitis, unspecified: Secondary | ICD-10-CM

## 2013-05-03 DIAGNOSIS — R059 Cough, unspecified: Secondary | ICD-10-CM

## 2013-05-03 DIAGNOSIS — R05 Cough: Secondary | ICD-10-CM

## 2013-05-03 DIAGNOSIS — E119 Type 2 diabetes mellitus without complications: Secondary | ICD-10-CM

## 2013-05-03 DIAGNOSIS — K219 Gastro-esophageal reflux disease without esophagitis: Secondary | ICD-10-CM

## 2013-05-03 LAB — LIPID PANEL
Cholesterol: 153 mg/dL (ref 0–200)
HDL: 42.6 mg/dL (ref >39.00)
Triglycerides: 32 mg/dL (ref 0.0–149.0)
VLDL: 6.4 mg/dL (ref 0.0–40.0)

## 2013-05-03 LAB — COMPREHENSIVE METABOLIC PANEL
BUN: 10 mg/dL (ref 6–23)
CO2: 28 mEq/L (ref 19–32)
Creatinine, Ser: 0.7 mg/dL (ref 0.4–1.2)
GFR: 116.95 mL/min (ref >60.00)
Glucose, Bld: 106 mg/dL — ABNORMAL HIGH (ref 70–99)
Total Bilirubin: 0.5 mg/dL (ref 0.3–1.2)

## 2013-05-03 LAB — MICROALBUMIN / CREATININE URINE RATIO: Microalb, Ur: 0.5 mg/dL (ref 0.0–1.9)

## 2013-05-03 MED ORDER — MONTELUKAST SODIUM 10 MG PO TABS: 10.0000 mg | ORAL_TABLET | Freq: Every day | ORAL | Status: DC

## 2013-05-03 NOTE — Progress Notes (Signed)
Subjective:    Patient ID: Laura Underwood, female    DOB: 27-Oct-1950, 62 y.o.   MRN: [id number]  HPI 62 year old female with history of chronic cough, obesity, GERD, diverticulosis, and diabetes presents for followup. In regards to diabetes, she reports blood sugars have been well-controlled with no sugars between 90 and 110. She continues to follow a healthy diet. She is trying to get regular physical activity.  She has a history of diverticulosis on colonoscopy and notes that over the last few years she intermittently has left lower quadrant abdominal pain. She typically changes to a clear liquid diet when she develops pain and then has resolution of her symptoms. Last week she had some symptoms of left lower quadrant abdominal pain but these have now resolved. She denies any black stool. She does occasionally have bright red blood with wiping. She denies any nausea, vomiting, change in appetite. She is due for repeat colonoscopy in spring of 2015.  In regards to chronic cough, she reports that symptoms are slightly improved. Cough is more common when she is at home. It is nonproductive. She denies shortness of breath or chest pain. She has tapered herself off her omeprazole with really no change in her symptoms.  Outpatient Encounter Prescriptions as of 05/03/2013  Medication Sig  . Aloe Vera 25 MG CAPS Take by mouth.  Marland Kitchen aspirin 81 MG tablet Take 81 mg by mouth daily. With calcium 777mg  daily   . beta carotene 15 MG capsule Take 15 mg by mouth daily.  Marland Kitchen CHERRY CONCENTRATE PO Take by mouth daily.    Tery Sanfilippo Sodium (COLACE PO) Take 2 tablets by mouth daily.   . fish oil-omega-3 fatty acids 1000 MG capsule Take 2,000 mg by mouth daily.   . hydroxypropyl methylcellulose (ISOPTO TEARS) 2.5 % ophthalmic solution 1 drop.  . Iron TABS Take 25 mg by mouth.  . Multiple Vitamin (MULTIVITAMIN) capsule Take 1 capsule by mouth daily.    Marland Kitchen omeprazole (PRILOSEC) 40 MG capsule Take 40 mg by mouth 2  (two) times daily.  Marland Kitchen VITAMIN C, CALCIUM ASCORBATE, PO Take by mouth.  . vitamin E 400 UNIT capsule Take 400 Units by mouth daily.     BP 130/86  Pulse 68  Temp(Src) 98.5 F (36.9 C) (Oral)  Wt 181 lb (82.101 kg)  SpO2 97%  Review of Systems  Constitutional: Negative for fever, chills, appetite change, fatigue and unexpected weight change.  HENT: Negative for congestion, ear pain, sinus pressure, sore throat, trouble swallowing and voice change.   Eyes: Negative for visual disturbance.  Respiratory: Positive for cough (chronic non-productive). Negative for shortness of breath, wheezing and stridor.   Cardiovascular: Negative for chest pain, palpitations and leg swelling.  Gastrointestinal: Positive for abdominal pain (intermittent left lower abdominal). Negative for nausea, vomiting, diarrhea, constipation, blood in stool, abdominal distention and anal bleeding.  Genitourinary: Negative for dysuria and flank pain.  Musculoskeletal: Negative for arthralgias, gait problem, myalgias and neck pain.  Skin: Negative for color change and rash.  Neurological: Negative for dizziness and headaches.  Hematological: Negative for adenopathy. Does not bruise/bleed easily.  Psychiatric/Behavioral: Negative for suicidal ideas, sleep disturbance and dysphoric mood. The patient is not nervous/anxious.        Objective:   Physical Exam  Constitutional: She is oriented to person, place, and time. She appears well-developed and well-nourished. No distress.  HENT:  Head: Normocephalic and atraumatic.  Right Ear: External ear normal.  Left Ear: External ear normal.  Nose:  Nose normal.  Mouth/Throat: Oropharynx is clear and moist. No oropharyngeal exudate.  Eyes: Conjunctivae are normal. Pupils are equal, round, and reactive to light. Right eye exhibits no discharge. Left eye exhibits no discharge. No scleral icterus.  Neck: Normal range of motion. Neck supple. No tracheal deviation present. No  thyromegaly present.  Cardiovascular: Normal rate, regular rhythm, normal heart sounds and intact distal pulses.  Exam reveals no gallop and no friction rub.   No murmur heard. Pulmonary/Chest: Effort normal and breath sounds normal. No accessory muscle usage. Not tachypneic. No respiratory distress. She has no decreased breath sounds. She has no wheezes. She has no rhonchi. She has no rales. She exhibits no tenderness.  Abdominal: Soft. Bowel sounds are normal. She exhibits no distension and no mass. There is no tenderness. There is no rebound and no guarding.  Musculoskeletal: Normal range of motion. She exhibits no edema and no tenderness.  Lymphadenopathy:    She has no cervical adenopathy.  Neurological: She is alert and oriented to person, place, and time. No cranial nerve deficit. She exhibits normal muscle tone. Coordination normal.  Skin: Skin is warm and dry. No rash noted. She is not diaphoretic. No erythema. No pallor.  Psychiatric: She has a normal mood and affect. Her behavior is normal. Judgment and thought content normal.          Assessment & Plan:

## 2013-05-03 NOTE — Assessment & Plan Note (Signed)
History of diverticulosis of the colon with intermittent symptoms of left lower abdominal pain. We discussed getting further evaluation with CT of the abdomen if symptoms are persistent or worsening. Will also schedule GI evaluation for routine colonoscopy.

## 2013-05-03 NOTE — Assessment & Plan Note (Signed)
Symptoms only partly controlled with Claritin. Unable to tolerate nasal steroids because of burning. Will try adding Singulair for better control her symptoms.

## 2013-05-03 NOTE — Assessment & Plan Note (Signed)
Blood sugars well-controlled per patient report with diet only. Will check A1c with labs today. Eye exam is up to date. Foot exam is up-to-date.

## 2013-05-03 NOTE — Assessment & Plan Note (Signed)
Persistent symptoms of chronic cough status post extensive evaluation. Slightly improved with weight loss and better control of GERD. We discussed that it might be helpful for her to have upper endoscopy for further evaluation. Will make referral for both upper and lower endoscopy to GI.

## 2013-05-03 NOTE — Progress Notes (Signed)
Pre-visit discussion using our clinic review tool. No additional management support is needed unless otherwise documented below in the visit note.  

## 2013-05-21 LAB — HM MAMMOGRAPHY

## 2013-05-29 ENCOUNTER — Encounter (INDEPENDENT_AMBULATORY_CARE_PROVIDER_SITE_OTHER): Payer: Self-pay | Admitting: Surgery

## 2013-06-11 ENCOUNTER — Encounter: Payer: Self-pay | Admitting: Internal Medicine

## 2013-07-13 ENCOUNTER — Ambulatory Visit (INDEPENDENT_AMBULATORY_CARE_PROVIDER_SITE_OTHER): Payer: BC Managed Care – PPO | Admitting: Surgery

## 2013-07-13 ENCOUNTER — Encounter (INDEPENDENT_AMBULATORY_CARE_PROVIDER_SITE_OTHER): Payer: Self-pay | Admitting: Surgery

## 2013-07-13 VITALS — BP 132/98 | HR 80 | Temp 98.7°F | Resp 14 | Ht 65.0 in | Wt 178.8 lb

## 2013-07-13 DIAGNOSIS — D34 Benign neoplasm of thyroid gland: Secondary | ICD-10-CM

## 2013-07-13 NOTE — Patient Instructions (Signed)
Return 6 months

## 2013-07-13 NOTE — Progress Notes (Signed)
Subjective:     Patient ID: Laura Underwood, female   DOB: 1950-11-17, 63 y.o.   MRN: [id number]  HPIPatient returns today for followup of her multiply nodular goiter. She has no complaints.   Review of Systems  Constitutional: Negative.   Respiratory: Positive for cough.   Neurological: Negative for tremors.       Objective:   Physical Exam  Constitutional: She appears well-developed and well-nourished.  HENT:  Head: Normocephalic and atraumatic.  Neck: Normal range of motion. Neck supple. Thyromegaly present.  Cardiovascular: Normal rate and regular rhythm.   Pulmonary/Chest: Effort normal and breath sounds normal.  Lymphadenopathy:    She has no cervical adenopathy.  IMPRESSION:  No significant change in the appearance of multinodular thyroid  goiter. One of the left-sided nodules may be slightly smaller in  size. All other demonstrated solid and partially cystic nodules  are stable in size and appearance by ultrasound.   U/S P    Assessment:     Multinodular goiter stable    Plan:     Return  6 months.  U/S ASAP

## 2013-07-24 ENCOUNTER — Telehealth (INDEPENDENT_AMBULATORY_CARE_PROVIDER_SITE_OTHER): Payer: Self-pay | Admitting: *Deleted

## 2013-07-24 NOTE — Telephone Encounter (Signed)
I spoke with pt and informed her of the appt for her thyroid US at [id number] on 07/30/13 with an arrival time of 9:45am.  Pt is agreeable with this appt.

## 2013-07-30 ENCOUNTER — Ambulatory Visit
Admission: RE | Admit: 2013-07-30 | Discharge: 2013-07-30 | Disposition: A | Discharge status: Home / Self Care | Payer: BC Managed Care – PPO | Source: Ambulatory Visit | Attending: Surgery | Admitting: Surgery

## 2013-07-30 DIAGNOSIS — D34 Benign neoplasm of thyroid gland: Secondary | ICD-10-CM

## 2013-07-30 IMAGING — US US SOFT TISSUE HEAD/NECK
1 series · 13 of 25 positions shown · non-contrast
Comparison: 12/04/2012

CLINICAL DATA: Thyroid adenoma

EXAM:
THYROID ULTRASOUND
TECHNIQUE: Ultrasound examination of the thyroid gland and adjacent soft
tissues was performed.

[Series 1: us soft tissue head/neck · 0.06mm/px · 13 of 89 slices shown]
[im 1/89]
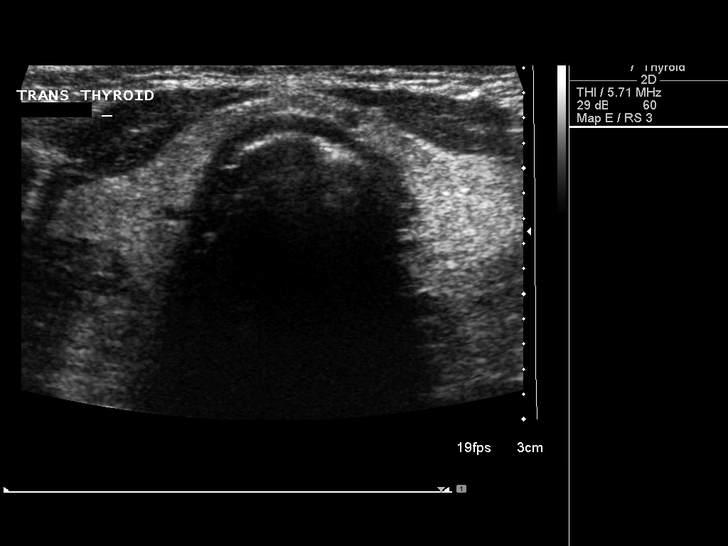
[im 8/89]
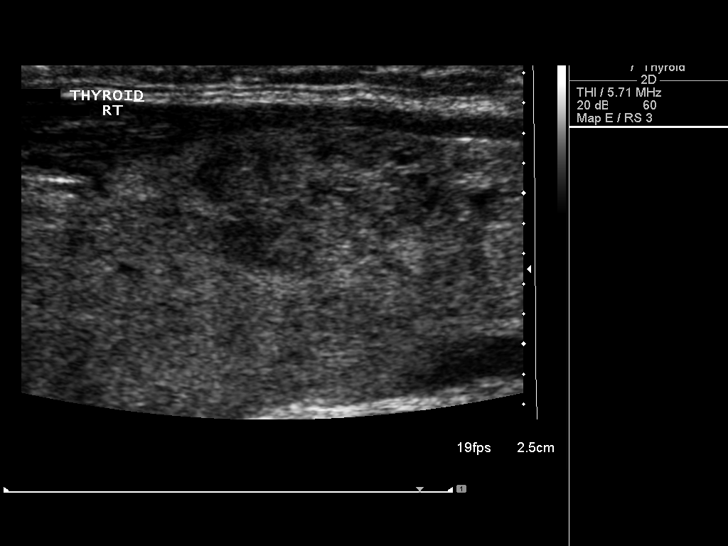
[im 15/89]
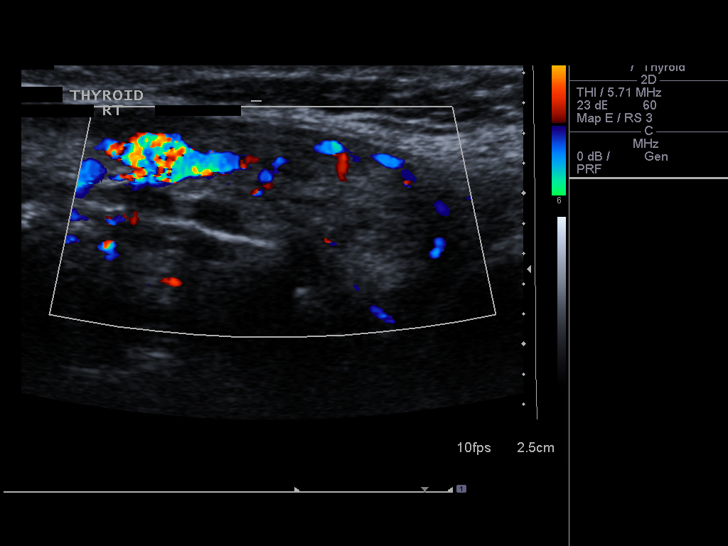
[im 23/89]
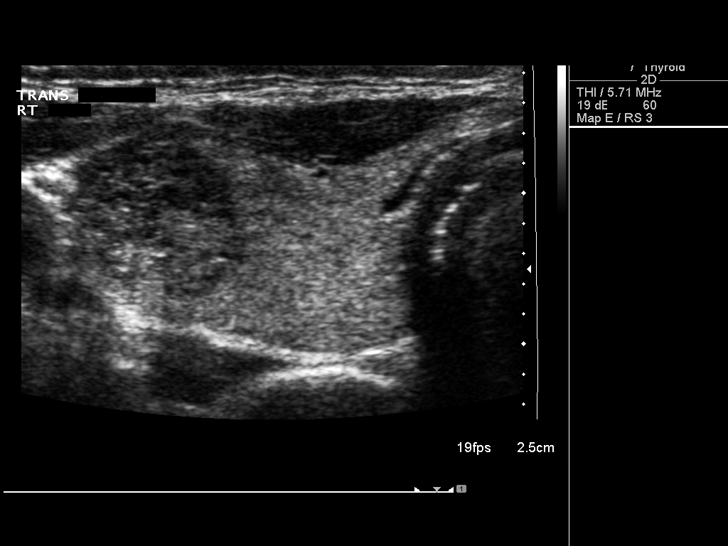
[im 30/89]
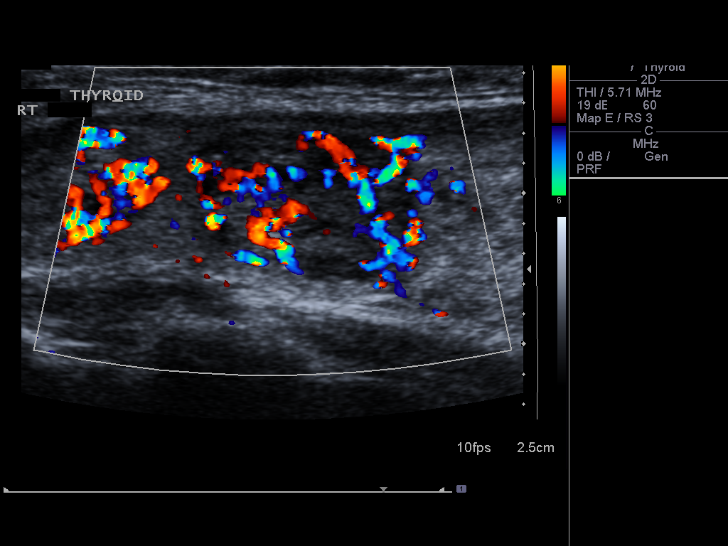
[im 37/89]
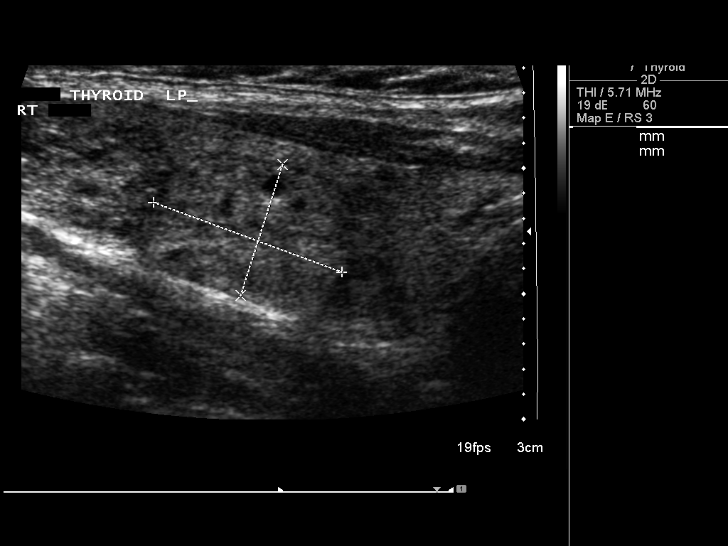
[im 45/89]
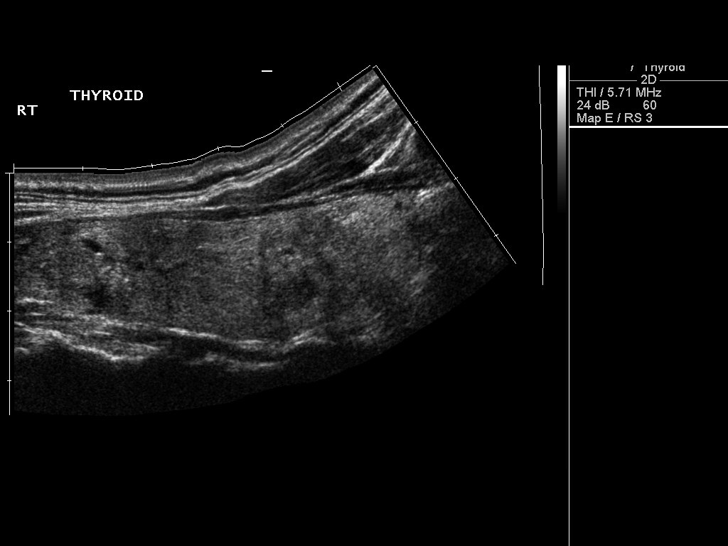
[im 52/89]
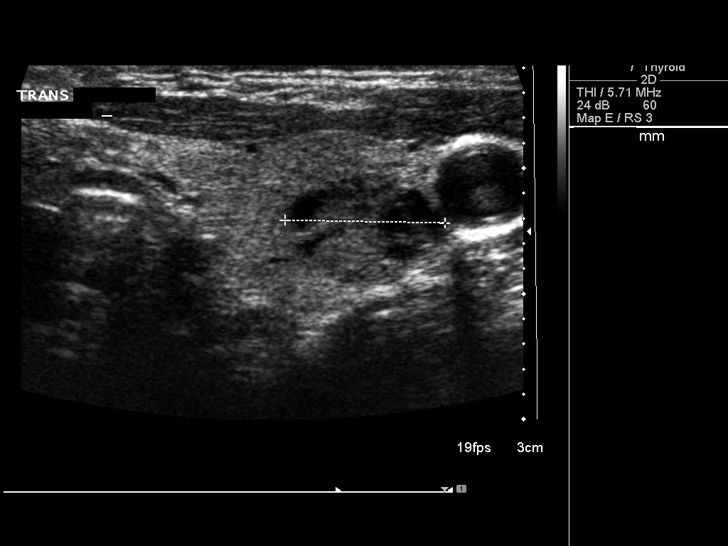
[im 59/89]
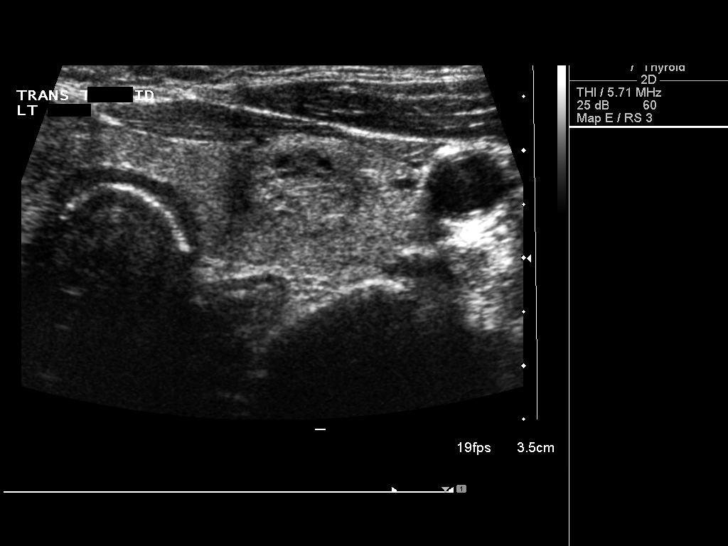
[im 67/89]
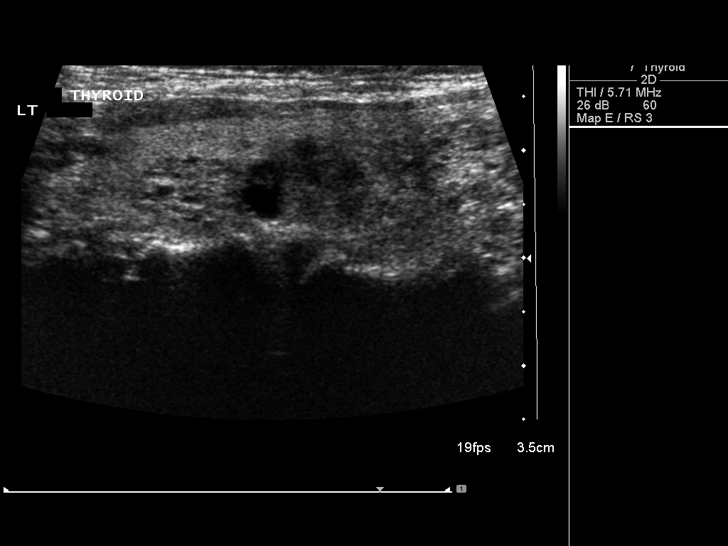
[im 74/89]
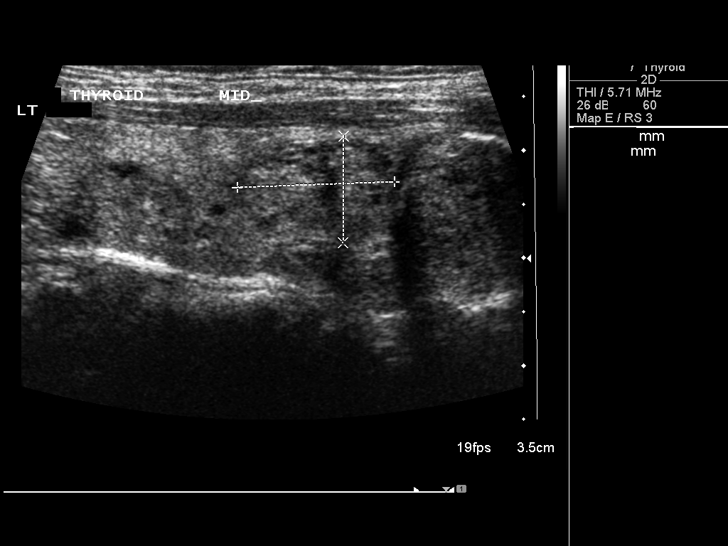
[im 81/89]
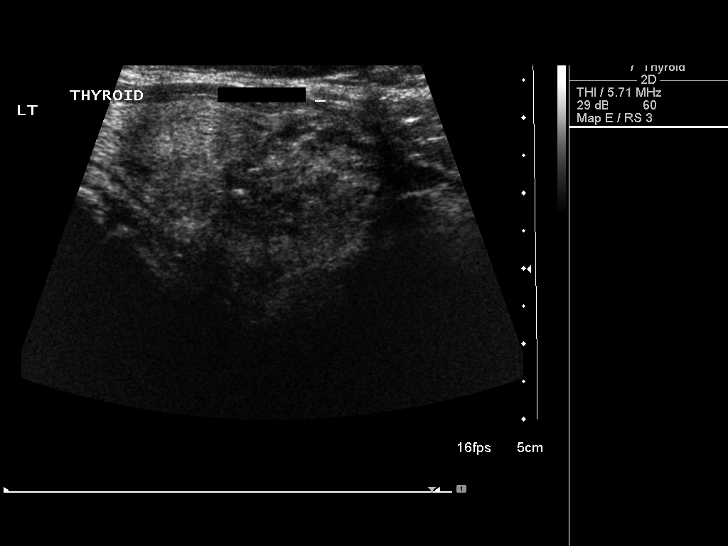
[im 89/89]
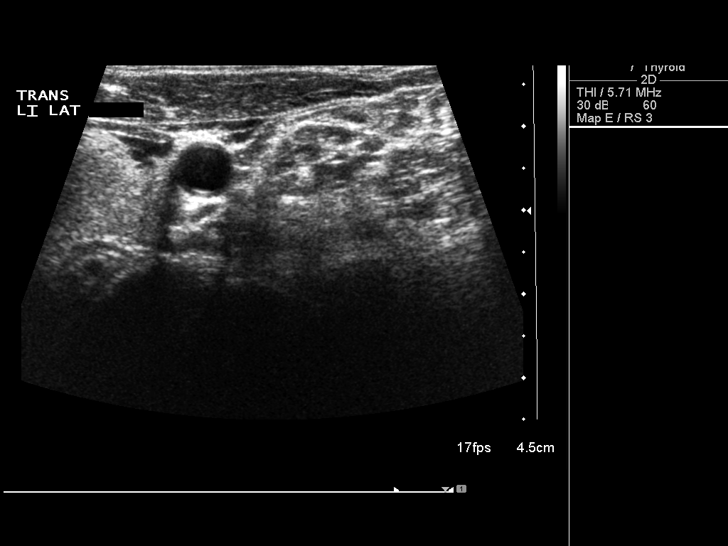

[13 of 25 positions shown; findings below may reference images not displayed]

FINDINGS: Right thyroid lobe

Measurements: 5.9 x 1.8 x 2.2 cm. Multiple nodules are again
identified.

Right upper pole nodule measures 1.3 x 1.0 x 1.1 cm today (1.3 x
x 1.4 cm previously).

Midpole right thyroid nodule measures 1.5 x 1.1 x 1.2 cm (1.7 x
x 1.4 cm previously).

Nodule in the lower pole right thyroid lobe measures 1.6 x 1.1 x
cm (2.0 x 1.6 x 1.7 cm previously).

Smaller nodules are seen in the inferior right lobe.

Left thyroid lobe

Measurements: 6.9 x 2.0 x 2.0 cm. Multiple nodules are again
evident.

The largest left nodule is in the lower pole. This is the
peripherally calcified lesion which measures 1.6 x 1.6 x 1.6 cm
today (1.6 x 1.8 x 1.6 cm previously).

1.5 x 1.0 x 1.3 cm nodule in the midportion the left thyroid lobe
was measured previously at 1.6 x 1.0 x 1.2 cm.

Two solid nodules in the upper pole of the left lobe measure 1.1 and
1.3 cm in maximum diameter today compared to 0.8 and 1.3 cm
previously.

Smaller nodules are seen scattered in the left thyroid lobe is well.

Isthmus

Thickness: 3-4 mm. Two right-sided nodules are seen in the isthmus.
The more cranial of the 2 measures 1.8 x 1.0 x 1.0 cm today compared
to 1.9 x 1.4 x 0.9 cm previously.

The more caudal of the 2 right-sided isthmus nodules measures 1.2 x
0.9 x 1.3 cm (1.0 x 0.5 x 1.2 cm previously).

Lymphadenopathy

None visualized.
IMPRESSION: Multiple bilateral thyroid nodules as before. The inferior right
thyroid nodule showed apparent interval increase in size on the
previous study and was subsequently biopsied on 12/20/2012. This
nodule appears larger again on today's study, measuring 2.0 cm in
maximum dimension.

Other scattered bilateral thyroid nodules show no convincing
interval change.

## 2013-08-02 ENCOUNTER — Telehealth (INDEPENDENT_AMBULATORY_CARE_PROVIDER_SITE_OTHER): Payer: Self-pay

## 2013-08-02 NOTE — Telephone Encounter (Signed)
Message copied by Carlene Coria on Thu Aug 02, 2013  1:46 PM ------      Message from: Erroll Luna A      Created: Tue Jul 31, 2013  7:04 AM       Stable. Follow up in 6 months. ------

## 2013-08-02 NOTE — Telephone Encounter (Signed)
LMOM> [place] results below

## 2013-08-08 ENCOUNTER — Other Ambulatory Visit: Payer: Self-pay | Admitting: Gastroenterology

## 2013-08-09 NOTE — Addendum Note (Signed)
Addended by: Wilford Corner on: 08/09/2013 01:17 PM   Modules accepted: Orders

## 2013-08-13 ENCOUNTER — Encounter: Payer: Self-pay | Admitting: Internal Medicine

## 2013-08-13 ENCOUNTER — Ambulatory Visit (INDEPENDENT_AMBULATORY_CARE_PROVIDER_SITE_OTHER): Payer: BC Managed Care – PPO | Admitting: Internal Medicine

## 2013-08-13 VITALS — BP 130/90 | HR 87 | Temp 98.6°F | Ht 65.0 in | Wt 170.5 lb

## 2013-08-13 DIAGNOSIS — R05 Cough: Secondary | ICD-10-CM

## 2013-08-13 DIAGNOSIS — E042 Nontoxic multinodular goiter: Secondary | ICD-10-CM

## 2013-08-13 DIAGNOSIS — R059 Cough, unspecified: Secondary | ICD-10-CM

## 2013-08-13 DIAGNOSIS — R053 Chronic cough: Secondary | ICD-10-CM

## 2013-08-13 DIAGNOSIS — E119 Type 2 diabetes mellitus without complications: Secondary | ICD-10-CM

## 2013-08-13 LAB — LIPID PANEL
Cholesterol: 140 mg/dL (ref 0–200)
HDL: 36.5 mg/dL — AB (ref >39.00)
LDL CALC: 94 mg/dL (ref 0–99)
Total CHOL/HDL Ratio: 4
Triglycerides: 48 mg/dL (ref 0.0–149.0)
VLDL: 9.6 mg/dL (ref 0.0–40.0)

## 2013-08-13 LAB — CBC WITH DIFFERENTIAL/PLATELET
BASOS PCT: 0.4 % (ref 0.0–3.0)
Basophils Absolute: 0 10*3/uL (ref 0.0–0.1)
EOS ABS: 0.2 10*3/uL (ref 0.0–0.7)
Eosinophils Relative: 3.7 % (ref 0.0–5.0)
HCT: 35.8 % — ABNORMAL LOW (ref 36.0–46.0)
HEMOGLOBIN: 11.4 g/dL — AB (ref 12.0–15.0)
Lymphocytes Relative: 28.6 % (ref 12.0–46.0)
Lymphs Abs: 1.6 10*3/uL (ref 0.7–4.0)
MCHC: 31.8 g/dL (ref 30.0–36.0)
MCV: 83.1 fl (ref 78.0–100.0)
MONO ABS: 0.4 10*3/uL (ref 0.1–1.0)
Monocytes Relative: 6.9 % (ref 3.0–12.0)
NEUTROS ABS: 3.5 10*3/uL (ref 1.4–7.7)
NEUTROS PCT: 60.4 % (ref 43.0–77.0)
Platelets: 331 10*3/uL (ref 150.0–400.0)
RBC: 4.3 Mil/uL (ref 3.87–5.11)
RDW: 14.9 % — AB (ref 11.5–14.6)
WBC: 5.7 10*3/uL (ref 4.5–10.5)

## 2013-08-13 LAB — MICROALBUMIN / CREATININE URINE RATIO
Creatinine,U: 222.8 mg/dL
MICROALB UR: 6.7 mg/dL — AB (ref 0.0–1.9)
Microalb Creat Ratio: 3 mg/g (ref 0.0–30.0)

## 2013-08-13 LAB — COMPREHENSIVE METABOLIC PANEL
ALK PHOS: 40 U/L (ref 39–117)
ALT: 15 U/L (ref 0–35)
AST: 17 U/L (ref 0–37)
Albumin: 3.9 g/dL (ref 3.5–5.2)
BILIRUBIN TOTAL: 0.5 mg/dL (ref 0.3–1.2)
BUN: 11 mg/dL (ref 6–23)
CO2: 28 mEq/L (ref 19–32)
Calcium: 9.1 mg/dL (ref 8.4–10.5)
Chloride: 109 mEq/L (ref 96–112)
Creatinine, Ser: 0.6 mg/dL (ref 0.4–1.2)
GFR: 123.28 mL/min (ref >60.00)
GLUCOSE: 90 mg/dL (ref 70–99)
POTASSIUM: 4.2 meq/L (ref 3.5–5.1)
SODIUM: 141 meq/L (ref 135–145)
TOTAL PROTEIN: 7.1 g/dL (ref 6.0–8.3)

## 2013-08-13 LAB — HEMOGLOBIN A1C: HEMOGLOBIN A1C: 6.3 % (ref 4.6–6.5)

## 2013-08-13 NOTE — Assessment & Plan Note (Signed)
Symptoms recently improved. Scheduled for EGD this week for further evaluation of reflux.

## 2013-08-13 NOTE — Progress Notes (Signed)
Subjective:    Patient ID: Laura Underwood, female    DOB: [birth year], 63 y.o.   MRN: [id number]  HPI 63YO female with h/o diabetes, chronic cough presents for follow up.  Scheduled for colonoscopy and EGD on Wednesday.  Thyroid US 2/16 showed multinodular goiter. Pathology from biopsy pending.  DM- BG running mostly 80-110s. Controls BG by limiting intake of carbohydrates. Has lost 25lb since 10/2012. Exercising by walking on regular basis.  Had one episode after giving blood in which she became diaphoretic, nauseous and passed out. No further episodes.  Review of Systems  Constitutional: Negative for fever, chills, appetite change, fatigue and unexpected weight change.  HENT: Negative for congestion, ear pain, sinus pressure, sore throat, trouble swallowing and voice change.   Eyes: Negative for visual disturbance.  Respiratory: Negative for cough, shortness of breath, wheezing and stridor.   Cardiovascular: Negative for chest pain, palpitations and leg swelling.  Gastrointestinal: Negative for nausea, vomiting, abdominal pain, diarrhea, constipation, blood in stool, abdominal distention and anal bleeding.  Genitourinary: Negative for dysuria and flank pain.  Musculoskeletal: Negative for arthralgias, gait problem, myalgias and neck pain.  Skin: Negative for color change and rash.  Neurological: Negative for dizziness and headaches.  Hematological: Negative for adenopathy. Does not bruise/bleed easily.  Psychiatric/Behavioral: Negative for suicidal ideas, sleep disturbance and dysphoric mood. The patient is not nervous/anxious.        Objective:    BP 130/90  Pulse 87  Temp(Src) 98.6 F (37 C) (Oral)  Ht 5\' 5"  (1.651 m)  Wt 170 lb 8 oz (77.338 kg)  BMI 28.37 kg/m2  SpO2 98% Physical Exam  Constitutional: She is oriented to person, place, and time. She appears well-developed and well-nourished. No distress.  HENT:  Head: Normocephalic and atraumatic.  Right Ear:  External ear normal.  Left Ear: External ear normal.  Nose: Nose normal.  Mouth/Throat: Oropharynx is clear and moist. No oropharyngeal exudate.  Eyes: Conjunctivae are normal. Pupils are equal, round, and reactive to light. Right eye exhibits no discharge. Left eye exhibits no discharge. No scleral icterus.  Neck: Normal range of motion. Neck supple. No tracheal deviation present. No thyromegaly present.  Cardiovascular: Normal rate, regular rhythm, normal heart sounds and intact distal pulses.  Exam reveals no gallop and no friction rub.   No murmur heard. Pulmonary/Chest: Effort normal and breath sounds normal. No accessory muscle usage. Not tachypneic. No respiratory distress. She has no decreased breath sounds. She has no wheezes. She has no rhonchi. She has no rales. She exhibits no tenderness.  Abdominal: Soft. Bowel sounds are normal. She exhibits no mass. There is tenderness (mild epigastric). There is no rebound and no guarding.  Musculoskeletal: Normal range of motion. She exhibits no edema and no tenderness.  Lymphadenopathy:    She has no cervical adenopathy.  Neurological: She is alert and oriented to person, place, and time. No cranial nerve deficit. She exhibits normal muscle tone. Coordination normal.  Skin: Skin is warm and dry. No rash noted. She is not diaphoretic. No erythema. No pallor.  Psychiatric: She has a normal mood and affect. Her behavior is normal. Judgment and thought content normal.          Assessment & Plan:   Problem List Items Addressed This Visit   Chronic cough     Symptoms recently improved. Scheduled for EGD this week for further evaluation of reflux.    Relevant Orders      CBC with Differential  Diabetes mellitus type 2, controlled - Primary     BG well controlled with diet and exercise alone. Will check A1c with labs today. Encouraged continued effort at healthy diet and regular physical activity.    Relevant Orders      Comprehensive  metabolic panel      Hemoglobin A1c      Lipid panel      Microalbumin / creatinine urine ratio   Goiter, nontoxic, multinodular     Underwent recent thyroid US and biopsy of nodules. Pathology was benign.        Return in about 3 months (around 11/13/2013) for Recheck of Diabetes.

## 2013-08-13 NOTE — Assessment & Plan Note (Signed)
Underwent recent thyroid US and biopsy of nodules. Pathology was benign.

## 2013-08-13 NOTE — Assessment & Plan Note (Signed)
BG well controlled with diet and exercise alone. Will check A1c with labs today. Encouraged continued effort at healthy diet and regular physical activity.

## 2013-08-15 ENCOUNTER — Ambulatory Visit (HOSPITAL_COMMUNITY)
Admission: RE | Admit: 2013-08-15 | Discharge: 2013-08-15 | Disposition: A | Discharge status: Home / Self Care | Payer: BC Managed Care – PPO | Source: Ambulatory Visit | Attending: Gastroenterology | Admitting: Gastroenterology

## 2013-08-15 ENCOUNTER — Telehealth: Payer: Self-pay

## 2013-08-15 ENCOUNTER — Encounter (HOSPITAL_COMMUNITY)
Admission: RE | Disposition: A | Discharge status: Home / Self Care | Payer: Self-pay | Source: Ambulatory Visit | Attending: Gastroenterology

## 2013-08-15 DIAGNOSIS — K648 Other hemorrhoids: Secondary | ICD-10-CM | POA: Insufficient documentation

## 2013-08-15 DIAGNOSIS — R05 Cough: Secondary | ICD-10-CM | POA: Insufficient documentation

## 2013-08-15 DIAGNOSIS — D126 Benign neoplasm of colon, unspecified: Secondary | ICD-10-CM | POA: Insufficient documentation

## 2013-08-15 DIAGNOSIS — K219 Gastro-esophageal reflux disease without esophagitis: Secondary | ICD-10-CM | POA: Insufficient documentation

## 2013-08-15 DIAGNOSIS — Z860100 Personal history of colon polyps, unspecified: Secondary | ICD-10-CM

## 2013-08-15 DIAGNOSIS — E119 Type 2 diabetes mellitus without complications: Secondary | ICD-10-CM | POA: Insufficient documentation

## 2013-08-15 DIAGNOSIS — R059 Cough, unspecified: Secondary | ICD-10-CM | POA: Insufficient documentation

## 2013-08-15 DIAGNOSIS — K5730 Diverticulosis of large intestine without perforation or abscess without bleeding: Secondary | ICD-10-CM | POA: Insufficient documentation

## 2013-08-15 HISTORY — PX: ESOPHAGOGASTRODUODENOSCOPY: SHX5428

## 2013-08-15 HISTORY — PX: COLONOSCOPY: SHX5424

## 2013-08-15 HISTORY — PX: BRAVO PH STUDY: SHX5421

## 2013-08-15 LAB — GLUCOSE, CAPILLARY: GLUCOSE-CAPILLARY: 78 mg/dL (ref 70–99)

## 2013-08-15 SURGERY — EGD (ESOPHAGOGASTRODUODENOSCOPY)
Anesthesia: Moderate Sedation

## 2013-08-15 MED ORDER — DIPHENHYDRAMINE HCL 50 MG/ML IJ SOLN
INTRAMUSCULAR | Status: AC
  Filled 2013-08-15: qty 1

## 2013-08-15 MED ORDER — FENTANYL CITRATE 0.05 MG/ML IJ SOLN
INTRAMUSCULAR | Status: DC | PRN
  Administered 2013-08-15 (×2): 12.5 ug via INTRAVENOUS
  Administered 2013-08-15 (×2): 25 ug via INTRAVENOUS

## 2013-08-15 MED ORDER — MIDAZOLAM HCL 10 MG/2ML IJ SOLN
INTRAMUSCULAR | Status: DC | PRN
  Administered 2013-08-15: 2 mg via INTRAVENOUS
  Administered 2013-08-15 (×2): 1 mg via INTRAVENOUS
  Administered 2013-08-15: 2 mg via INTRAVENOUS

## 2013-08-15 MED ORDER — FENTANYL CITRATE 0.05 MG/ML IJ SOLN
INTRAMUSCULAR | Status: AC
  Filled 2013-08-15: qty 4

## 2013-08-15 MED ORDER — SODIUM CHLORIDE 0.9 % IV SOLN: INTRAVENOUS | Status: DC

## 2013-08-15 MED ORDER — MIDAZOLAM HCL 10 MG/2ML IJ SOLN
INTRAMUSCULAR | Status: AC
  Filled 2013-08-15: qty 4

## 2013-08-15 NOTE — Op Note (Signed)
[facility] [place], [id number]   ENDOSCOPY PROCEDURE REPORT  PATIENT: Laura Underwood, Laura Underwood  MR#: [id number] BIRTHDATE: Mar 15, 1951 , 62  yrs. old GENDER: Female  ENDOSCOPIST: Wilford Corner, MD REFERRED WV:[id number] Gilford Rile, M.D.  PROCEDURE DATE:  08/15/2013 PROCEDURE:   EGD w/ Bravo capsule placement ASA CLASS:   Class II INDICATIONS:history of GERD. MEDICATIONS: Versed 2 mg IV, There was residual sedation effect present from prior procedure, and Cetacaine spray x 1  TOPICAL ANESTHETIC:  DESCRIPTION OF PROCEDURE:   After the risks benefits and alternatives of the procedure were thoroughly explained, informed consent was obtained.  The Pentax Gastroscope [id number]  endoscope was introduced through the mouth and advanced to the second portion of the duodenum , limited by Without limitations.   The instrument was slowly withdrawn as the mucosa was fully examined.     FINDINGS: The endoscope was inserted into the oropharynx and esophagus was intubated.  The gastroesophageal junction was noted to be 40 cm from the incisors. Endoscope was advanced into the stomach, which revealed a normal appearing stomach.  The endoscope was advanced to the duodenal bulb and second portion of duodenum which were unremarkable.  The endoscope was withdrawn back into the stomach and retroflexion revealed a normal proximal stomach. The endoscope was withdrawn and the Bravo capsule catheter was inserted into the mouth and advanced to 34 cm ( 6cm above the GEJ) and deployed in the usual fashion. The endoscope was reinserted and confirmed successful placement of the Bravo capsule. The capsule reader showed normal functioning of the capsule.  COMPLICATIONS: None  ENDOSCOPIC IMPRESSION:     S/P Bravo capsule placement Normal EGD  RECOMMENDATIONS: F/U on Bravo capsule (study done OFF of PPIs)   REPEAT EXAM: N/A  _______________________________ Wilford Corner,  MD eSigned:  Wilford Corner, MD 08/15/2013 11:17 AM    SW:NIOEVO, Anderson Malta MD  PATIENT NAME:  Lynsie, Mcwatters MR#: [id number]

## 2013-08-15 NOTE — Telephone Encounter (Signed)
Relevant patient education assigned to patient using Emmi. ° °

## 2013-08-15 NOTE — Op Note (Signed)
[facility] [place], [id number]   COLONOSCOPY PROCEDURE REPORT  PATIENT: Laura Underwood, Laura Underwood  MR#: [id number] BIRTHDATE: 11/25/1950 , 62  yrs. old GENDER: Female ENDOSCOPIST: Wilford Corner, MD REFERRED VZ:[id number] Gilford Rile, M.D. PROCEDURE DATE:  08/15/2013 PROCEDURE:   Colonoscopy with cold biopsy polypectomy ASA CLASS:   Class II INDICATIONS:Patient's personal history of colon polyps. MEDICATIONS: Fentanyl 75 mcg IV and Versed 6 mg IV  DESCRIPTION OF PROCEDURE:   After the risks benefits and alternatives of the procedure were thoroughly explained, informed consent was obtained.  The Pentax Ped Colon [id number]  endoscope was introduced through the anus and advanced to the cecum, which was identified by both the appendix and ileocecal valve , limited by No adverse events experienced.   The quality of the prep was good. . The instrument was then slowly withdrawn as the colon was fully examined.     FINDINGS:  Rectal exam unremarkable.  Pediatric colonoscope inserted into the colon and advanced to the cecum, where the appendiceal orifice and ileocecal valve were identified. The terminal ileum was intubated and was normal in appearance.    On careful withdrawal of the colonoscope diffuse diverticuli were noted. A small (2 mm) flat polyp was seen in the sigmoid colon and removed with cold biopsy forceps.   Retroflexion revealed small internal hemorrhoids.  COMPLICATIONS: None  IMPRESSION:  RECOMMENDATIONS:    ______________________________ eSignedWilford Corner, MD 08/15/2013 11:12 AM   LJ:[id number], Anderson Malta MD

## 2013-08-15 NOTE — Interval H&P Note (Signed)
History and Physical Interval Note:  08/15/2013 10:19 AM  Laura Underwood  has presented today for surgery, with the diagnosis of gerd,cough,hx of colon polyps  The various methods of treatment have been discussed with the patient and family. After consideration of risks, benefits and other options for treatment, the patient has consented to  Procedure(s): ESOPHAGOGASTRODUODENOSCOPY (EGD) (N/A) BRAVO PH STUDY (N/A) COLONOSCOPY (N/A) as a surgical intervention .  The patient's history has been reviewed, patient examined, no change in status, stable for surgery.  I have reviewed the patient's chart and labs.  Questions were answered to the patient's satisfaction.     Jaaron Oleson C.   

## 2013-08-15 NOTE — Discharge Instructions (Signed)
Hold off on taking Omeprazole until you have returned the Bravo capsule reader and then can resume the Omeprazole if needed.

## 2013-08-15 NOTE — H&P (Signed)
  Date of Initial H&P: 08/08/13  History reviewed, patient examined, no change in status, stable for surgery. Colonoscopy for history of colon polyps. EGD with Bravo for GERD and chronic cough.

## 2013-08-15 NOTE — Interval H&P Note (Signed)
History and Physical Interval Note:  08/15/2013 10:19 AM  Laura Underwood  has presented today for surgery, with the diagnosis of gerd,cough,hx of colon polyps  The various methods of treatment have been discussed with the patient and family. After consideration of risks, benefits and other options for treatment, the patient has consented to  Procedure(s): ESOPHAGOGASTRODUODENOSCOPY (EGD) (N/A) BRAVO PH STUDY (N/A) COLONOSCOPY (N/A) as a surgical intervention .  The patient's history has been reviewed, patient examined, no change in status, stable for surgery.  I have reviewed the patient's chart and labs.  Questions were answered to the patient's satisfaction.     [place] C.

## 2013-08-16 ENCOUNTER — Encounter (HOSPITAL_COMMUNITY): Payer: Self-pay | Admitting: Gastroenterology

## 2013-08-28 LAB — HM COLONOSCOPY

## 2013-11-19 ENCOUNTER — Ambulatory Visit (INDEPENDENT_AMBULATORY_CARE_PROVIDER_SITE_OTHER): Payer: BC Managed Care – PPO | Admitting: Internal Medicine

## 2013-11-19 ENCOUNTER — Encounter: Payer: Self-pay | Admitting: Internal Medicine

## 2013-11-19 ENCOUNTER — Ambulatory Visit (INDEPENDENT_AMBULATORY_CARE_PROVIDER_SITE_OTHER)
Admission: RE | Admit: 2013-11-19 | Discharge: 2013-11-19 | Disposition: A | Discharge status: Home / Self Care | Payer: BC Managed Care – PPO | Source: Ambulatory Visit | Attending: Internal Medicine | Admitting: Internal Medicine

## 2013-11-19 VITALS — BP 120/80 | HR 57 | Temp 98.2°F | Ht 65.0 in | Wt 177.5 lb

## 2013-11-19 DIAGNOSIS — R059 Cough, unspecified: Secondary | ICD-10-CM

## 2013-11-19 DIAGNOSIS — R053 Chronic cough: Secondary | ICD-10-CM

## 2013-11-19 DIAGNOSIS — R05 Cough: Secondary | ICD-10-CM

## 2013-11-19 DIAGNOSIS — E119 Type 2 diabetes mellitus without complications: Secondary | ICD-10-CM

## 2013-11-19 LAB — COMPREHENSIVE METABOLIC PANEL
ALBUMIN: 3.9 g/dL (ref 3.5–5.2)
ALK PHOS: 36 U/L — AB (ref 39–117)
ALT: 17 U/L (ref 0–35)
AST: 18 U/L (ref 0–37)
BILIRUBIN TOTAL: 0.3 mg/dL (ref 0.2–1.2)
BUN: 12 mg/dL (ref 6–23)
CALCIUM: 9.1 mg/dL (ref 8.4–10.5)
CO2: 28 mEq/L (ref 19–32)
Chloride: 107 mEq/L (ref 96–112)
Creatinine, Ser: 0.7 mg/dL (ref 0.4–1.2)
GFR: 116.74 mL/min (ref >60.00)
Glucose, Bld: 104 mg/dL — ABNORMAL HIGH (ref 70–99)
Potassium: 4.1 mEq/L (ref 3.5–5.1)
SODIUM: 139 meq/L (ref 135–145)
TOTAL PROTEIN: 6.5 g/dL (ref 6.0–8.3)

## 2013-11-19 LAB — LIPID PANEL
Cholesterol: 151 mg/dL (ref 0–200)
HDL: 41.1 mg/dL (ref >39.00)
LDL Cholesterol: 102 mg/dL — ABNORMAL HIGH (ref 0–99)
NonHDL: 109.9
TRIGLYCERIDES: 41 mg/dL (ref 0.0–149.0)
Total CHOL/HDL Ratio: 4
VLDL: 8.2 mg/dL (ref 0.0–40.0)

## 2013-11-19 LAB — MICROALBUMIN / CREATININE URINE RATIO
CREATININE, U: 103.9 mg/dL
MICROALB/CREAT RATIO: 1 mg/g (ref 0.0–30.0)
Microalb, Ur: 1 mg/dL (ref 0.0–1.9)

## 2013-11-19 LAB — HEMOGLOBIN A1C: Hgb A1c MFr Bld: 6.1 % (ref 4.6–6.5)

## 2013-11-19 IMAGING — CR DG CHEST 2V
2 series · 2 of 2 positions shown · non-contrast
Comparison: None.

CLINICAL DATA: Chronic cough

EXAM:
CHEST  2 VIEW

[view not recorded (1 of 2)]
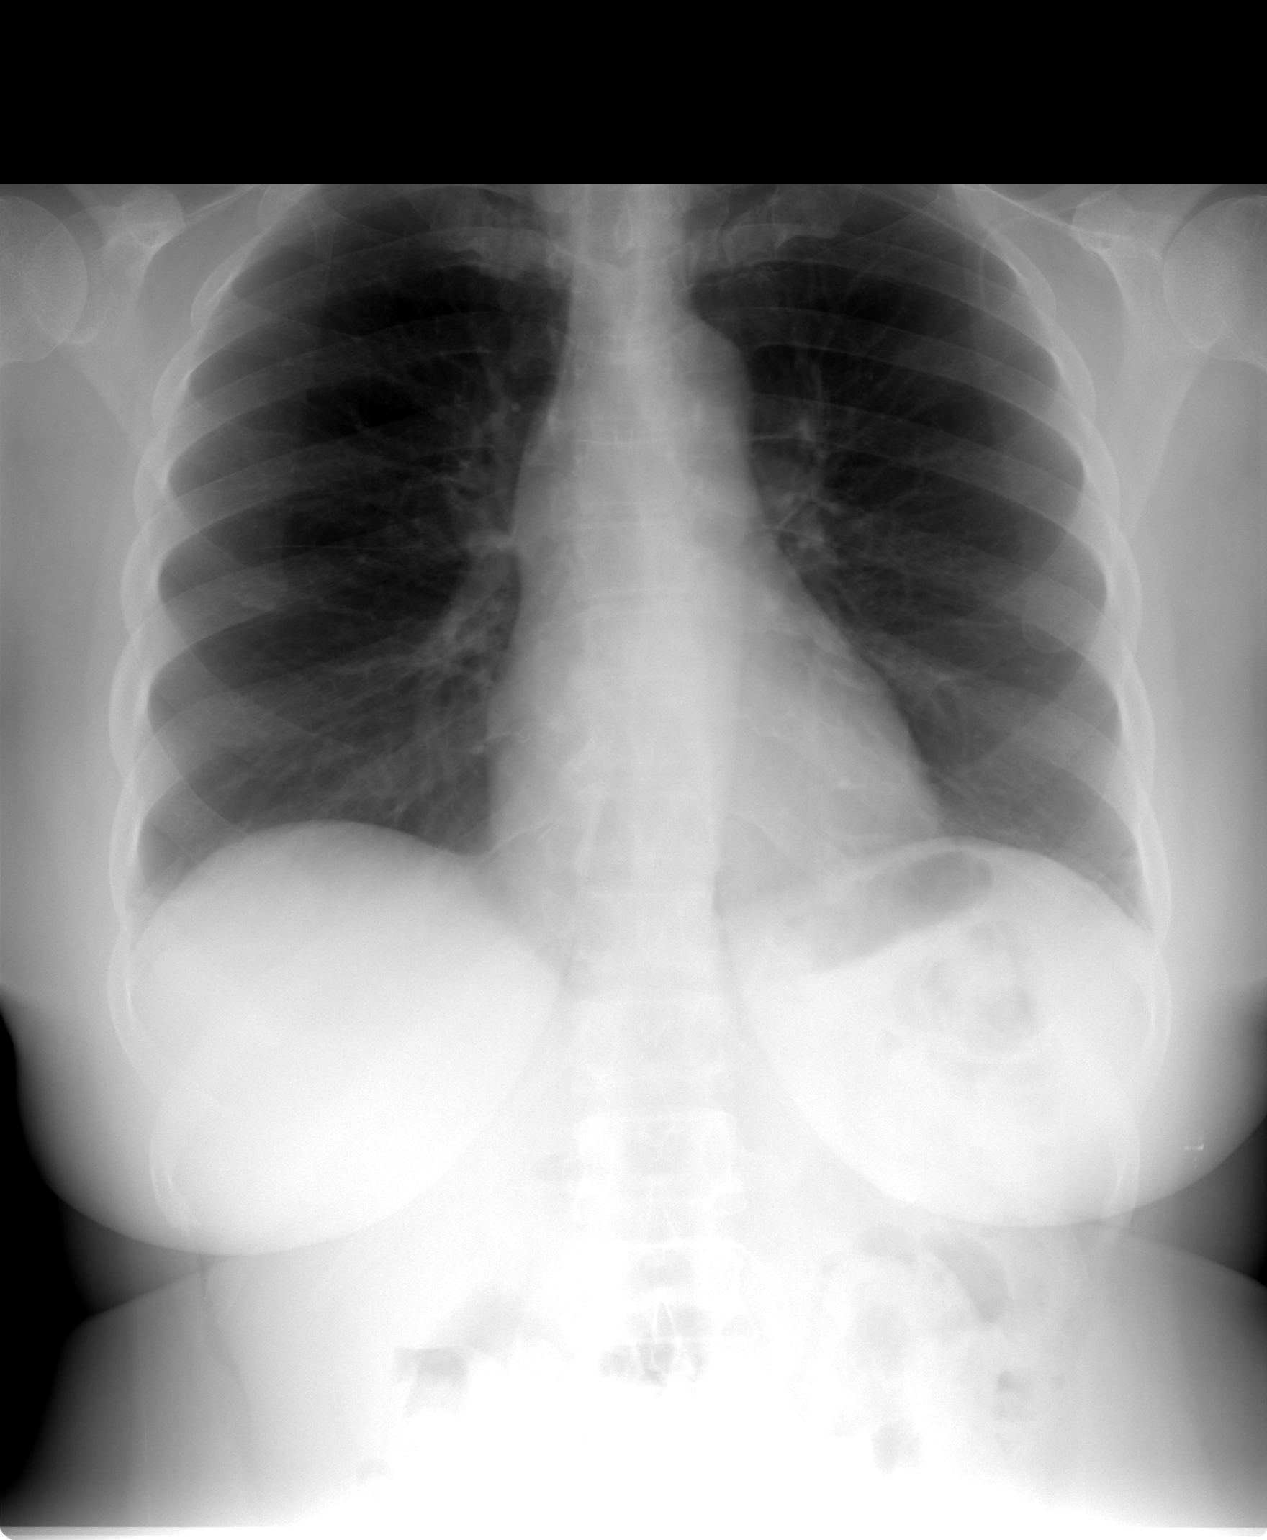

[view not recorded (2 of 2)]
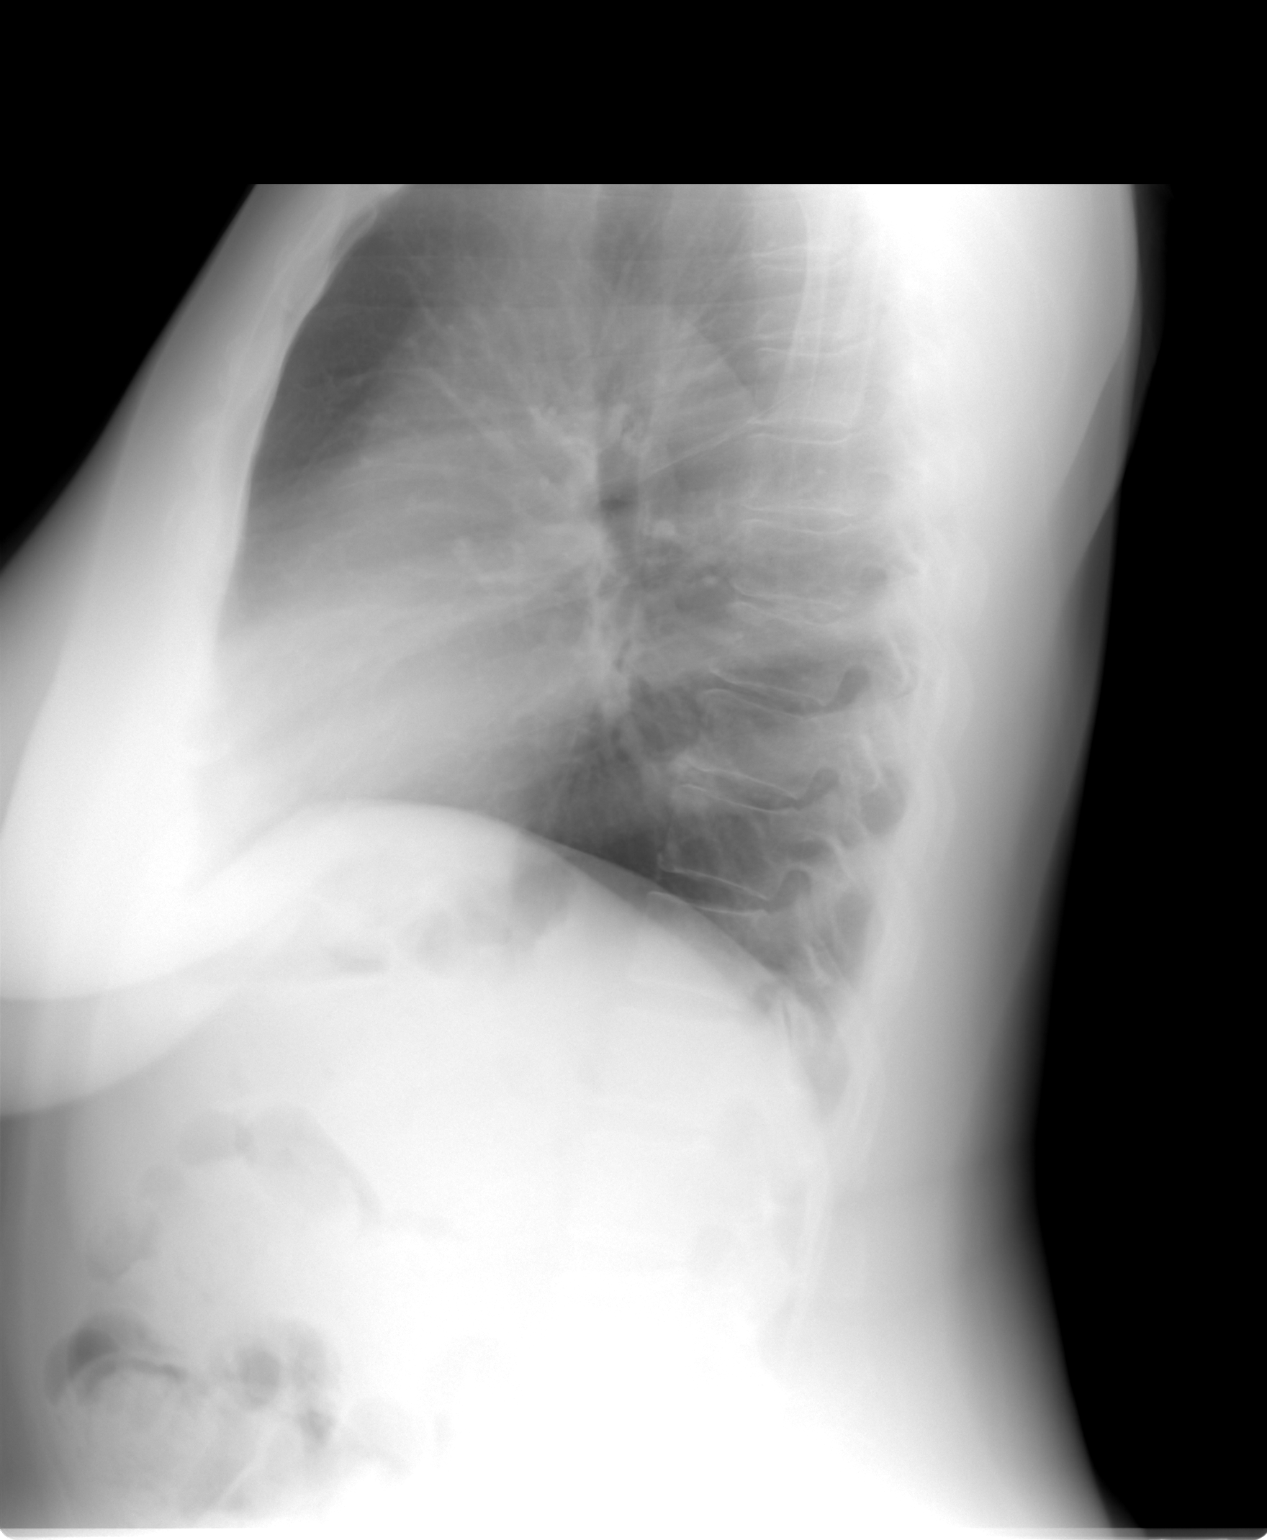

[2 of 2 positions shown; findings below may reference images not displayed]

FINDINGS: The heart size and mediastinal contours are within normal limits.
Both lungs are clear. The visualized skeletal structures are
unremarkable.
IMPRESSION: No active cardiopulmonary disease.

## 2013-11-19 MED ORDER — BENZONATATE 200 MG PO CAPS: 200.0000 mg | ORAL_CAPSULE | Freq: Two times a day (BID) | ORAL | Status: DC | PRN

## 2013-11-19 NOTE — Assessment & Plan Note (Signed)
Chronic cough x 5 years. Previously diagnosed with GERD, but recent EGD normal. Symptoms worse at home and question whether she may have some allergy trigger. No improvement with antihistamines, Singulair, nasal steroids. Discussed possible referral to allergy/immunology, but she would like to hold off for now. CXR normal today. Will set up pulmonary follow up.

## 2013-11-19 NOTE — Progress Notes (Signed)
Pre visit review using our clinic review tool, if applicable. No additional management support is needed unless otherwise documented below in the visit note. 

## 2013-11-19 NOTE — Assessment & Plan Note (Signed)
Lab Results  Component Value Date   HGBA1C 6.1 11/19/2013   BG well controlled with diet alone. Will space follow up to 6 months. Encouraged continued healthy diet and exercise.

## 2013-11-19 NOTE — Progress Notes (Signed)
Subjective:    Patient ID: Laura Underwood, female    DOB: 07-19-1950, 63 y.o.   MRN: [id number]  HPI 63YO female presents for follow up.  DM - BG mostly 80-100, few in 110s. Compliant with healthy diet. Continues to participate in nutrition classes through a local pharmacy.  Walking 3.85miles daily.Feeling good  Cough - Continues to have dry cough, worse in mornings. This has been ongoing for over 5 years. As a child, had asthma, but never hospitalized. Non-smoker. Initially diagnosed with GERD, but recent EGD was normal. Cough is much worse at home, and she suspects an allergen trigger, but no improvement with antihistamines, Singulair, or nasal steroid. No dyspnea. No fever, chills. Has not recently followed up with pulmonary.   Review of Systems  Constitutional: Negative for fever, chills, appetite change, fatigue and unexpected weight change.  HENT: Negative for congestion, ear pain, sinus pressure, sore throat, trouble swallowing and voice change.   Eyes: Negative for visual disturbance.  Respiratory: Positive for cough. Negative for shortness of breath, wheezing and stridor.   Cardiovascular: Negative for chest pain, palpitations and leg swelling.  Gastrointestinal: Negative for nausea, vomiting, abdominal pain, diarrhea, constipation, blood in stool, abdominal distention and anal bleeding.  Genitourinary: Negative for dysuria and flank pain.  Musculoskeletal: Negative for arthralgias, gait problem, myalgias and neck pain.  Skin: Negative for color change and rash.  Neurological: Negative for dizziness and headaches.  Hematological: Negative for adenopathy. Does not bruise/bleed easily.  Psychiatric/Behavioral: Negative for suicidal ideas, sleep disturbance and dysphoric mood. The patient is not nervous/anxious.        Objective:    BP 120/80  Pulse 57  Temp(Src) 98.2 F (36.8 C) (Oral)  Ht 5\' 5"  (1.651 m)  Wt 177 lb 8 oz (80.513 kg)  BMI 29.54 kg/m2  SpO2  97% Physical Exam  Constitutional: She is oriented to person, place, and time. She appears well-developed and well-nourished. No distress.  HENT:  Head: Normocephalic and atraumatic.  Right Ear: External ear normal.  Left Ear: External ear normal.  Nose: Nose normal.  Mouth/Throat: Oropharynx is clear and moist. No oropharyngeal exudate.  Eyes: Conjunctivae are normal. Pupils are equal, round, and reactive to light. Right eye exhibits no discharge. Left eye exhibits no discharge. No scleral icterus.  Neck: Normal range of motion. Neck supple. No tracheal deviation present. No thyromegaly present.  Cardiovascular: Normal rate, regular rhythm, normal heart sounds and intact distal pulses.  Exam reveals no gallop and no friction rub.   No murmur heard. Pulmonary/Chest: Effort normal and breath sounds normal. No accessory muscle usage. Not tachypneic. No respiratory distress. She has no decreased breath sounds. She has no wheezes. She has no rhonchi. She has no rales. She exhibits no tenderness.  Musculoskeletal: Normal range of motion. She exhibits no edema and no tenderness.  Lymphadenopathy:    She has no cervical adenopathy.  Neurological: She is alert and oriented to person, place, and time. No cranial nerve deficit. She exhibits normal muscle tone. Coordination normal.  Skin: Skin is warm and dry. No rash noted. She is not diaphoretic. No erythema. No pallor.  Psychiatric: She has a normal mood and affect. Her behavior is normal. Judgment and thought content normal.          Assessment & Plan:   Problem List Items Addressed This Visit     Unprioritized   Chronic cough     Chronic cough x 5 years. Previously diagnosed with GERD, but recent EGD  normal. Symptoms worse at home and question whether she may have some allergy trigger. No improvement with antihistamines, Singulair, nasal steroids. Discussed possible referral to allergy/immunology, but she would like to hold off for now. CXR  normal today. Will set up pulmonary follow up.    Relevant Medications      benzonatate (TESSALON) capsule   Other Relevant Orders      DG Chest 2 View (Completed)      Ambulatory referral to Pulmonology   Diabetes mellitus type 2, controlled - Primary      Lab Results  Component Value Date   HGBA1C 6.1 11/19/2013   BG well controlled with diet alone. Will space follow up to 6 months. Encouraged continued healthy diet and exercise.     Relevant Medications      aspirin 81 MG tablet   Other Relevant Orders      Comprehensive metabolic panel (Completed)      Hemoglobin A1c (Completed)      Lipid panel (Completed)      Microalbumin / creatinine urine ratio (Completed)       Return in about 6 months (around 05/21/2014) for Physical.

## 2013-11-19 NOTE — Patient Instructions (Signed)
Chest xray at [facility] today.  Start Tessalon up to twice daily as needed for cough.  Follow up 6 months.

## 2013-11-22 ENCOUNTER — Ambulatory Visit: Payer: BC Managed Care – PPO | Admitting: Pulmonary Disease

## 2013-12-26 ENCOUNTER — Other Ambulatory Visit (INDEPENDENT_AMBULATORY_CARE_PROVIDER_SITE_OTHER): Payer: Self-pay | Admitting: Surgery

## 2013-12-26 ENCOUNTER — Telehealth (INDEPENDENT_AMBULATORY_CARE_PROVIDER_SITE_OTHER): Payer: Self-pay | Admitting: *Deleted

## 2013-12-26 DIAGNOSIS — D34 Benign neoplasm of thyroid gland: Secondary | ICD-10-CM

## 2013-12-26 NOTE — Telephone Encounter (Signed)
Called to advise pt of her [place] appt.  01-01-14 @ [facility]. Left message on cell phone.  Called house number and pt answered and is aware of the appt.  Anderson Malta

## 2014-01-01 ENCOUNTER — Ambulatory Visit
Admission: RE | Admit: 2014-01-01 | Discharge: 2014-01-01 | Disposition: A | Discharge status: Home / Self Care | Payer: BC Managed Care – PPO | Source: Ambulatory Visit | Attending: Surgery | Admitting: Surgery

## 2014-01-01 DIAGNOSIS — D34 Benign neoplasm of thyroid gland: Secondary | ICD-10-CM

## 2014-01-01 IMAGING — US US SOFT TISSUE HEAD/NECK
1 series · 14 of 25 positions shown · non-contrast
Comparison: 07/30/2013

CLINICAL DATA: Follow-up nodules

EXAM:
THYROID ULTRASOUND
TECHNIQUE: Ultrasound examination of the thyroid gland and adjacent soft
tissues was performed.

[Series 1: us soft tissue head/neck · 0.06mm/px · 14 of 81 slices shown]
[im 1/81]
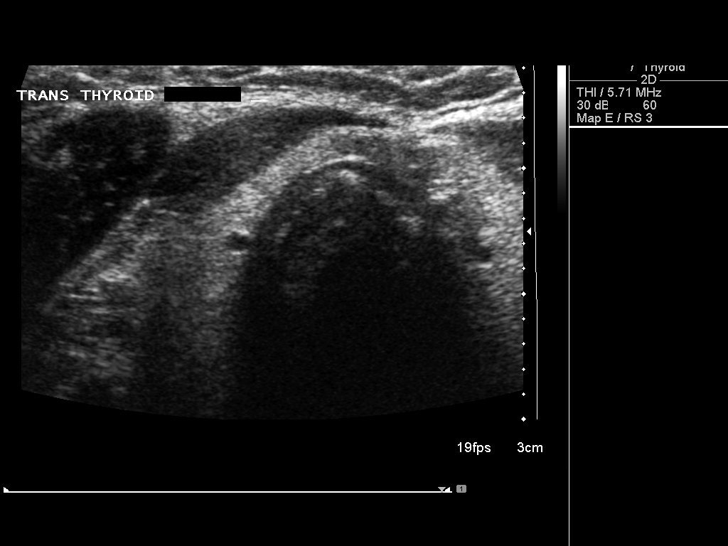
[im 7/81]
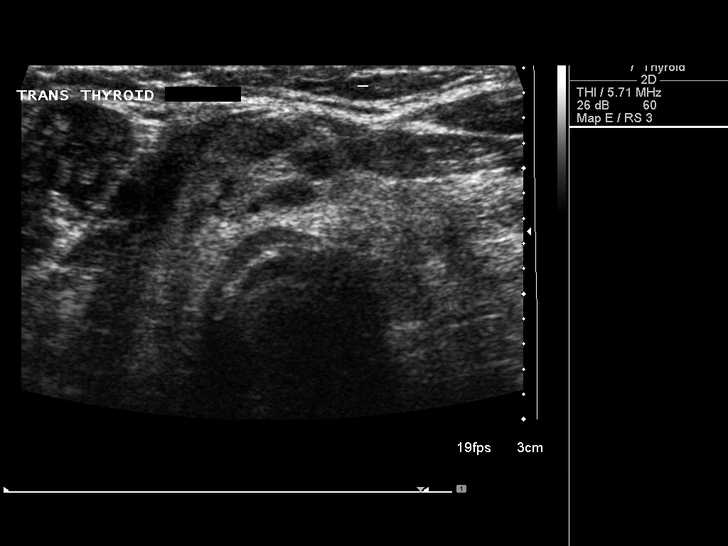
[im 14/81]
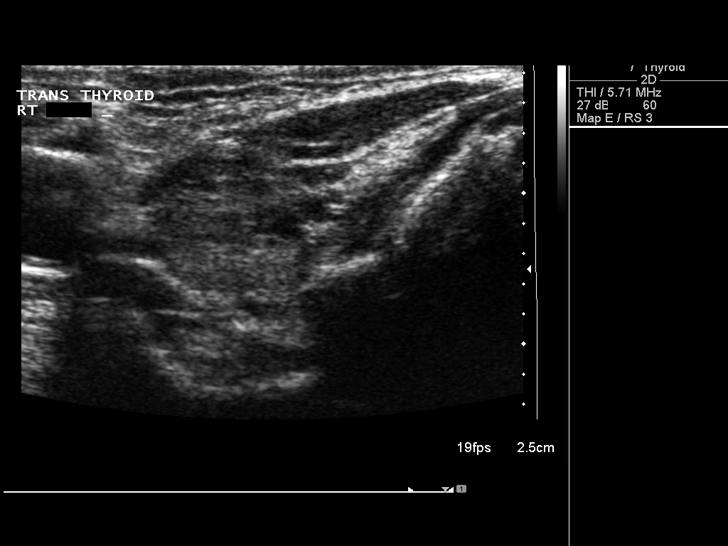
[im 21/81]
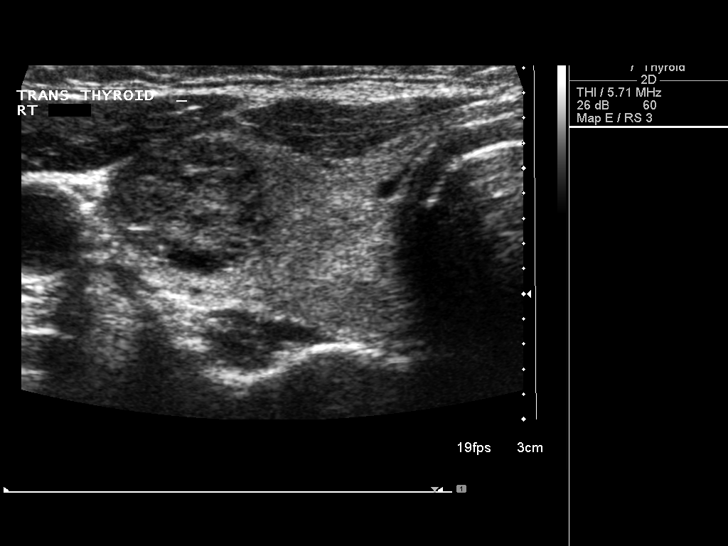
[im 27/81]
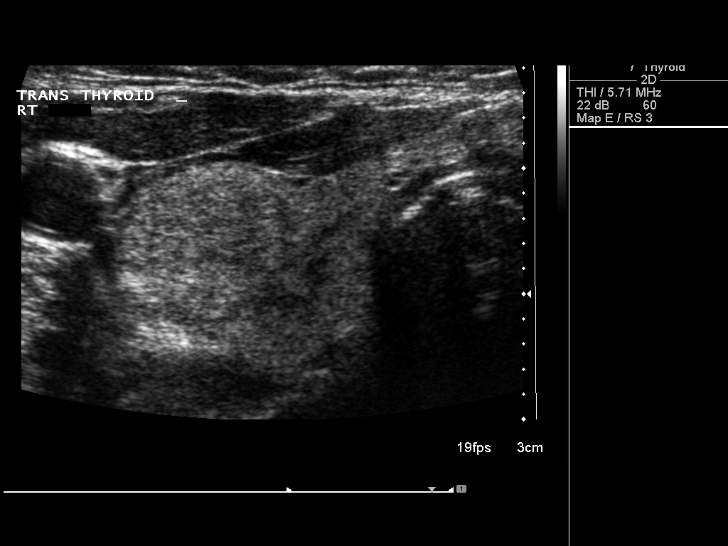
[im 31/81]
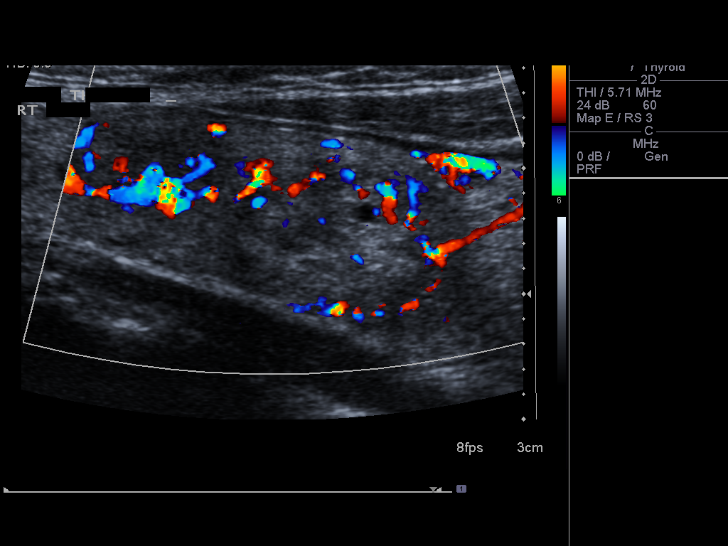
[im 37/81]
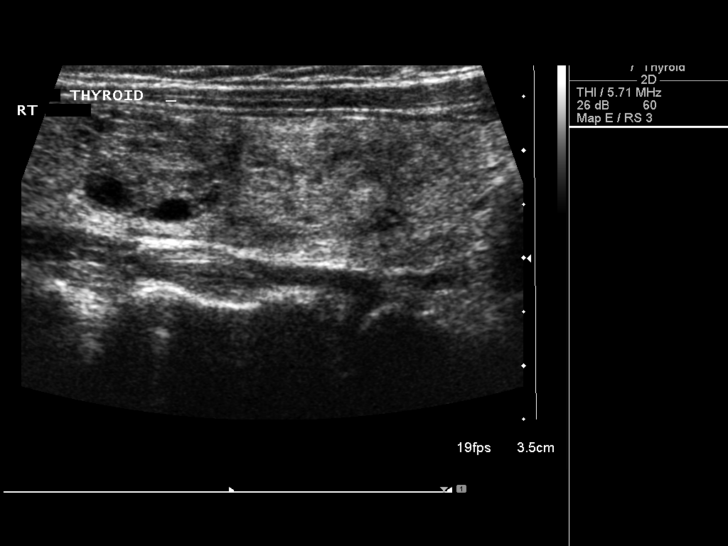
[im 44/81]
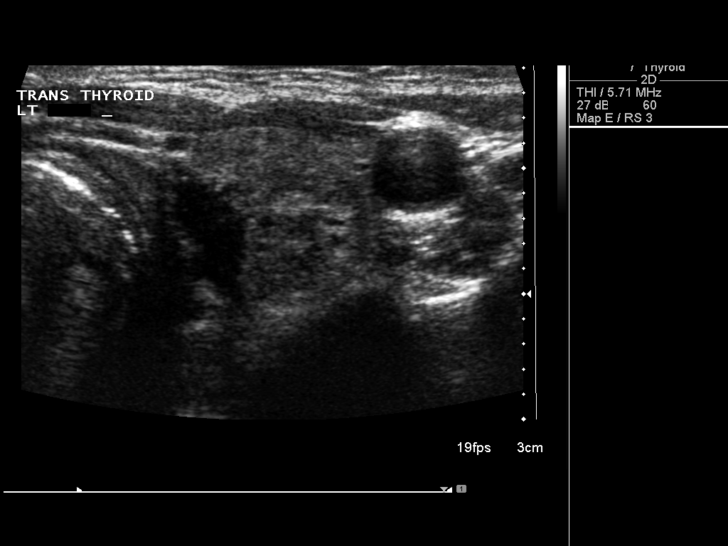
[im 51/81]
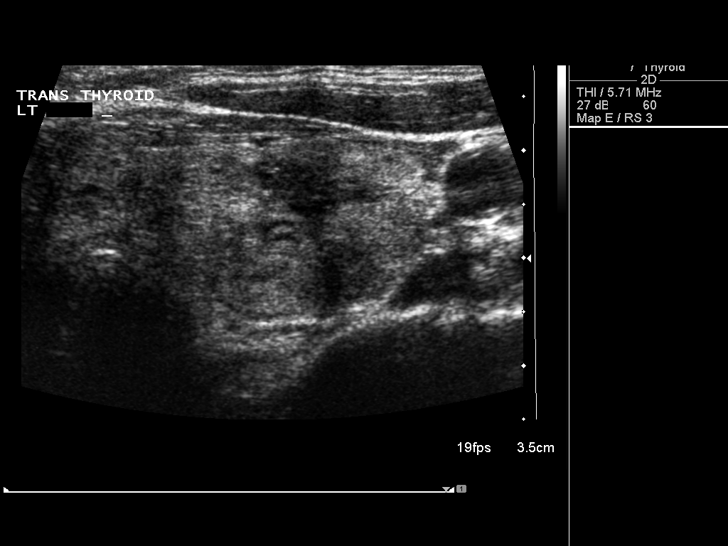
[im 54/81]
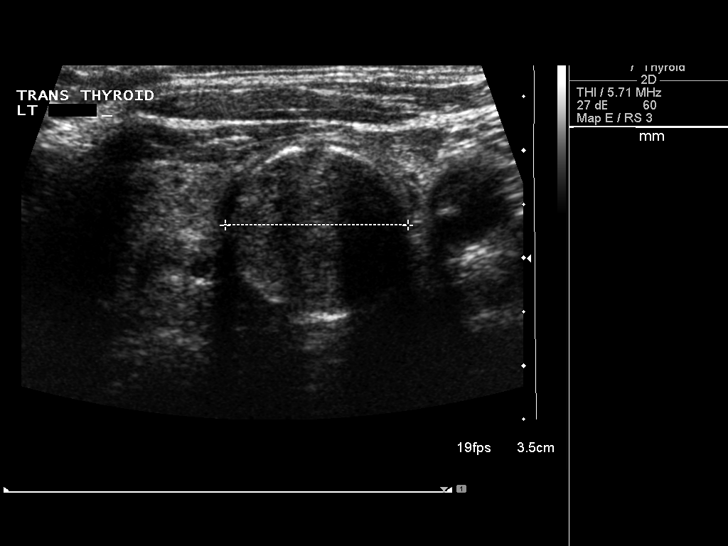
[im 61/81]
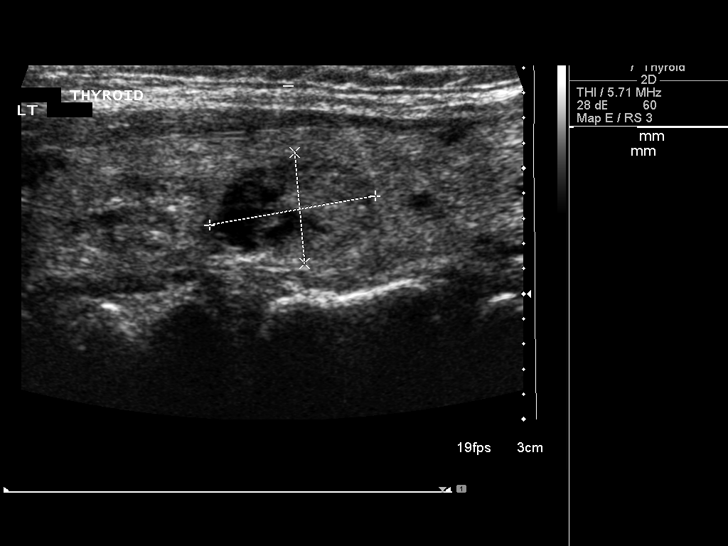
[im 67/81]
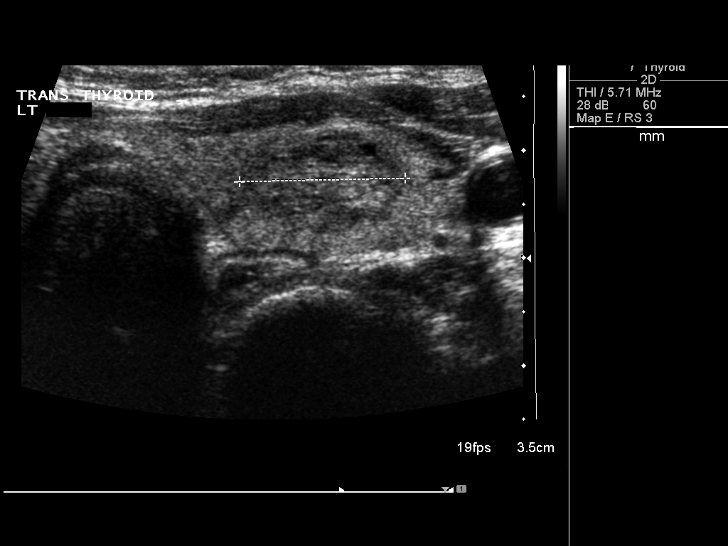
[im 74/81]
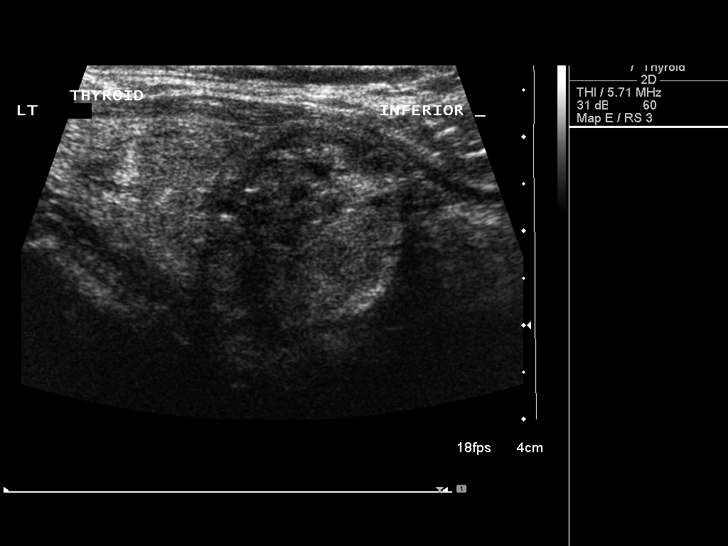
[im 81/81]
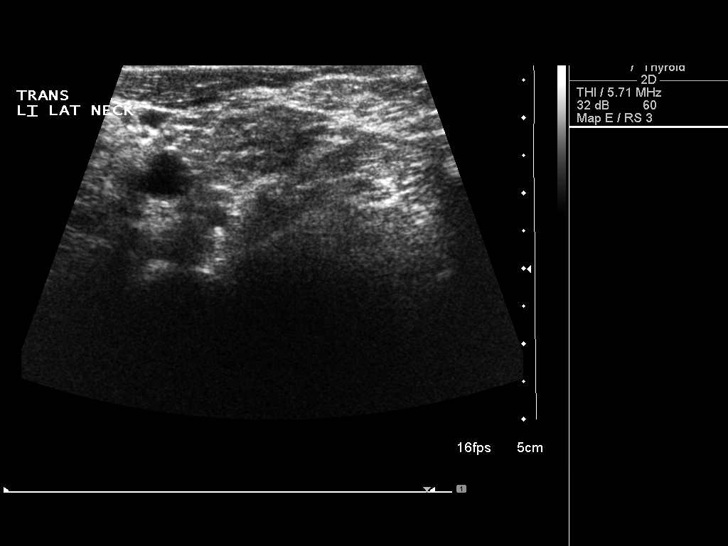

[14 of 25 positions shown; findings below may reference images not displayed]

FINDINGS: Right thyroid lobe

Measurements: 6.2 x 1.5 x 2.2 cm. Right upper pole nodule is not
clearly visualized. Right interpolar region nodule measures 1.6 x
1.0 x 1.3 cm and previously measured 1.5 x 1.1 x 1.2 cm. Right lower
pole nodule measures 1.5 x 1.2 x 1.3 cm and previously measured
x 1.1 x 1.3 cm.

Left thyroid lobe

Measurements: 7.4 x 2.2 x 2.1 cm.. Left lower pole nodule measures
1.6 x 1.7 x 1.7 cm with calcification and previously measured 1.6 x
1.6 x 1.6 cm. Interpolar region nodule measures 1.3 x 0.9 x 1.5 cm
and previously measured 1.6 x 1.0 x 1.2 cm. Left upper pole nodule
measures 1.3 x 0.9 x 1.3 cm which is not significantly changed.

Isthmus

Thickness: 9 mm in thickness. Right isthmic nodule measures 1.8 x
0.9 x 1.3 cm and previously measured 1.8 x 1.0 x 1.0 cm

Lymphadenopathy

None visualized.
IMPRESSION: Multiple thyroid nodules are not significantly changed.

## 2014-01-14 ENCOUNTER — Encounter (INDEPENDENT_AMBULATORY_CARE_PROVIDER_SITE_OTHER): Payer: Self-pay | Admitting: Surgery

## 2014-01-14 ENCOUNTER — Ambulatory Visit (INDEPENDENT_AMBULATORY_CARE_PROVIDER_SITE_OTHER): Payer: BC Managed Care – PPO | Admitting: Surgery

## 2014-01-14 VITALS — BP 118/82 | HR 64 | Resp 14 | Ht 65.0 in | Wt 176.6 lb

## 2014-01-14 DIAGNOSIS — E049 Nontoxic goiter, unspecified: Secondary | ICD-10-CM

## 2014-01-14 DIAGNOSIS — E041 Nontoxic single thyroid nodule: Secondary | ICD-10-CM

## 2014-01-14 NOTE — Progress Notes (Signed)
Subjective:     Patient ID: Laura Underwood, female   DOB: [birth year], 63 y.o.   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 She has no complaints.She has been followed since 2013 and had previous benign biopsy.  She has no complaints.    Review of Systems  Constitutional: Negative.   Respiratory: Positive for cough.   Neurological: Negative for tremors.       Objective:   Physical Exam  Constitutional: She appears well-developed and well-nourished.  HENT:  Head: Normocephalic and atraumatic.  Neck: Normal range of motion. Neck supple. Thyromegaly present.  Cardiovascular: Normal rate and regular rhythm.   Pulmonary/Chest: Effort normal and breath sounds normal.  Lymphadenopathy:    She has no cervical adenopathy.  IMPRESSION:  No significant change in the appearance of multinodular thyroid  goiter. One of the left-sided nodules may be slightly smaller in  size. All other demonstrated solid and partially cystic nodules  are stable in size and appearance by ultrasound.   U/S CLINICAL DATA: Follow-up nodules  EXAM:  THYROID ULTRASOUND  TECHNIQUE:  Ultrasound examination of the thyroid gland and adjacent soft  tissues was performed.  COMPARISON: 07/30/2013  FINDINGS:  Right thyroid lobe  Measurements: 6.2 x 1.5 x 2.2 cm. Right upper pole nodule is not  clearly visualized. Right interpolar region nodule measures 1.6 x  1.0 x 1.3 cm and previously measured 1.5 x 1.1 x 1.2 cm. Right lower  pole nodule measures 1.5 x 1.2 x 1.3 cm and previously measured 1.6  x 1.1 x 1.3 cm.  Left thyroid lobe  Measurements: 7.4 x 2.2 x 2.1 cm. Left lower pole nodule measures  1.6 x 1.7 x 1.7 cm with calcification and previously measured 1.6 x  1.6 x 1.6 cm. Interpolar region nodule measures 1.3 x 0.9 x 1.5 cm  and previously measured 1.6 x 1.0 x 1.2 cm. Left upper pole nodule  measures 1.3 x 0.9 x 1.3 cm which is not significantly changed.   Isthmus  Thickness: 9 mm in thickness. Right isthmic nodule measures 1.8 x  0.9 x 1.3 cm and previously measured 1.8 x 1.0 x 1.0 cm  Lymphadenopathy  None visualized.  IMPRESSION:  Multiple thyroid nodules are not significantly changed.  Electronically Signed  By: Maryclare Bean M.D.  On: 01/01/2014 12:08     Assessment:     Multinodular goiter stable    Plan:     Return  As needed.

## 2014-01-14 NOTE — Patient Instructions (Signed)
Return as needed.  No further follow up

## 2014-02-07 ENCOUNTER — Encounter: Payer: Self-pay | Admitting: Pulmonary Disease

## 2014-02-07 ENCOUNTER — Ambulatory Visit (INDEPENDENT_AMBULATORY_CARE_PROVIDER_SITE_OTHER): Payer: BC Managed Care – PPO | Admitting: Pulmonary Disease

## 2014-02-07 VITALS — BP 132/76 | HR 74 | Ht 65.0 in | Wt 177.0 lb

## 2014-02-07 DIAGNOSIS — R059 Cough, unspecified: Secondary | ICD-10-CM | POA: Diagnosis not present

## 2014-02-07 DIAGNOSIS — R05 Cough: Secondary | ICD-10-CM | POA: Diagnosis not present

## 2014-02-07 DIAGNOSIS — R053 Chronic cough: Secondary | ICD-10-CM

## 2014-02-07 DIAGNOSIS — J3089 Other allergic rhinitis: Secondary | ICD-10-CM | POA: Diagnosis not present

## 2014-02-07 MED ORDER — TRAMADOL HCL 50 MG PO TABS: 50.0000 mg | ORAL_TABLET | Freq: Four times a day (QID) | ORAL | Status: DC | PRN

## 2014-02-07 NOTE — Assessment & Plan Note (Addendum)
This is going to be a challenging case. I am basically got picked up where my partner left often 2012. She has had an extensive workup up until this point. This has included a negative esophageal pH probe of which I have reviewed the raw data and agree that there is no evidence of acid reflux. Further, she does have postnasal drip but it doesn't sound like that is the primary contributor.  In clinic she has a dry cough which almost sounds intentional. I wonder if this is not a habitual/neurogenic cough.  A much less likely cause (in very rare) would be eosinophilic bronchitis. These people typically have a negative methacholine challenge test in response to inhaled steroids. I don't think she has this but it's easy to assess for it but just starting inhaled corticosteroids.  Plan: -Methacholine challenge -Trial Advair - Chest x-ray -Voice rest and tramadol for cough suppression temporarily -If no evidence of improvement then would resume treatment for irritable larynx by restarting gabapentin and possibly Elavil.

## 2014-02-07 NOTE — Progress Notes (Signed)
Subjective:    Patient ID: Laura Underwood, female    DOB: 05-16-1951, 63 y.o.   MRN: [id number]  HPI  Here for cough  Mr. Barron has a chronic cough which as lasted for 6 years and she is here to see me for the same.  She has sen a physician at [facility] who said she had acid reflux and some sort of "neurologic problem" regarding the cough.  She took elavil, gabapentin, and PPI therapy.  She had an endoscopy recently and was told that there was no significant changes of GERD. She has also been seen by an allergist for the cough as well. She was told she had allergies to trees, ragweed, and cats.  She has been seen by one of my partners, Dr. Gwenette Greet.  She was told to stop talking for three days and take a narcotic based cough supressant which helped.  Childhood was notable for "pretty bad" asthma.  She remembers being on a "strong medicine" but she doesn't remember the name.  She had allergies over the years as well.  She never smoked but she had a lot of second hand smoke exposure.  She has a daughter with severe asthma who is not bothered by cough.  She remembers having the flu and a dry cough over the years.  She felt like the dry cough persistently after the cough.  In fact, she remembers that episode of flu as the beginning of this illness.  Every since then she has had a dry cough.    She notes stress incontinence with the cough which is bothersome.    She really hasn't been able to identify a trigger for the cough. The cough is definitely at home than anywhere else.  Recently she left home and the cough improved.  The rest of her family doesn't seem to be affected by the house. The house doesn't have a basement, no indoor pets.   She thinks that the cough is worse in the fall when her post nasal drip is bad, but the cough persists even when she doesn't have this problem.  Eating seems to make the cough worse.  She says it is worse after eating, not during meals.  She feels an  itching sensation in her throat and this leads to the cough.  She doesn't cough at night when lying flat.  She typically coughs a lot in the morning when she wakes up.    Past Medical History  Diagnosis Date  . Diverticulosis   . Cough   . Shortness of breath   . Colon polyp   . Irritable bowel disease   . Abdominal pain, other specified site   . Hemorrhoids   . Stress   . Dizziness   . Headache(784.0)   . Wears glasses   . Arthritis   . Menopause   . GERD (gastroesophageal reflux disease)   . Chronic cough     s/p evaluation by allergist and pulmonologist   . Hiatal hernia   . Goiter      Family History  Problem Relation Age of Onset  . Stroke Mother   . Hypertension Mother   . Hyperlipidemia Mother   . Depression Mother   . Hypertension Sister   . Prostate cancer Father   . Cancer Father     metastatic liver  . Cancer Maternal Aunt     breast  . Depression Maternal Aunt   . Depression Maternal Grandfather   . Depression Cousin   .  Cancer Paternal Aunt     pancreawtic  . Cancer Paternal Grandmother     pancreatic     History   Social History  . Marital Status: Married    Spouse Name: N/A    Number of Children: Y  . Years of Education: N/A   Occupational History  . RN, [employer], coder,office mgr.    Social History Main Topics  . Smoking status: Never Smoker   . Smokeless tobacco: Never Used  . Alcohol Use: 0.0 oz/week    1-2 Cans of beer per week  . Drug Use: No  . Sexual Activity: Not on file   Other Topics Concern  . Not on file   Social History Narrative   Lives in [place]. 3 children, daughters live nearby, son lives in [place].      Work - Research officer, political party and coding, Therapist, sports      Diet - healthy, limited caffeine     Allergies  Allergen Reactions  . Latex   . Nickel   . Neosporin [Neomycin-Bacitracin Zn-Polymyx] Rash     Outpatient Prescriptions Prior to Visit  Medication Sig Dispense Refill  . aspirin 81 MG tablet Take 81  mg by mouth daily.      . cetirizine (ZYRTEC) 10 MG tablet Take 10 mg by mouth daily.      Marland Kitchen docusate sodium (COLACE) 100 MG capsule Take 100 mg by mouth 2 (two) times daily.      . folic acid (FOLVITE) 381 MCG tablet Take 400 mcg by mouth daily.      . Misc Natural Products (TART CHERRY ADVANCED PO) Take 1,200 mg by mouth daily.      . Multiple Vitamin (MULTIVITAMIN) tablet Take 1 tablet by mouth daily.      . Omega-3 Fatty Acids (FISH OIL) 1200 MG CAPS Take by mouth.       No facility-administered medications prior to visit.      Review of Systems  Constitutional: Negative for fever and unexpected weight change.  HENT: Negative for congestion, dental problem, ear pain, nosebleeds, postnasal drip, rhinorrhea, sinus pressure, sneezing, sore throat and trouble swallowing.   Eyes: Negative for redness and itching.  Respiratory: Positive for cough. Negative for chest tightness, shortness of breath and wheezing.   Cardiovascular: Negative for palpitations and leg swelling.  Gastrointestinal: Negative for nausea and vomiting.  Genitourinary: Negative for dysuria.  Musculoskeletal: Negative for joint swelling.  Skin: Negative for rash.  Neurological: Negative for headaches.  Hematological: Does not bruise/bleed easily.  Psychiatric/Behavioral: Negative for dysphoric mood. The patient is not nervous/anxious.        Objective:   Physical Exam Filed Vitals:   02/07/14 1332  BP: 132/76  Pulse: 74  Height: 5\' 5"  (1.651 m)  Weight: 177 lb (80.287 kg)  SpO2: 99%   Gen: well appearing, no acute distress HEENT: NCAT, PERRL, EOMi, OP clear, neck supple without masses PULM: CTA B CV: RRR, no mgr, no JVD AB: BS+, soft, nontender, no hsm Ext: warm, no edema, no clubbing, no cyanosis Derm: no rash or skin breakdown Neuro: A&Ox4, CN II-XII intact, strength 5/5 in all 4 extremities  2012 simple spirometry reviewed> completely normal      Assessment & Plan:   Chronic cough This is  going to be a challenging case. I am basically got picked up where my partner left often 2012. She has had an extensive workup up until this point. This has included a negative esophageal pH probe of  which I have reviewed the raw data and agree that there is no evidence of acid reflux. Further, she does have postnasal drip but it doesn't sound like that is the primary contributor.  In clinic she has a dry cough which almost sounds intentional. I wonder if this is not a habitual/neurogenic cough.  A much less likely cause (in very rare) would be eosinophilic bronchitis. These people typically have a negative methacholine challenge test in response to inhaled steroids. I don't think she has this but it's easy to assess for it but just starting inhaled corticosteroids.  Plan: -Methacholine challenge -Trial Advair - Chest x-ray -Voice rest and tramadol for cough suppression temporarily -If no evidence of improvement then would resume treatment for irritable larynx by restarting gabapentin and possibly Elavil.  Allergic rhinitis Again, I don't think this is the primary cause of her cough but it certainly is not helping. Right now she is having a mild exacerbation of her allergic rhinitis.  Plan: -Restart Nasonex -Use chlorpheniramine/phenylephrine combination tablets    Updated Medication List Outpatient Encounter Prescriptions as of 02/07/2014  Medication Sig  . aspirin 81 MG tablet Take 81 mg by mouth daily.  . cetirizine (ZYRTEC) 10 MG tablet Take 10 mg by mouth daily.  Marland Kitchen docusate sodium (COLACE) 100 MG capsule Take 100 mg by mouth 2 (two) times daily.  . folic acid (FOLVITE) 032 MCG tablet Take 400 mcg by mouth daily.  . Misc Natural Products (TART CHERRY ADVANCED PO) Take 1,200 mg by mouth daily.  . Multiple Vitamin (MULTIVITAMIN) tablet Take 1 tablet by mouth daily.  . Omega-3 Fatty Acids (FISH OIL) 1200 MG CAPS Take by mouth.  . traMADol (ULTRAM) 50 MG tablet Take 1 tablet (50 mg  total) by mouth every 6 (six) hours as needed (cough).

## 2014-02-07 NOTE — Assessment & Plan Note (Signed)
Again, I don't think this is the primary cause of her cough but it certainly is not helping. Right now she is having a mild exacerbation of her allergic rhinitis.  Plan: -Restart Nasonex -Use chlorpheniramine/phenylephrine combination tablets

## 2014-02-07 NOTE — Patient Instructions (Signed)
You need to try to suppress your cough to allow your larynx (voice box) to heal.  For three days don't talk, laugh, sing, or clear your throat. Do everything you can to suppress the cough during this time. Use hard candies (sugarless Jolly Ranchers) or non-mint or non-menthol containing cough drops during this time to soothe your throat.  Use a cough suppressant (tramadol 50 to 100mg  every 6 hours) around the clock during this time.  After three days, gradually increase the use of your voice and back off on the cough suppressants to be used on an as needed basis only.  For the allergies, take Nasonex 2 puffs each nostril daily and Chlorpheniramine.  We will arrange a methacholine challenge test and chest x-ray at [facility]  Take the advair one puff twice a day for a month no matter how you feel  We will see you back in 4 weeks or sooner if needed

## 2014-02-10 ENCOUNTER — Encounter: Payer: Self-pay | Admitting: Pulmonary Disease

## 2014-02-11 ENCOUNTER — Ambulatory Visit: Payer: Self-pay | Admitting: Pulmonary Disease

## 2014-02-11 IMAGING — CR DG CHEST 2V
1 series · 2 of 2 positions shown · non-contrast
Comparison: 11/19/2013

CLINICAL DATA: Long-term cough

EXAM:
CHEST  2 VIEW

[Series 1: dxr chest pa (or ap) and lateral · 0.14mm/px · 2 of 2 slices shown]
[im 1/2]
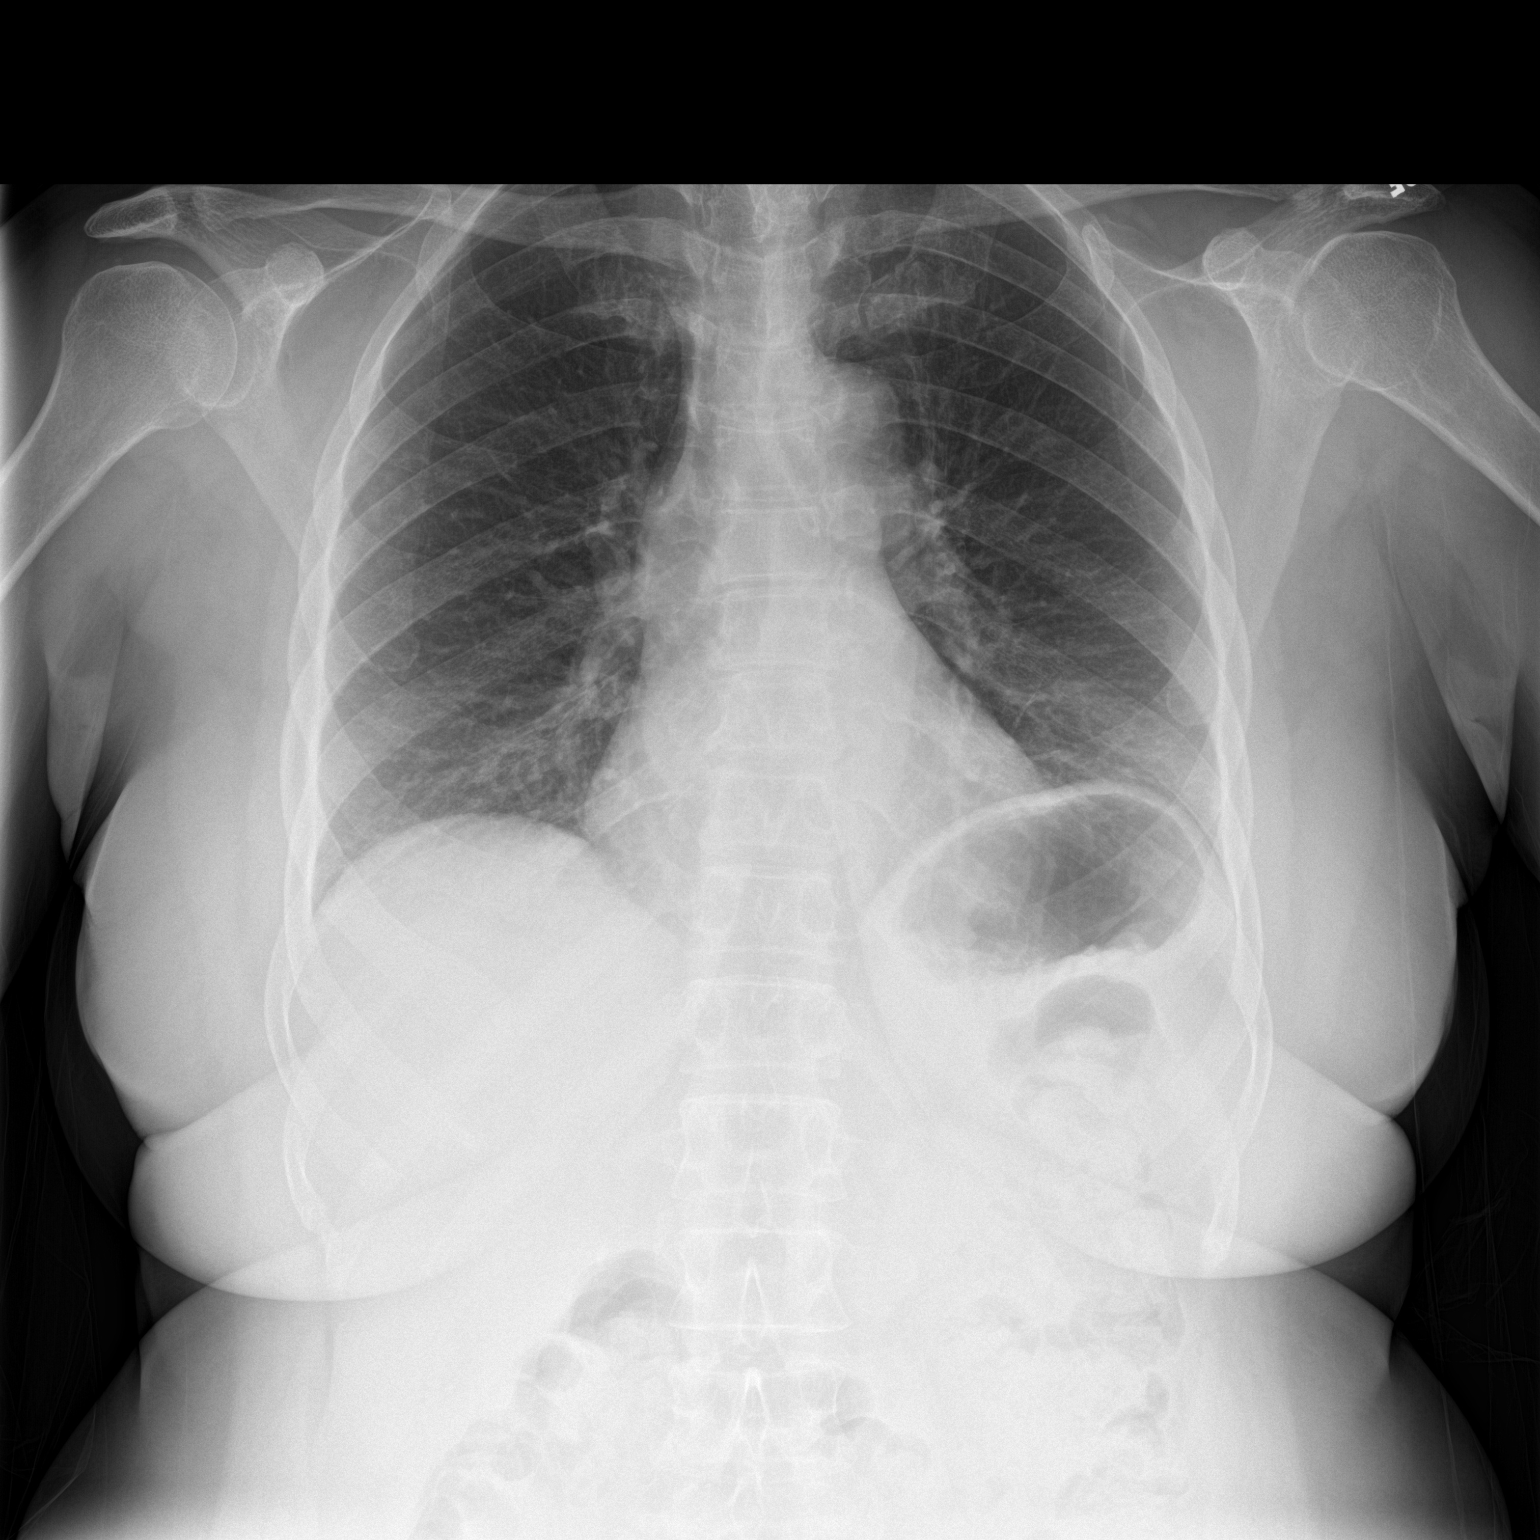
[im 2/2]
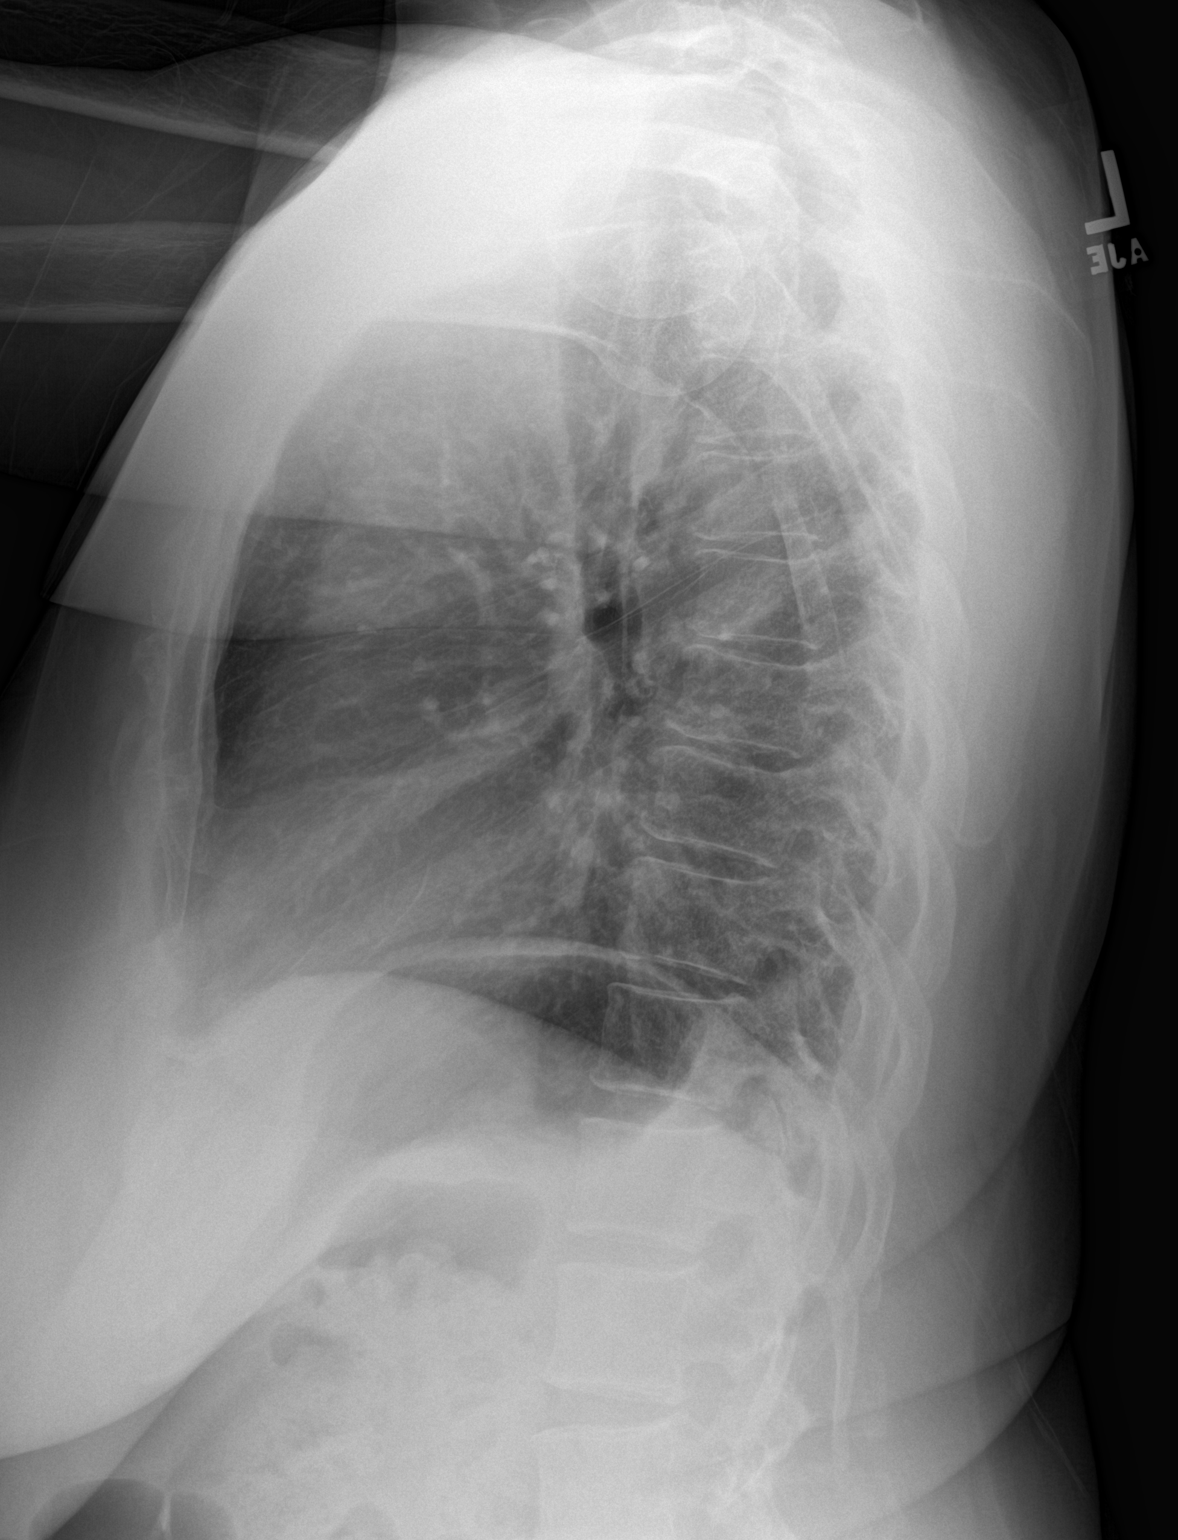

[2 of 2 positions shown; findings below may reference images not displayed]

FINDINGS: Cardiac shadow is stable. The lungs are clear bilaterally. No acute
infiltrate is noted.
IMPRESSION: No acute abnormality noted.

## 2014-02-11 NOTE — Telephone Encounter (Signed)
8.30.15 mychart message from pt: Message     Hello Dr. Lake Bells,    [facility] says they have not received a prescription for the Tramadol you ordered.    I have checked twice. I use the [facility] at :    [address].    [name]    [phone]    I also have a recent chest xray that Dr.Walker ordered in June so I will let the hospital know when I have the methacholine test on 02/11/14.    Thank you very much,    Laura Underwood    Pt seen 8.27.15 by BQ: Patient Instructions     You need to try to suppress your cough to allow your larynx (voice box) to heal. For three days don't talk, laugh, sing, or clear your throat. Do everything you can to suppress the cough during this time.  Use hard candies (sugarless Jolly Ranchers) or non-mint or non-menthol containing cough drops during this time to soothe your throat. Use a cough suppressant (tramadol 50 to 100mg  every 6 hours) around the clock during this time. After three days, gradually increase the use of your voice and back off on the cough suppressants to be used on an as needed basis only.  For the allergies, take Nasonex 2 puffs each nostril daily and Chlorpheniramine.  We will arrange a methacholine challenge test and chest x-ray at [facility]  Take the advair one puff twice a day for a month no matter how you feel  We will see you back in 4 weeks or sooner if needed   The Tramadol rx was printed but the pharmacy never received [facility], spoke with pharmacist (unable to understand his name or how to spell it) Verbal authorization given for the Tramadol as written by BQ at the ov Email sent to pt notifying her of this  Will sign and forward to BQ to make him aware about the pending cxr.

## 2014-02-15 ENCOUNTER — Telehealth: Payer: Self-pay

## 2014-02-15 NOTE — Telephone Encounter (Signed)
Spoke with pt, she is aware of results.  Nothing further needed.  

## 2014-02-15 NOTE — Telephone Encounter (Signed)
Message copied by Len Blalock on Fri Feb 15, 2014  4:37 PM ------      Message from: Juanito Doom      Created: Fri Feb 15, 2014 11:22 AM       A,            Please let her know that her CXR was OK            Thanks      B ------

## 2014-02-28 ENCOUNTER — Ambulatory Visit: Payer: BC Managed Care – PPO | Admitting: Pulmonary Disease

## 2014-02-28 ENCOUNTER — Encounter: Payer: Self-pay | Admitting: Pulmonary Disease

## 2014-02-28 ENCOUNTER — Ambulatory Visit (INDEPENDENT_AMBULATORY_CARE_PROVIDER_SITE_OTHER): Payer: BC Managed Care – PPO | Admitting: Pulmonary Disease

## 2014-02-28 VITALS — BP 116/66 | HR 61 | Ht 65.0 in | Wt 175.0 lb

## 2014-02-28 DIAGNOSIS — R059 Cough, unspecified: Secondary | ICD-10-CM

## 2014-02-28 DIAGNOSIS — R05 Cough: Secondary | ICD-10-CM

## 2014-02-28 DIAGNOSIS — R053 Chronic cough: Secondary | ICD-10-CM

## 2014-02-28 MED ORDER — E-Z SPACER DEVI: Status: DC

## 2014-02-28 MED ORDER — TRAMADOL HCL 50 MG PO TABS: 50.0000 mg | ORAL_TABLET | Freq: Four times a day (QID) | ORAL | Status: DC | PRN

## 2014-02-28 MED ORDER — BECLOMETHASONE DIPROPIONATE 80 MCG/ACT IN AERS: 2.0000 | INHALATION_SPRAY | Freq: Two times a day (BID) | RESPIRATORY_TRACT | Status: DC

## 2014-02-28 NOTE — Progress Notes (Signed)
Subjective:    Patient ID: Laura Underwood, female    DOB: 1951/01/02, 63 y.o.   MRN: [id number]  Synopsis: Laura Underwood reestablish care with [facility] in 2015 for chronic cough with upper airway cough syndrome, positive methacholine challenge test, and possible acid reflux. Previously treated with gabapentin and Elavil for laryngeal sensitivity leading to cough. She had a normal simple spirometry test in 2012.  HPI  02/28/2014 ROV> Druing her three days of voice rest Zehava she had a significant improvement in her cough.  However after that she backed off on the tramadol and her cough came back. This has not been associated with significant wheezing or shortness of breath. However, she says that when she was taking the Advair her cough is significantly better as well. When the sample of Advair ran out she says her cough started to return. I do not have records of the methacholine challenge test, but she describes worsening shortness of breath during the test. She had to be treated with albuterol twice.  Past Medical History  Diagnosis Date  . Diverticulosis   . Cough   . Shortness of breath   . Colon polyp   . Irritable bowel disease   . Abdominal pain, other specified site   . Hemorrhoids   . Stress   . Dizziness   . Headache(784.0)   . Wears glasses   . Arthritis   . Menopause   . GERD (gastroesophageal reflux disease)   . Chronic cough     s/p evaluation by allergist and pulmonologist   . Hiatal hernia   . Goiter      Review of Systems     Objective:   Physical Exam Filed Vitals:   02/28/14 1049  BP: 116/66  Pulse: 61  Height: 5\' 5"  (1.651 m)  Weight: 175 lb (79.379 kg)  SpO2: 98%    Gen: well appearing, no acute distress HEENT: NCAT, EOMi, OP clear PULM: CTA B CV: RRR, no mgr, no JVD AB: BS+, soft, nontender Ext: warm, no edema, no clubbing, no cyanosis Derm: no rash or skin breakdown Neuro: A&Ox4, MAEW      Assessment & Plan:   Chronic  cough Though I don't have the results here yet, it sounds like her methacholine challenge test was positive.  This may mean that she has some low level asthma, though I don't think that her asthma is severe.  At this point, I think it is reasonable to treat for asthma versus eosinophilic bronchitis with an inhaled corticosteroid. However, I think there is a significant degree of upper airway inflammation and laryngeal sensitivity. It does not sound like she has significant acid reflux.  Plan: -Start Qvar 80 mcg 2 puffs twice a day -Restart tramadol to use frequently for the next 4-5 days while we wait for the Qvar to work -I will have her followup with me in 3 months, but if her symptoms do not get better in a month I have asked her to call us. If not better then she should take Prilosec 40 once daily and wait for 2 weeks. If not better at that point she may need to be started on gabapentin and Elavil again for cyclical cough.  Obviously we would need to see her in clinic if we prescribe those medications.    Updated Medication List Outpatient Encounter Prescriptions as of 02/28/2014  Medication Sig  . aspirin 81 MG tablet Take 81 mg by mouth daily.  . chlorpheniramine (CHLOR-TRIMETON) 4 MG  tablet Take 4 mg by mouth every 6 (six) hours as needed for allergies.  Marland Kitchen docusate sodium (COLACE) 100 MG capsule Take 100 mg by mouth 2 (two) times daily.  . folic acid (FOLVITE) 408 MCG tablet Take 400 mcg by mouth daily.  . Misc Natural Products (TART CHERRY ADVANCED PO) Take 1,200 mg by mouth daily.  . Multiple Vitamin (MULTIVITAMIN) tablet Take 1 tablet by mouth daily.  . Omega-3 Fatty Acids (FISH OIL) 1200 MG CAPS Take by mouth.  . traMADol (ULTRAM) 50 MG tablet Take 1 tablet (50 mg total) by mouth every 6 (six) hours as needed (cough).  . cetirizine (ZYRTEC) 10 MG tablet Take 10 mg by mouth daily.

## 2014-02-28 NOTE — Assessment & Plan Note (Addendum)
Though I don't have the results here yet, it sounds like her methacholine challenge test was positive.  This may mean that she has some low level asthma, though I don't think that her asthma is severe.  At this point, I think it is reasonable to treat for asthma versus eosinophilic bronchitis with an inhaled corticosteroid. However, I think there is a significant degree of upper airway inflammation and laryngeal sensitivity. It does not sound like she has significant acid reflux.  Plan: -Start Qvar 80 mcg 2 puffs twice a day -Restart tramadol to use frequently for the next 4-5 days while we wait for the Qvar to work -I will have her followup with me in 3 months, but if her symptoms do not get better in a month I have asked her to call us. If not better then she should take Prilosec 40 once daily and wait for 2 weeks. If not better at that point she may need to be started on gabapentin and Elavil again for cyclical cough.  Obviously we would need to see her in clinic if we prescribe those medications.

## 2014-02-28 NOTE — Patient Instructions (Signed)
Take the tramadol as needed for cough, especially for the next 4-5 days Take the QVar with a spacer two puffs twice per day no matter how you feel Try using albuterol 2 puffs every four hours as needed for coughing or wheezing We will see you back in 3 months or sooner if needed

## 2014-03-11 ENCOUNTER — Encounter: Payer: Self-pay | Admitting: Pulmonary Disease

## 2014-03-15 ENCOUNTER — Encounter: Payer: Self-pay | Admitting: Pulmonary Disease

## 2014-03-15 MED ORDER — ALBUTEROL SULFATE HFA 108 (90 BASE) MCG/ACT IN AERS: 2.0000 | INHALATION_SPRAY | RESPIRATORY_TRACT | Status: DC | PRN

## 2014-05-14 LAB — HM DIABETES EYE EXAM

## 2014-05-23 LAB — HM MAMMOGRAPHY: HM Mammogram: NORMAL

## 2014-05-24 ENCOUNTER — Ambulatory Visit (INDEPENDENT_AMBULATORY_CARE_PROVIDER_SITE_OTHER): Payer: BC Managed Care – PPO | Admitting: Internal Medicine

## 2014-05-24 ENCOUNTER — Encounter: Payer: Self-pay | Admitting: Internal Medicine

## 2014-05-24 VITALS — BP 122/80 | HR 78 | Temp 97.9°F | Resp 14 | Ht 65.0 in | Wt 179.5 lb

## 2014-05-24 DIAGNOSIS — E669 Obesity, unspecified: Secondary | ICD-10-CM

## 2014-05-24 DIAGNOSIS — Z0000 Encounter for general adult medical examination without abnormal findings: Secondary | ICD-10-CM | POA: Insufficient documentation

## 2014-05-24 LAB — COMPREHENSIVE METABOLIC PANEL
ALBUMIN: 3.8 g/dL (ref 3.5–5.2)
ALT: 19 U/L (ref 0–35)
AST: 21 U/L (ref 0–37)
Alkaline Phosphatase: 41 U/L (ref 39–117)
BUN: 14 mg/dL (ref 6–23)
CALCIUM: 9.1 mg/dL (ref 8.4–10.5)
CHLORIDE: 107 meq/L (ref 96–112)
CO2: 26 mEq/L (ref 19–32)
Creatinine, Ser: 0.7 mg/dL (ref 0.4–1.2)
GFR: 118.61 mL/min (ref >60.00)
Glucose, Bld: 99 mg/dL (ref 70–99)
Potassium: 4.4 mEq/L (ref 3.5–5.1)
Sodium: 138 mEq/L (ref 135–145)
Total Bilirubin: 0.4 mg/dL (ref 0.2–1.2)
Total Protein: 6.3 g/dL (ref 6.0–8.3)

## 2014-05-24 LAB — CBC WITH DIFFERENTIAL/PLATELET
BASOS ABS: 0 10*3/uL (ref 0.0–0.1)
Basophils Relative: 0.4 % (ref 0.0–3.0)
EOS ABS: 0.2 10*3/uL (ref 0.0–0.7)
Eosinophils Relative: 3.4 % (ref 0.0–5.0)
HCT: 39.2 % (ref 36.0–46.0)
Hemoglobin: 12.2 g/dL (ref 12.0–15.0)
Lymphocytes Relative: 30.4 % (ref 12.0–46.0)
Lymphs Abs: 1.6 10*3/uL (ref 0.7–4.0)
MCHC: 31.2 g/dL (ref 30.0–36.0)
MCV: 84.7 fl (ref 78.0–100.0)
MONOS PCT: 7.7 % (ref 3.0–12.0)
Monocytes Absolute: 0.4 10*3/uL (ref 0.1–1.0)
NEUTROS PCT: 58.1 % (ref 43.0–77.0)
Neutro Abs: 3.1 10*3/uL (ref 1.4–7.7)
Platelets: 262 10*3/uL (ref 150.0–400.0)
RBC: 4.62 Mil/uL (ref 3.87–5.11)
RDW: 14.4 % (ref 11.5–15.5)
WBC: 5.3 10*3/uL (ref 4.0–10.5)

## 2014-05-24 LAB — MICROALBUMIN / CREATININE URINE RATIO
Creatinine,U: 132.6 mg/dL
MICROALB UR: 2 mg/dL — AB (ref 0.0–1.9)
MICROALB/CREAT RATIO: 1.5 mg/g (ref 0.0–30.0)

## 2014-05-24 LAB — LIPID PANEL
Cholesterol: 154 mg/dL (ref 0–200)
HDL: 46.2 mg/dL (ref >39.00)
LDL Cholesterol: 103 mg/dL — ABNORMAL HIGH (ref 0–99)
NONHDL: 107.8
Total CHOL/HDL Ratio: 3
Triglycerides: 24 mg/dL (ref 0.0–149.0)
VLDL: 4.8 mg/dL (ref 0.0–40.0)

## 2014-05-24 LAB — VITAMIN B12: VITAMIN B 12: 626 pg/mL (ref 211–911)

## 2014-05-24 LAB — HEMOGLOBIN A1C: HEMOGLOBIN A1C: 6.4 % (ref 4.6–6.5)

## 2014-05-24 LAB — VITAMIN D 25 HYDROXY (VIT D DEFICIENCY, FRACTURES): VITD: 25.51 ng/mL — ABNORMAL LOW (ref 30.00–100.00)

## 2014-05-24 LAB — HM DIABETES FOOT EXAM: HM DIABETIC FOOT EXAM: NORMAL

## 2014-05-24 LAB — TSH: TSH: 0.3 u[IU]/mL — ABNORMAL LOW (ref 0.35–4.50)

## 2014-05-24 NOTE — Addendum Note (Signed)
Addended by: Johnsie Cancel on: 05/24/2014 08:43 AM   Modules accepted: Miquel Dunn

## 2014-05-24 NOTE — Progress Notes (Signed)
Subjective:    Patient ID: Laura Underwood, female    DOB: 11/08/1950, 63 y.o.   MRN: [id number]  HPI 63YO female presents for annual exam.  Cough has improved some except for mornings. Ventolin has been helpful at times. Using about 3 x per day and using Qvar twice daily. Also taking Zyrtec.  Exercising in the mornings.  Past medical, surgical, family and social history per today's encounter.  Review of Systems  Constitutional: Negative for fever, chills, appetite change, fatigue and unexpected weight change.  Eyes: Negative for visual disturbance.  Respiratory: Negative for shortness of breath.   Cardiovascular: Negative for chest pain and leg swelling.  Gastrointestinal: Negative for nausea, vomiting, abdominal pain, diarrhea and constipation.  Musculoskeletal: Negative for myalgias and arthralgias.  Skin: Negative for color change and rash.  Hematological: Negative for adenopathy. Does not bruise/bleed easily.  Psychiatric/Behavioral: Negative for dysphoric mood. The patient is not nervous/anxious.        Objective:    BP 122/80 mmHg  Pulse 78  Temp(Src) 97.9 F (36.6 C) (Oral)  Resp 14  Ht 5\' 5"  (1.651 m)  Wt 179 lb 8 oz (81.421 kg)  BMI 29.87 kg/m2  SpO2 99% Physical Exam  Constitutional: She is oriented to person, place, and time. She appears well-developed and well-nourished. No distress.  HENT:  Head: Normocephalic and atraumatic.  Right Ear: External ear normal.  Left Ear: External ear normal.  Nose: Nose normal.  Mouth/Throat: Oropharynx is clear and moist. No oropharyngeal exudate.  Eyes: Conjunctivae are normal. Pupils are equal, round, and reactive to light. Right eye exhibits no discharge. Left eye exhibits no discharge. No scleral icterus.  Neck: Normal range of motion. Neck supple. No tracheal deviation present. No thyromegaly present.  Cardiovascular: Normal rate, regular rhythm, normal heart sounds and intact distal pulses.  Exam reveals no gallop  and no friction rub.   No murmur heard. Pulmonary/Chest: Effort normal and breath sounds normal. No accessory muscle usage. No tachypnea. No respiratory distress. She has no decreased breath sounds. She has no wheezes. She has no rales. She exhibits no tenderness. Right breast exhibits no inverted nipple, no mass, no nipple discharge, no skin change and no tenderness. Left breast exhibits no inverted nipple, no mass, no nipple discharge, no skin change and no tenderness. Breasts are symmetrical.  Abdominal: Soft. Bowel sounds are normal. She exhibits no distension and no mass. There is no tenderness. There is no rebound and no guarding.  Musculoskeletal: Normal range of motion. She exhibits no edema or tenderness.  Lymphadenopathy:    She has no cervical adenopathy.  Neurological: She is alert and oriented to person, place, and time. No cranial nerve deficit. She exhibits normal muscle tone. Coordination normal.  Skin: Skin is warm and dry. No rash noted. She is not diaphoretic. No erythema. No pallor.  Psychiatric: She has a normal mood and affect. Her behavior is normal. Judgment and thought content normal.          Assessment & Plan:   Problem List Items Addressed This Visit      Unprioritized   Obesity (BMI 30-39.9)    Wt Readings from Last 3 Encounters:  05/24/14 179 lb 8 oz (81.421 kg)  02/28/14 175 lb (79.379 kg)  02/07/14 177 lb (80.287 kg)   Body mass index is 29.87 kg/(m^2).  Encouraged continued effort a healthy diet and exercise.    Routine general medical examination at a health care facility - Primary  General medical exam including breast exam normal today. PAP and pelvic deferred as completed by OB and UTD. Mammogram UTD, results pending. Colonoscopy UTD. Immunizations UTD. Labs today including CBC, CMP, lipids, A1c, TSH. Encouraged continued healthy diet and exercise. Follow up in 3-6 months.    Relevant Orders      CBC with Differential      Comprehensive  metabolic panel      Lipid panel      Microalbumin / creatinine urine ratio      Vit D  25 hydroxy (rtn osteoporosis monitoring)      Hemoglobin A1c      TSH      B12      Ambulatory referral to Podiatry       Return in about 3 months (around 08/23/2014) for Recheck of Diabetes.

## 2014-05-24 NOTE — Assessment & Plan Note (Signed)
Wt Readings from Last 3 Encounters:  05/24/14 179 lb 8 oz (81.421 kg)  02/28/14 175 lb (79.379 kg)  02/07/14 177 lb (80.287 kg)   Body mass index is 29.87 kg/(m^2).  Encouraged continued effort a healthy diet and exercise.

## 2014-05-24 NOTE — Progress Notes (Signed)
Pre visit review using our clinic review tool, if applicable. No additional management support is needed unless otherwise documented below in the visit note. 

## 2014-05-24 NOTE — Patient Instructions (Signed)

## 2014-05-24 NOTE — Assessment & Plan Note (Signed)
General medical exam including breast exam normal today. PAP and pelvic deferred as completed by OB and UTD. Mammogram UTD, results pending. Colonoscopy UTD. Immunizations UTD. Labs today including CBC, CMP, lipids, A1c, TSH. Encouraged continued healthy diet and exercise. Follow up in 3-6 months.

## 2014-05-27 ENCOUNTER — Encounter: Payer: Self-pay | Admitting: *Deleted

## 2014-05-27 ENCOUNTER — Encounter: Payer: Self-pay | Admitting: Internal Medicine

## 2014-06-03 ENCOUNTER — Encounter: Payer: Self-pay | Admitting: Pulmonary Disease

## 2014-06-03 ENCOUNTER — Other Ambulatory Visit: Payer: Self-pay | Admitting: Pulmonary Disease

## 2014-06-03 MED ORDER — BECLOMETHASONE DIPROPIONATE 80 MCG/ACT IN AERS: 2.0000 | INHALATION_SPRAY | Freq: Two times a day (BID) | RESPIRATORY_TRACT | Status: DC

## 2014-06-05 ENCOUNTER — Encounter: Payer: Self-pay | Admitting: Internal Medicine

## 2014-06-10 ENCOUNTER — Ambulatory Visit (INDEPENDENT_AMBULATORY_CARE_PROVIDER_SITE_OTHER): Payer: BC Managed Care – PPO | Admitting: Podiatry

## 2014-06-10 ENCOUNTER — Encounter: Payer: Self-pay | Admitting: Podiatry

## 2014-06-10 ENCOUNTER — Ambulatory Visit (INDEPENDENT_AMBULATORY_CARE_PROVIDER_SITE_OTHER): Payer: BC Managed Care – PPO

## 2014-06-10 VITALS — BP 138/84 | HR 74 | Resp 16 | Ht 65.0 in | Wt 185.0 lb

## 2014-06-10 DIAGNOSIS — E119 Type 2 diabetes mellitus without complications: Secondary | ICD-10-CM

## 2014-06-10 DIAGNOSIS — M2040 Other hammer toe(s) (acquired), unspecified foot: Secondary | ICD-10-CM

## 2014-06-10 DIAGNOSIS — M19079 Primary osteoarthritis, unspecified ankle and foot: Secondary | ICD-10-CM

## 2014-06-10 DIAGNOSIS — M6740 Ganglion, unspecified site: Secondary | ICD-10-CM

## 2014-06-10 NOTE — Progress Notes (Signed)
   Subjective:    Patient ID: Laura Underwood, female    DOB: 01-21-1951, 63 y.o.   MRN: [id number]  HPI Comments: i have knots on toes on both feet, i have a knot on the top of my left foot, and i have thick toenails. Sometimes my feet will hurt. My toenails do not hurt. When i wear cute shoes and do some walking it will hurt my feet. i dont do anything for my feet.   Foot Pain Associated symptoms include abdominal pain, coughing and headaches.      Review of Systems  HENT: Positive for ear pain.        Ringing in ears   Eyes:       Dry eyes  Respiratory: Positive for cough.        Asthma   Cardiovascular: Positive for palpitations.  Gastrointestinal: Positive for abdominal pain.       Diverticulosis   Musculoskeletal:       Joint pain   Skin:       Change in nails  Neurological: Positive for headaches.  All other systems reviewed and are negative.      Objective:   Physical Exam: I have reviewed her past medical history medications allergies surgery social history and review of systems. Pulses are intact +2 over 4 DP and PT bilateral. Neurologic sensorium is intact per Semmes-Weinstein monofilament. Deep tendon reflexes are intact bilateral and muscle strength +5 over 5 dorsal flexors plantar flexors and inverters and everters all intrinsic musculature is intact. Orthopedic evaluation demonstrates all joints distal to the ankle for range of motion without crepitation. Mild flexible hammertoe deformities are noted and/or resulting in some of her concerns such a prominences overlying the PIPJ second DIPJ. She's also concerned of an area overlying the second DIPJ of the left foot which appears to be only a mucoid cyst. Cutaneous evaluation demonstrates supple well-hydrated cutis no erythema edema cellulitis drainage or odor. Some nail plates are thickened however do not appear to be fungal in nature. Radiographic evaluation does demonstrate osteoarthritis of the mid foot resulting  in some dorsal spurring which she also had concerns about but appears to be nonproblematic.        Assessment & Plan:  Assessment: Non-insulin-dependent diabetes mellitus without complications at this point. Mild flexible hammertoe deformities bilateral. Mucoid cyst second digit overlying DIPJ left. 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  Plan: We discussed the etiology pathology conservative versus surgical therapies we discussed appropriate shoe gear stretching exercises ice therapy and shoe modifications. At this point I encouraged her to continue all of her activities and she will follow up with me should these areas become more painful or worrisome.

## 2014-06-18 ENCOUNTER — Encounter: Payer: Self-pay | Admitting: Pulmonary Disease

## 2014-06-18 ENCOUNTER — Ambulatory Visit (INDEPENDENT_AMBULATORY_CARE_PROVIDER_SITE_OTHER): Payer: BC Managed Care – PPO | Admitting: Pulmonary Disease

## 2014-06-18 VITALS — BP 136/76 | HR 83 | Ht 65.0 in | Wt 182.0 lb

## 2014-06-18 DIAGNOSIS — R05 Cough: Secondary | ICD-10-CM

## 2014-06-18 DIAGNOSIS — R053 Chronic cough: Secondary | ICD-10-CM

## 2014-06-18 MED ORDER — TRAMADOL HCL 50 MG PO TABS: 50.0000 mg | ORAL_TABLET | Freq: Four times a day (QID) | ORAL | Status: DC | PRN

## 2014-06-18 NOTE — Patient Instructions (Signed)
Take the tramadol every 6 hours as needed for cough Take the Breo one puff a day instead of the QVar. Take the albuterol as you are doing Protect those vocal cords! Keep the ice water nearby, minimize talking much when the cough is bad, and finally don't clear your throat, drink water instead. Come back in 6 weeks, if you're not better we will restart the neurontin again

## 2014-06-18 NOTE — Progress Notes (Signed)
Subjective:    Patient ID: Laura Underwood, female    DOB: [birth year], 64 y.o.   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 Previously treated with gabapentin and Elavil for laryngeal sensitivity leading to cough. She had a normal simple spirometry test in 2012.  She has had a history of a negative esophageal pH probe, chest x-ray in June 2015 was normal.  HPI  Chief Complaint  Patient presents with  . Follow-up    Pt states the qvar does not help her coughing but the ventolin does.  Pt c/o nonprod cough throughout day, worse with exertion.      Laura Underwood had a good holiday and spent a lot of time with family.  She says that the QVar really didn't help much, but the ventolin does help with the coughing spells. The cough continues to be a problem, it is dry and non-productive.  She denies any shortness of breath aside from when she has severe coughing.  No wheezing.  The cough is definitely worse with talking a lot to people, and more when she is at home.  The cough is not a problem when she is asleep or lying down.  She is not taking any acid reflux medication.    Past Medical History  Diagnosis Date  . Diverticulosis   . Cough   . Shortness of breath   . Colon polyp   . Irritable bowel disease   . Abdominal pain, other specified site   . Hemorrhoids   . Stress   . Dizziness   . Headache(784.0)   . Wears glasses   . Arthritis   . Menopause   . GERD (gastroesophageal reflux disease)   . Chronic cough     s/p evaluation by allergist and pulmonologist   . Hiatal hernia   . Goiter      Review of Systems     Objective:   Physical Exam Filed Vitals:   06/18/14 0935  BP: 136/76  Pulse: 83  Height: 5\' 5"  (1.651 m)  Weight: 182 lb (82.555 kg)  SpO2: 94%    Gen: well appearing, no acute distress HEENT: NCAT, EOMi,  OP clear PULM: CTA B CV: RRR, no mgr, no JVD AB: BS+, soft, nontender Ext: warm, no edema, no clubbing, no cyanosis Derm: no rash or skin breakdown Neuro: A&Ox4, MAEW      Assessment & Plan:   Chronic cough Laura Underwood's cough continues to be a problem.  Today her lungs are clear and her CXR last year was normal.    I explained to her that I believe that her cough is primarily coming from vocal cord irritation, but given the improvement with Ventolin and the positive methacholine challenge test there may be an airways component. She did not respond to inhaled steroids.  Plan: -Trial of Breo with the long-acting bronchodilator -Continue to take care of her vocal cords, educated on minimizing talking on days when she is coughing, minimizing clearing her throat, and keeping water nearby at all times -Tramadol as needed for cough -If no improvement in 6 weeks then we will start Neurontin again -Follow-up 6 weeks    Updated Medication List Outpatient Encounter Prescriptions as of 06/18/2014  Medication Sig  . albuterol (PROVENTIL HFA;VENTOLIN HFA) 108 (90 BASE) MCG/ACT inhaler Inhale 2 puffs into the lungs every 4 (four) hours as needed for wheezing  or shortness of breath.  Marland Kitchen aspirin 81 MG tablet Take 81 mg by mouth daily.  . cetirizine (ZYRTEC) 10 MG tablet Take 10 mg by mouth daily.  . Cholecalciferol (VITAMIN D PO) Take by mouth daily.  Marland Kitchen docusate sodium (COLACE) 100 MG capsule Take 100 mg by mouth 2 (two) times daily.  . folic acid (FOLVITE) 503 MCG tablet Take 400 mcg by mouth daily.  . hydroxypropyl methylcellulose / hypromellose (ISOPTO TEARS / GONIOVISC) 2.5 % ophthalmic solution Place 1 drop into both eyes as needed for dry eyes.  . Misc Natural Products (TART CHERRY ADVANCED PO) Take 1,200 mg by mouth daily.  . Multiple Vitamin (MULTIVITAMIN) tablet Take 1 tablet by mouth daily.  . Omega-3 Fatty Acids (FISH OIL) 1200 MG CAPS Take by mouth.  . Spacer/Aero-Holding Chambers (E-Z  SPACER) inhaler Use as instructed  . [DISCONTINUED] beclomethasone (QVAR) 80 MCG/ACT inhaler Inhale 2 puffs into the lungs 2 (two) times daily.  . traMADol (ULTRAM) 50 MG tablet Take 1 tablet (50 mg total) by mouth every 6 (six) hours as needed.

## 2014-06-18 NOTE — Assessment & Plan Note (Signed)
Laura Underwood's cough continues to be a problem.  Today her lungs are clear and her CXR last year was normal.    I explained to her that I believe that her cough is primarily coming from vocal cord irritation, but given the improvement with Ventolin and the positive methacholine challenge test there may be an airways component. She did not respond to inhaled steroids.  Plan: -Trial of Breo with the long-acting bronchodilator -Continue to take care of her vocal cords, educated on minimizing talking on days when she is coughing, minimizing clearing her throat, and keeping water nearby at all times -Tramadol as needed for cough -If no improvement in 6 weeks then we will start Neurontin again -Follow-up 6 weeks

## 2014-07-08 ENCOUNTER — Encounter: Payer: Self-pay | Admitting: Pulmonary Disease

## 2014-07-08 MED ORDER — FLUTICASONE FUROATE-VILANTEROL 100-25 MCG/INH IN AEPB: 1.0000 | INHALATION_SPRAY | Freq: Every day | RESPIRATORY_TRACT | Status: DC

## 2014-07-11 LAB — HM DIABETES FOOT EXAM

## 2014-07-18 ENCOUNTER — Encounter: Payer: Self-pay | Admitting: Pulmonary Disease

## 2014-07-18 ENCOUNTER — Ambulatory Visit (INDEPENDENT_AMBULATORY_CARE_PROVIDER_SITE_OTHER): Payer: BLUE CROSS/BLUE SHIELD | Admitting: Pulmonary Disease

## 2014-07-18 VITALS — BP 142/84 | HR 72 | Ht 65.0 in | Wt 181.0 lb

## 2014-07-18 DIAGNOSIS — R05 Cough: Secondary | ICD-10-CM

## 2014-07-18 DIAGNOSIS — R053 Chronic cough: Secondary | ICD-10-CM

## 2014-07-18 MED ORDER — GABAPENTIN 100 MG PO CAPS: ORAL_CAPSULE | ORAL | Status: DC

## 2014-07-18 NOTE — Patient Instructions (Signed)
Keep taking the Breo as you are doing Start taking the Gabapentin 100mg  three times a day for one week, then 200mg  three times a day for one week, then 300mg  three times a day  Keep following the "sip and zip" strategy  We will see you back in 6-8 weeks or sooner if needed

## 2014-07-18 NOTE — Assessment & Plan Note (Signed)
She says that the cough has improved slightly while taking no, but she continues to have cough interfering with her quality of life and her job. She has some degree of asthma, some degree of acid reflux and some degree of postnasal drip. But primarily she has problems with an irritable larynx.  Plan: -Continue Breo -Because of ongoing symptoms of cough we are going to start gabapentin and titrate up to 300 mg 3 times a day -Follow-up 6 weeks

## 2014-07-18 NOTE — Progress Notes (Signed)
Subjective:    Patient ID: Laura Underwood, female    DOB: 08-10-1950, 64 y.o.   MRN: [id number]  Synopsis: Laura Underwood reestablish care with [facility] in 2015 for chronic cough with upper airway cough syndrome, positive methacholine challenge test, and possible acid reflux. Previously treated with gabapentin and Elavil for laryngeal sensitivity leading to cough. She had a normal simple spirometry test in 2012.  She has had a history of a negative esophageal pH probe, chest x-ray in June 2015 was normal.  HPI  Chief Complaint  Patient presents with  . Follow-up    Pt states the breo is helping her coughing.  Still had nonprod cough, using tramadol about 2X daily, rarely uses rescue inhaler.     Laura Underwood says that she has been using the tramadol twice per day and she thinks that there Memory [employer] is helping.  She still has cough in the mornings and the long coughing spells have improved.  She has been doing her "sip and zip" strategy of not talking and drinking water which helps.  She says ice water helps.  Talking on the telephone still bothers her a lot.  She denies mucus production or wheezing or dyspnea.    Past Medical History  Diagnosis Date  . Diverticulosis   . Cough   . Shortness of breath   . Colon polyp   . Irritable bowel disease   . Abdominal pain, other specified site   . Hemorrhoids   . Stress   . Dizziness   . Headache(784.0)   . Wears glasses   . Arthritis   . Menopause   . GERD (gastroesophageal reflux disease)   . Chronic cough     s/p evaluation by allergist and pulmonologist   . Hiatal hernia   . Goiter      Review of Systems     Objective:   Physical Exam Filed Vitals:   07/18/14 0907  BP: 142/84  Pulse: 72  Height: 5\' 5"  (1.651 m)  Weight: 181 lb (82.101 kg)  SpO2: 99%    Gen: well appearing, no acute distress HEENT: NCAT, EOMi, OP clear PULM: CTA B CV: RRR, no mgr, no JVD AB: BS+, soft, nontender Ext: warm, no edema, no clubbing, no  cyanosis Derm: no rash or skin breakdown Neuro: A&Ox4, MAEW      Assessment & Plan:   Chronic cough She says that the cough has improved slightly while taking no, but she continues to have cough interfering with her quality of life and her job. She has some degree of asthma, some degree of acid reflux and some degree of postnasal drip. But primarily she has problems with an irritable larynx.  Plan: -Continue Breo -Because of ongoing symptoms of cough we are going to start gabapentin and titrate up to 300 mg 3 times a day -Follow-up 6 weeks     Updated Medication List Outpatient Encounter Prescriptions as of 07/18/2014  Medication Sig  . albuterol (PROVENTIL HFA;VENTOLIN HFA) 108 (90 BASE) MCG/ACT inhaler Inhale 2 puffs into the lungs every 4 (four) hours as needed for wheezing or shortness of breath.  Marland Kitchen aspirin 81 MG tablet Take 81 mg by mouth daily.  . cetirizine (ZYRTEC) 10 MG tablet Take 10 mg by mouth daily.  . Cholecalciferol (VITAMIN D PO) Take by mouth daily.  Marland Kitchen docusate sodium (COLACE) 100 MG capsule Take 100 mg by mouth 2 (two) times daily.  . Fluticasone Furoate-Vilanterol (BREO ELLIPTA) 100-25 MCG/INH AEPB Inhale 1 puff  into the lungs daily.  . folic acid (FOLVITE) 681 MCG tablet Take 400 mcg by mouth daily.  . hydroxypropyl methylcellulose / hypromellose (ISOPTO TEARS / GONIOVISC) 2.5 % ophthalmic solution Place 1 drop into both eyes as needed for dry eyes.  . Misc Natural Products (TART CHERRY ADVANCED PO) Take 1,200 mg by mouth daily.  . Multiple Vitamin (MULTIVITAMIN) tablet Take 1 tablet by mouth daily.  . Omega-3 Fatty Acids (FISH OIL) 1200 MG CAPS Take by mouth.  . Spacer/Aero-Holding Chambers (E-Z SPACER) inhaler Use as instructed  . traMADol (ULTRAM) 50 MG tablet Take 1 tablet (50 mg total) by mouth every 6 (six) hours as needed.  . gabapentin (NEURONTIN) 100 MG capsule Take 100mg  po tid x7 days, then 200mg  po tid x7 days, then 300mg  po tid

## 2014-08-08 ENCOUNTER — Emergency Department: Payer: Self-pay | Admitting: Emergency Medicine

## 2014-08-08 IMAGING — US ABDOMEN ULTRASOUND LIMITED
1 series · 14 of 25 positions shown · non-contrast
Comparison: None.

CLINICAL DATA: Epigastric pain

EXAM:
US ABDOMEN LIMITED - RIGHT UPPER QUADRANT

[Series 1: abdomen ultrasound limited · 0.20mm/px · 14 of 59 slices shown]
[im 1/59]
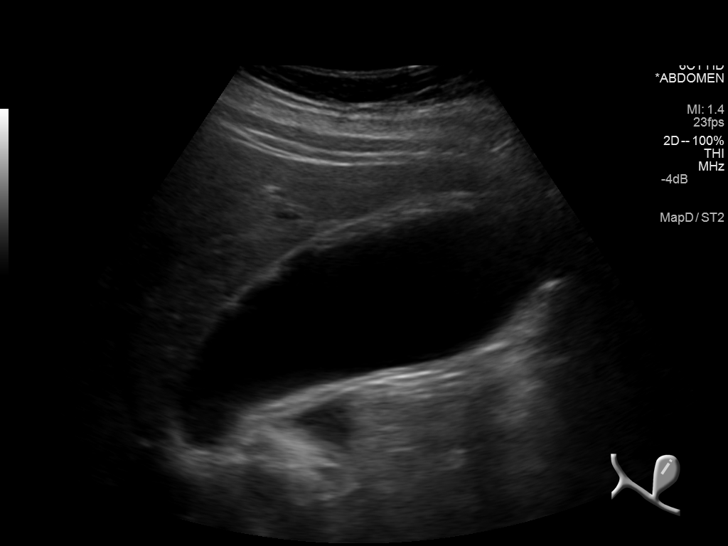
[im 5/59]
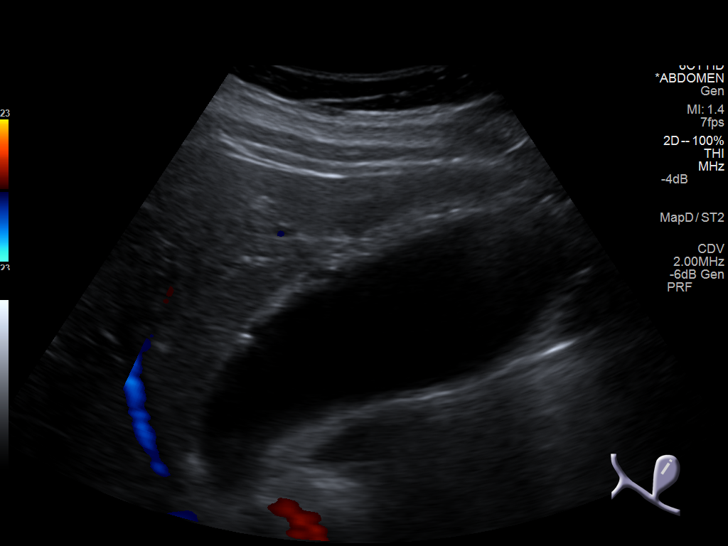
[im 10/59]
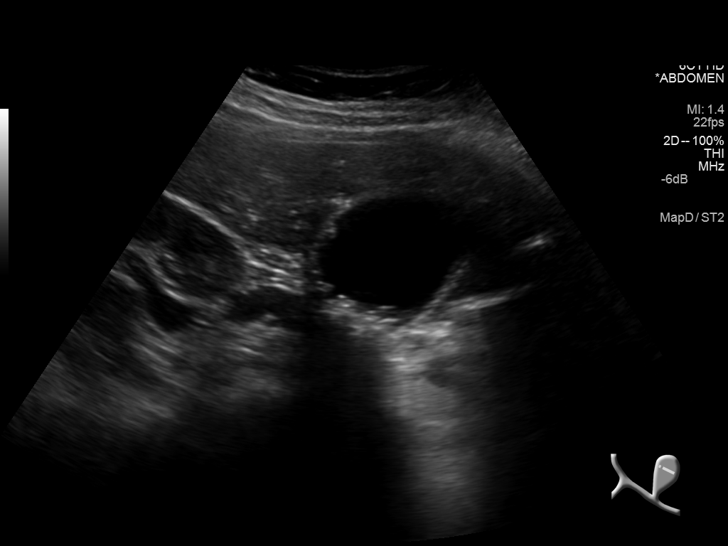
[im 15/59]
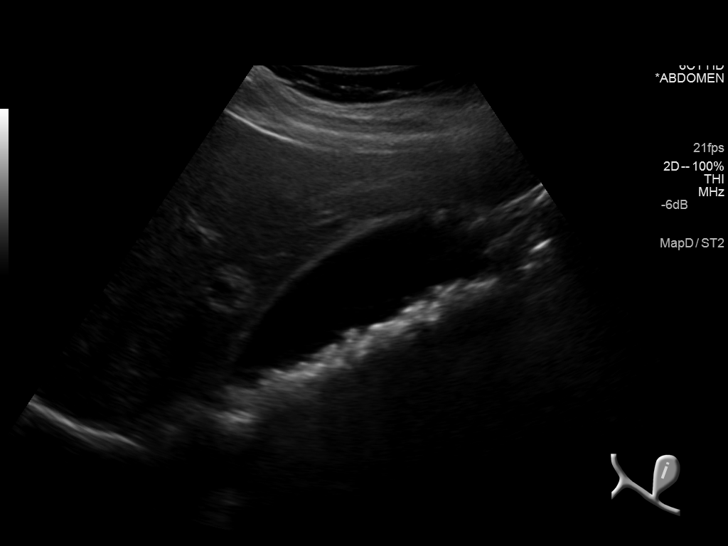
[im 20/59]
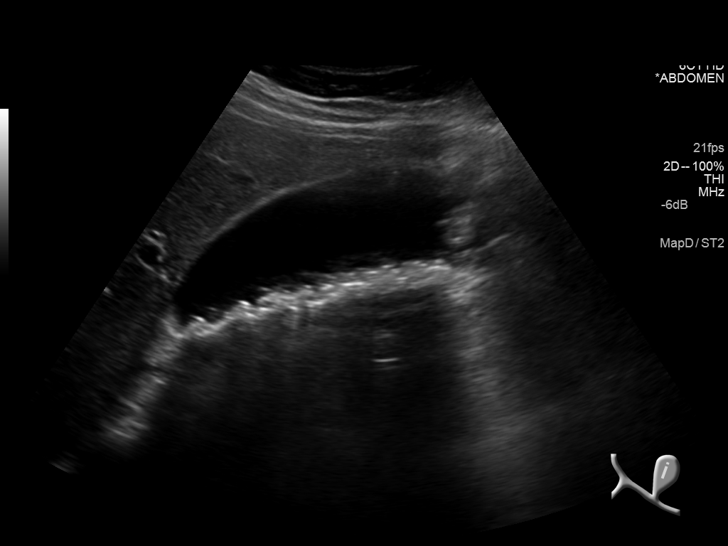
[im 22/59]
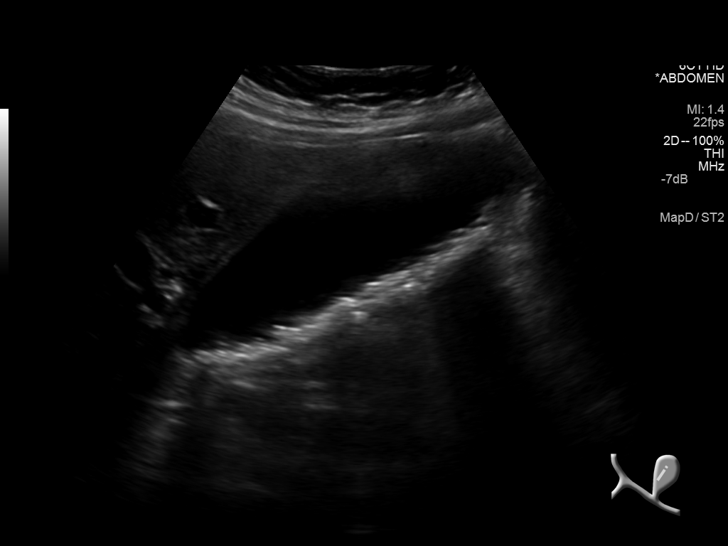
[im 27/59]
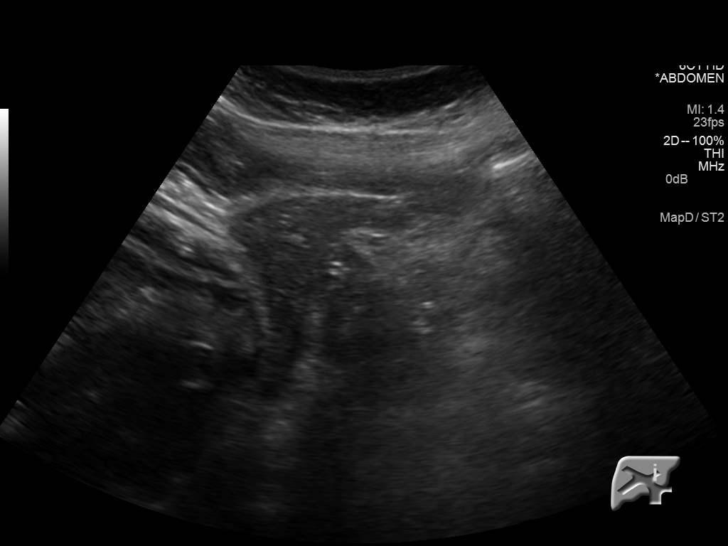
[im 32/59]
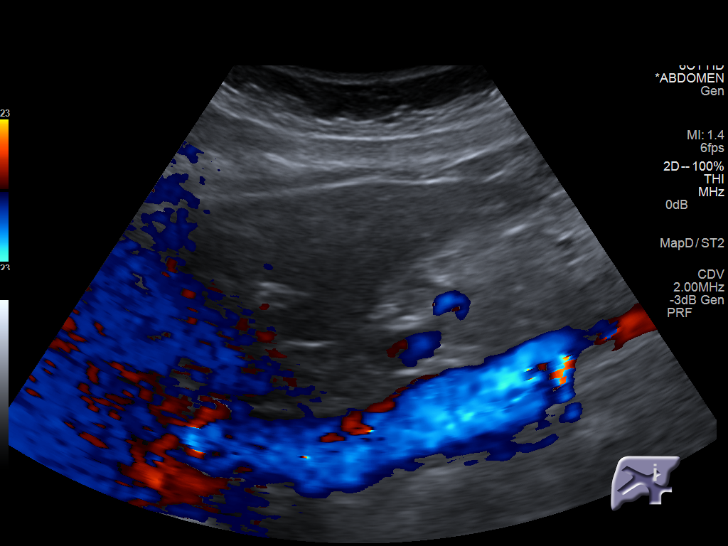
[im 37/59]
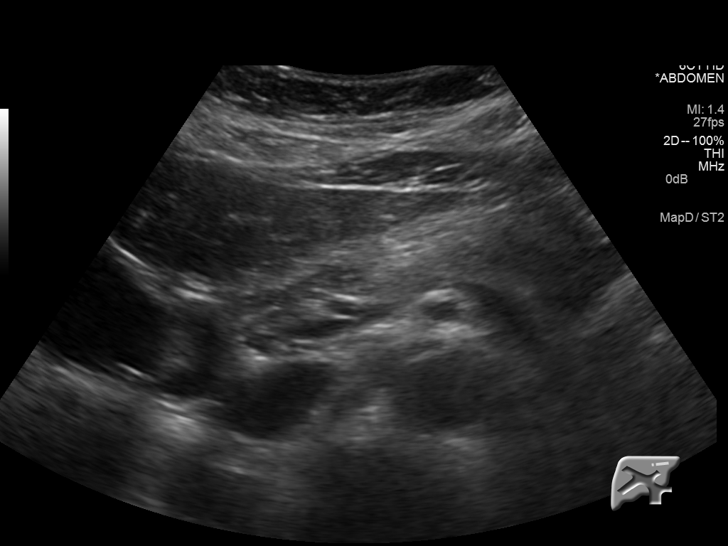
[im 39/59]
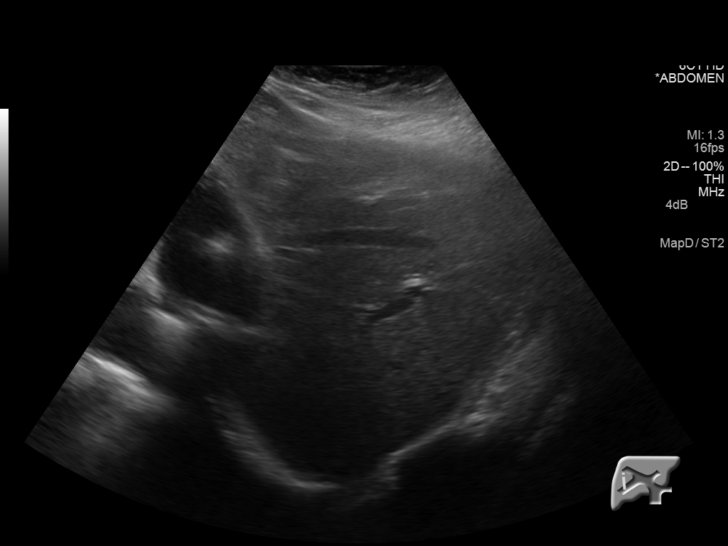
[im 44/59]
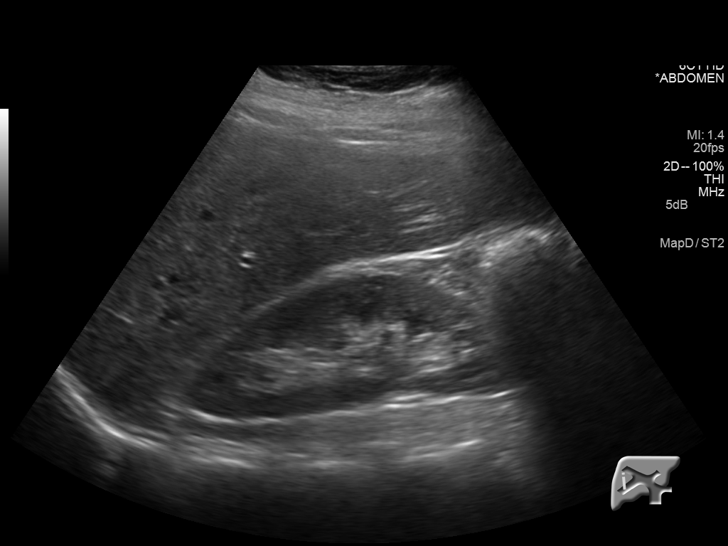
[im 49/59]
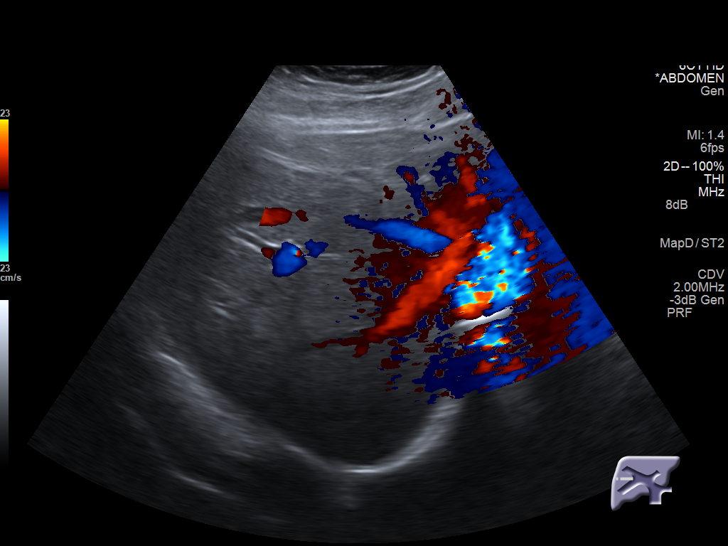
[im 54/59]
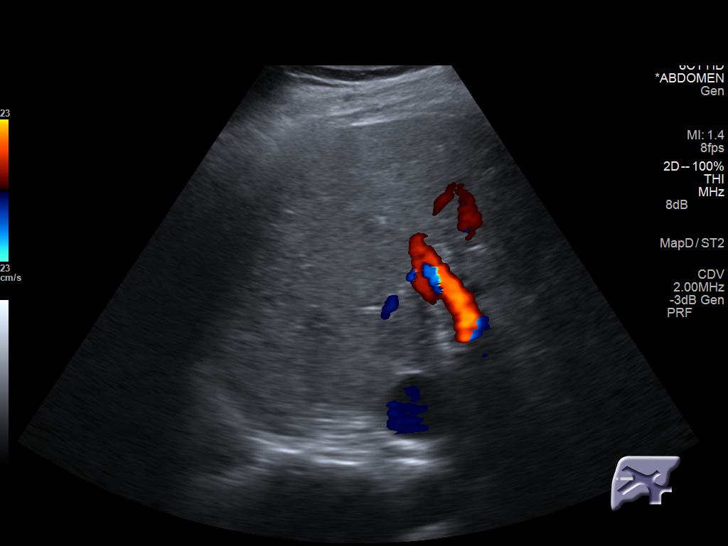
[im 59/59]
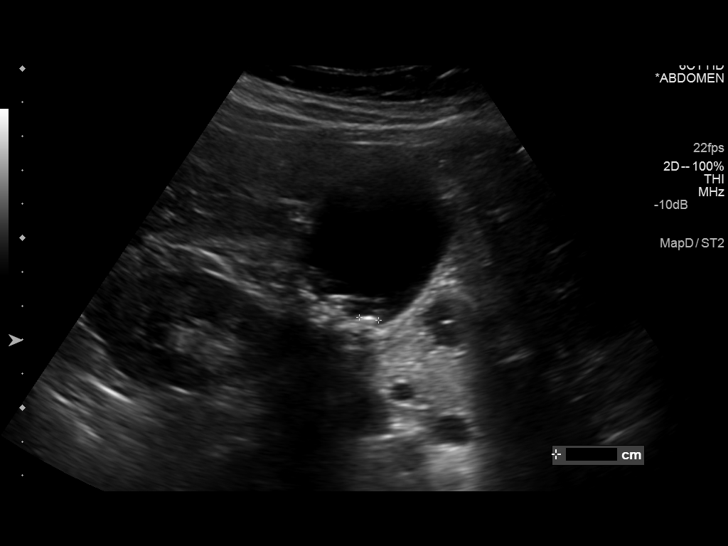

[14 of 25 positions shown; findings below may reference images not displayed]

FINDINGS: Gallbladder:

Multiple mobile and nonmobile gallstones are noted within
gallbladder the largest measures 9 mm. No thickening of gallbladder
wall. No sonographic Murphy's sign.

Common bile duct:

Diameter: 5.3 mm in diameter within normal limits.

Liver:

No focal lesion identified. Within normal limits in parenchymal
echogenicity.
IMPRESSION: Multiple mobile and nonmobile gallstones are noted within
gallbladder. The largest measures 9 mm. No sonographic Murphy's
sign. Normal CBD.

## 2014-08-09 ENCOUNTER — Other Ambulatory Visit: Payer: Self-pay | Admitting: Internal Medicine

## 2014-08-09 ENCOUNTER — Encounter: Payer: Self-pay | Admitting: Internal Medicine

## 2014-08-09 ENCOUNTER — Encounter: Payer: Self-pay | Admitting: *Deleted

## 2014-08-09 DIAGNOSIS — K8020 Calculus of gallbladder without cholecystitis without obstruction: Secondary | ICD-10-CM

## 2014-08-09 NOTE — Telephone Encounter (Signed)
Patient called to confirm appointment, she changed appointment date. 08-28-14 @ 11:00

## 2014-08-22 ENCOUNTER — Ambulatory Visit: Payer: Self-pay | Admitting: General Surgery

## 2014-08-23 ENCOUNTER — Ambulatory Visit: Payer: BC Managed Care – PPO | Admitting: Internal Medicine

## 2014-08-28 ENCOUNTER — Encounter: Payer: Self-pay | Admitting: General Surgery

## 2014-08-28 ENCOUNTER — Other Ambulatory Visit: Payer: Self-pay | Admitting: General Surgery

## 2014-08-28 ENCOUNTER — Ambulatory Visit (INDEPENDENT_AMBULATORY_CARE_PROVIDER_SITE_OTHER): Payer: BLUE CROSS/BLUE SHIELD | Admitting: General Surgery

## 2014-08-28 VITALS — BP 130/76 | HR 76 | Resp 12 | Ht 65.0 in | Wt 178.0 lb

## 2014-08-28 DIAGNOSIS — K8020 Calculus of gallbladder without cholecystitis without obstruction: Secondary | ICD-10-CM

## 2014-08-28 NOTE — Patient Instructions (Addendum)

## 2014-08-28 NOTE — [facility] Notes (Signed)
Patient ID: Laura Underwood, female   DOB: 14-May-1951, 64 y.o.   MRN: [id number]  Chief Complaint  Patient presents with  . Other    gallstones    HPI Laura Underwood is a 64 y.o. female here today for a evaluation of gallstones. She was seen in [facility] ER two weeks ago. She had a ultrasound done at the time of the ER visit. She reported pain across the mid abdomen and up into her left arm which lead her to the ER. She has had mild symptoms since then. She has a history of diverticulosis.  The patient is accompanied by her husband, Zada Girt, M.D. he was a Neurosurgeon in [place], now managing a wound care center in [place], [place]. HPI  Past Medical History  Diagnosis Date  . Diverticulosis   . Cough   . Shortness of breath   . Colon polyp   . Irritable bowel disease   . Abdominal pain, other specified site   . Hemorrhoids   . Stress   . Dizziness   . Headache(784.0)   . Wears glasses   . Arthritis   . Menopause   . GERD (gastroesophageal reflux disease)   . Chronic cough     s/p evaluation by allergist and pulmonologist   . Hiatal hernia   . Goiter   . Diabetes mellitus without complication   . Asthma     Dr Lake Bells    Past Surgical History  Procedure Laterality Date  . Cesarean section  [phone]  . Cesarean section  [phone]  . Ovarin cyst  2004    ovary removed  . Jaw abscess  jan 2012  . Laparoscopy  1981  . Esophagogastroduodenoscopy N/A 08/15/2013    Procedure: ESOPHAGOGASTRODUODENOSCOPY (EGD);  Surgeon: Lear Ng, MD;  Location: [facility];  Service: Endoscopy;  Laterality: N/A;  . Bravo ph study N/A 08/15/2013    Procedure: BRAVO [place];  Surgeon: Lear Ng, MD;  Location: [facility];  Service: Endoscopy;  Laterality: N/A;  . Colonoscopy N/A 08/15/2013    Procedure: COLONOSCOPY;  Surgeon: Lear Ng, MD;  Location: [facility];  Service: Endoscopy;  Laterality: N/A;    Family History  Problem Relation Age of  Onset  . Stroke Mother   . Hypertension Mother   . Hyperlipidemia Mother   . Depression Mother   . Hypertension Sister   . Prostate cancer Father   . Liver cancer Father     metastatic liver  . Breast cancer Maternal Aunt   . Depression Maternal Aunt   . Depression Maternal Grandfather   . Depression Cousin   . Pancreatic cancer Paternal Aunt   . Pancreatic cancer Paternal Grandmother     Social History History  Substance Use Topics  . Smoking status: Never Smoker   . Smokeless tobacco: Never Used  . Alcohol Use: 0.0 oz/week    1-2 Cans of beer per week    Allergies  Allergen Reactions  . Latex   . Nickel   . Neosporin [Neomycin-Bacitracin Zn-Polymyx] Rash    Current Outpatient Prescriptions  Medication Sig Dispense Refill  . albuterol (PROVENTIL HFA;VENTOLIN HFA) 108 (90 BASE) MCG/ACT inhaler Inhale 2 puffs into the lungs every 4 (four) hours as needed for wheezing or shortness of breath. 1 Inhaler 6  . aspirin 81 MG tablet Take 81 mg by mouth daily.    . cetirizine (ZYRTEC) 10 MG tablet Take 10 mg by mouth daily.    Marland Kitchen  Cholecalciferol (VITAMIN D PO) Take by mouth daily.    Marland Kitchen docusate sodium (COLACE) 100 MG capsule Take 100 mg by mouth 2 (two) times daily.    . Fluticasone Furoate-Vilanterol (BREO ELLIPTA) 100-25 MCG/INH AEPB Inhale 1 puff into the lungs daily. 60 each 5  . folic acid (FOLVITE) 301 MCG tablet Take 400 mcg by mouth daily.    Marland Kitchen gabapentin (NEURONTIN) 100 MG capsule Take 100mg  po tid x7 days, then 200mg  po tid x7 days, then 300mg  po tid 270 capsule 2  . hydroxypropyl methylcellulose / hypromellose (ISOPTO TEARS / GONIOVISC) 2.5 % ophthalmic solution Place 1 drop into both eyes as needed for dry eyes.    . Misc Natural Products (TART CHERRY ADVANCED PO) Take 1,200 mg by mouth daily.    . Multiple Vitamin (MULTIVITAMIN) tablet Take 1 tablet by mouth daily.    . Omega-3 Fatty Acids (FISH OIL) 1200 MG CAPS Take by mouth.    . Spacer/Aero-Holding Chambers (E-Z  SPACER) inhaler Use as instructed 1 each 2  . traMADol (ULTRAM) 50 MG tablet Take 1 tablet (50 mg total) by mouth every 6 (six) hours as needed. 40 tablet 0   No current facility-administered medications for this visit.    Review of Systems Review of Systems  Constitutional: Negative.   Respiratory: Negative.   Cardiovascular: Negative.   Gastrointestinal: Positive for vomiting.    Blood pressure 130/76, pulse 76, resp. rate 12, height 5\' 5"  (1.651 m), weight 178 lb (80.74 kg).  Physical Exam Physical Exam  Constitutional: She is oriented to person, place, and time. She appears well-developed and well-nourished.  Eyes: Conjunctivae are normal. No scleral icterus.  Neck: Neck supple.  Cardiovascular: Normal rate, regular rhythm and normal heart sounds.   Pulmonary/Chest: Effort normal and breath sounds normal.  Abdominal: Soft. Bowel sounds are normal. There is tenderness in the right upper quadrant.  Lymphadenopathy:    She has no cervical adenopathy.  Neurological: She is alert and oriented to person, place, and time.  Skin: Skin is dry.    Data Reviewed Abdominal ultrasound dated 08/08/2014 showed mobile and nonmobile gallstones within the gallbladder. No sonographic Murphy sign. No gallbladder wall thickness noted. Common bile duct 5.3 mm.  Laboratory studies from her emergency department visit were reviewed. Troponin normal at less than 0.02. Normal ECG. White blood cell count of 9100 with 86% polys. Hemoglobin 11.9 (minimal change from December 2015 at 12.3) sees with a normal MCV of 83. Random blood sugar 105. Normal electrolytes, creatinine 0.71. Estimated GFR normal greater than 60. Normal liver function studies. Lipase normal at 274 (C 73-393. Urinalysis negative.  Assessment    Symptomatically cholelithiasis.    Plan    The patient has had intermittent episodes of upper abdominal pain over the past year, most pronounced 2 weeks ago and in a minor was performed 2  days ago. She had in the past attributed this to diverticulosis. Considering the location, this is most likely related to the biliary tract.  Pros and cons of elective cholecystectomy were reviewed. The risks associated with the procedure including those of bleeding, infection and bile duct injury were discussed. The possibility of an open procedure was reviewed.  Patient's surgery has been scheduled for 09-13-14 at [facility]. It is okay for patient to continue 81 mg aspirin once daily.     PCP:  Sonnie Alamo 08/28/2014, 9:24 PM

## 2014-08-29 ENCOUNTER — Encounter: Payer: Self-pay | Admitting: General Surgery

## 2014-08-30 ENCOUNTER — Ambulatory Visit (INDEPENDENT_AMBULATORY_CARE_PROVIDER_SITE_OTHER): Payer: BLUE CROSS/BLUE SHIELD | Admitting: Internal Medicine

## 2014-08-30 ENCOUNTER — Encounter: Payer: Self-pay | Admitting: Internal Medicine

## 2014-08-30 VITALS — BP 106/67 | HR 67 | Temp 98.0°F | Ht 65.0 in | Wt 174.0 lb

## 2014-08-30 DIAGNOSIS — R05 Cough: Secondary | ICD-10-CM

## 2014-08-30 DIAGNOSIS — K8020 Calculus of gallbladder without cholecystitis without obstruction: Secondary | ICD-10-CM | POA: Diagnosis not present

## 2014-08-30 DIAGNOSIS — E119 Type 2 diabetes mellitus without complications: Secondary | ICD-10-CM | POA: Diagnosis not present

## 2014-08-30 DIAGNOSIS — R053 Chronic cough: Secondary | ICD-10-CM

## 2014-08-30 LAB — LIPID PANEL
Cholesterol: 197 mg/dL (ref 0–200)
HDL: 41.8 mg/dL (ref >39.00)
LDL Cholesterol: 144 mg/dL — ABNORMAL HIGH (ref 0–99)
NONHDL: 155.2
Total CHOL/HDL Ratio: 5
Triglycerides: 57 mg/dL (ref 0.0–149.0)
VLDL: 11.4 mg/dL (ref 0.0–40.0)

## 2014-08-30 LAB — MICROALBUMIN / CREATININE URINE RATIO
CREATININE, U: 52.4 mg/dL
Microalb Creat Ratio: 1.3 mg/g (ref 0.0–30.0)
Microalb, Ur: <0.7 mg/dL (ref 0.0–1.9)

## 2014-08-30 LAB — COMPREHENSIVE METABOLIC PANEL
ALBUMIN: 4.3 g/dL (ref 3.5–5.2)
ALK PHOS: 86 U/L (ref 39–117)
ALT: 368 U/L — AB (ref 0–35)
AST: 143 U/L — ABNORMAL HIGH (ref 0–37)
BUN: 8 mg/dL (ref 6–23)
CALCIUM: 9.5 mg/dL (ref 8.4–10.5)
CO2: 31 mEq/L (ref 19–32)
CREATININE: 0.72 mg/dL (ref 0.40–1.20)
Chloride: 101 mEq/L (ref 96–112)
GFR: 105.32 mL/min (ref >60.00)
Glucose, Bld: 90 mg/dL (ref 70–99)
POTASSIUM: 4.3 meq/L (ref 3.5–5.1)
SODIUM: 137 meq/L (ref 135–145)
Total Bilirubin: 1.3 mg/dL — ABNORMAL HIGH (ref 0.2–1.2)
Total Protein: 7.4 g/dL (ref 6.0–8.3)

## 2014-08-30 LAB — HEMOGLOBIN A1C: HEMOGLOBIN A1C: 6.2 % (ref 4.6–6.5)

## 2014-08-30 NOTE — Assessment & Plan Note (Signed)
Will check A1c with labs. Continue healthy diet for control of BG.

## 2014-08-30 NOTE — Progress Notes (Signed)
Pre visit review using our clinic review tool, if applicable. No additional management support is needed unless otherwise documented below in the visit note. 

## 2014-08-30 NOTE — Assessment & Plan Note (Signed)
Symptoms of chronic cough improved with use of neurontin. Follow up with Dr. Lake Bells pending. Will follow.

## 2014-08-30 NOTE — Patient Instructions (Signed)
Try to limit carbohydrates to 35gm with each meal and 15gm with a snack.  Labs today.  Follow up in 3 months.

## 2014-08-30 NOTE — [facility] Notes (Signed)
   Subjective:    Patient ID: Laura Underwood, female    DOB: 12/23/1950, 64 y.o.   MRN: [id number]  HP 64YO female presents for follow up.  Scheduled for cholecystectomy 4/1. Having some occasional pain in upper abdomen if eating fatty foods.  DM - BG mostly 90s-130s. Compliant with healthy diet  Cough improved with use of Neurontin. Has follow up scheduled with Dr. Lake Bells.  Past medical, surgical, family and social history per today's encounter.  Review of Systems  Constitutional: Negative for fever, chills, appetite change, fatigue and unexpected weight change.  Eyes: Negative for visual disturbance.  Respiratory: Positive for cough. Negative for chest tightness and shortness of breath.   Cardiovascular: Negative for chest pain and leg swelling.  Gastrointestinal: Negative for nausea, vomiting, abdominal pain, diarrhea and constipation.  Musculoskeletal: Negative for myalgias and arthralgias.  Skin: Negative for color change and rash.  Hematological: Negative for adenopathy. Does not bruise/bleed easily.  Psychiatric/Behavioral: Negative for sleep disturbance and dysphoric mood. The patient is not nervous/anxious.        Objective:    BP 106/67 mmHg  Pulse 67  Temp(Src) 98 F (36.7 C) (Oral)  Ht 5\' 5"  (1.651 m)  Wt 174 lb (78.926 kg)  BMI 28.96 kg/m2  SpO2 100% Physical Exam  Constitutional: She is oriented to person, place, and time. She appears well-developed and well-nourished. No distress.  HENT:  Head: Normocephalic and atraumatic.  Right Ear: External ear normal.  Left Ear: External ear normal.  Nose: Nose normal.  Mouth/Throat: Oropharynx is clear and moist. No oropharyngeal exudate.  Eyes: Conjunctivae are normal. Pupils are equal, round, and reactive to light. Right eye exhibits no discharge. Left eye exhibits no discharge. No scleral icterus.  Neck: Normal range of motion. Neck supple. No tracheal deviation present. No thyromegaly present.    Cardiovascular: Normal rate, regular rhythm, normal heart sounds and intact distal pulses.  Exam reveals no gallop and no friction rub.   No murmur heard. Pulmonary/Chest: Effort normal and breath sounds normal. No respiratory distress. She has no wheezes. She has no rales. She exhibits no tenderness.  Musculoskeletal: Normal range of motion. She exhibits no edema or tenderness.  Lymphadenopathy:    She has no cervical adenopathy.  Neurological: She is alert and oriented to person, place, and time. No cranial nerve deficit. She exhibits normal muscle tone. Coordination normal.  Skin: Skin is warm and dry. No rash noted. She is not diaphoretic. No erythema. No pallor.  Psychiatric: She has a normal mood and affect. Her behavior is normal. Judgment and thought content normal.          Assessment & Plan:   Problem List Items Addressed This Visit      Unprioritized   Chronic cough    Symptoms of chronic cough improved with use of neurontin. Follow up with Dr. Lake Bells pending. Will follow.      Diabetes mellitus type 2, controlled - Primary    Will check A1c with labs. Continue healthy diet for control of BG.       Relevant Orders   Comprehensive metabolic panel   Hemoglobin A1c   Microalbumin / creatinine urine ratio   Lipid panel   Gallstones    Scheduled for cholecystectomy on 4/1. Symptoms of mild upper abdominal pain relatively well controlled at present.          Return in about 3 months (around 11/30/2014) for Recheck of Diabetes.

## 2014-08-30 NOTE — Assessment & Plan Note (Signed)
Scheduled for cholecystectomy on 4/1. Symptoms of mild upper abdominal pain relatively well controlled at present.

## 2014-09-03 ENCOUNTER — Ambulatory Visit (INDEPENDENT_AMBULATORY_CARE_PROVIDER_SITE_OTHER): Payer: BLUE CROSS/BLUE SHIELD | Admitting: Pulmonary Disease

## 2014-09-03 ENCOUNTER — Encounter: Payer: Self-pay | Admitting: Pulmonary Disease

## 2014-09-03 VITALS — BP 128/76 | HR 66 | Ht 65.0 in | Wt 179.0 lb

## 2014-09-03 DIAGNOSIS — R05 Cough: Secondary | ICD-10-CM

## 2014-09-03 DIAGNOSIS — R053 Chronic cough: Secondary | ICD-10-CM

## 2014-09-03 MED ORDER — GABAPENTIN 100 MG PO CAPS: ORAL_CAPSULE | ORAL | Status: DC

## 2014-09-03 NOTE — Assessment & Plan Note (Signed)
We have seen a slight improvement in her cough. As noted previously she may have some very mild asthma but she does not have evidence of it today. Her primary problem is ongoing laryngeal irritation. This has improved significantly with gabapentin. I am okay with her taking as much as 1500 mg a day of gabapentin if needed.  Plan: -Increase gabapentin slowly over 2 weeks to 1500 mg a day (500 mg 3 times a day) -Follow-up 3 months -

## 2014-09-03 NOTE — Patient Instructions (Signed)
Good luck with surgery  If you are still having trouble with cough about 3-4 weeks after surgery, increase the dose of the gabapentin to 400mg  three times a day for a week, if no better, then increase to 500mg  three times a day.  Keep "sipping and zipping", but don't do too much sitting!  We will see you back in three months or sooner if needed

## 2014-09-03 NOTE — [facility] Notes (Signed)
Subjective:    Patient ID: Laura Underwood, female    DOB: 18-Jul-1950, 64 y.o.   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 Previously treated with gabapentin and Elavil for laryngeal sensitivity leading to cough. She had a normal simple spirometry test in 2012.  She has had a history of a negative esophageal pH probe, chest x-ray in June 2015 was normal.  HPI  Chief Complaint  Patient presents with  . Follow-up    Pt having cholecystectomy on 4/1.  Pt c/o nonprod cough, states she is coughing less and with less intensity.     Laura Underwood says that she is still coughing some but is better than before.  It is still dry but genearlly much better than before.  She still has a morning cough, but not nearly as bad as before.   Past Medical History  Diagnosis Date  . Diverticulosis   . Cough   . Shortness of breath   . Colon polyp   . Irritable bowel disease   . Abdominal pain, other specified site   . Hemorrhoids   . Stress   . Dizziness   . Headache(784.0)   . Wears glasses   . Arthritis   . Menopause   . GERD (gastroesophageal reflux disease)   . Chronic cough     s/p evaluation by allergist and pulmonologist   . Hiatal hernia   . Goiter   . Diabetes mellitus without complication   . Asthma     Dr Lake Bells     Review of Systems     Objective:   Physical Exam Filed Vitals:   09/03/14 0944  BP: 128/76  Pulse: 66  Height: 5\' 5"  (1.651 m)  Weight: 179 lb (81.194 kg)  SpO2: 100%    Gen: well appearing, no acute distress HEENT: NCAT, EOMi, OP clear PULM: CTA B CV: RRR, no mgr, no JVD AB: BS+, soft, nontender Ext: warm, no edema, no clubbing, no cyanosis Derm: no rash or skin breakdown Neuro: A&Ox4, MAEW      Assessment & Plan:   Chronic cough We have seen a slight improvement in her cough. As noted  previously she may have some very mild asthma but she does not have evidence of it today. Her primary problem is ongoing laryngeal irritation. This has improved significantly with gabapentin. I am okay with her taking as much as 1500 mg a day of gabapentin if needed.  Plan: -Increase gabapentin slowly over 2 weeks to 1500 mg a day (500 mg 3 times a day) -Follow-up 3 months -     Updated Medication List Outpatient Encounter Prescriptions as of 09/03/2014  Medication Sig  . albuterol (PROVENTIL HFA;VENTOLIN HFA) 108 (90 BASE) MCG/ACT inhaler Inhale 2 puffs into the lungs every 4 (four) hours as needed for wheezing or shortness of breath.  Marland Kitchen aspirin 81 MG tablet Take 81 mg by mouth daily.  . cetirizine (ZYRTEC) 10 MG tablet Take 10 mg by mouth daily.  . Cholecalciferol (VITAMIN D PO) Take by mouth daily.  Marland Kitchen docusate sodium (COLACE) 100 MG capsule Take 100 mg by mouth 2 (two) times daily.  . Fluticasone Furoate-Vilanterol (BREO ELLIPTA) 100-25 MCG/INH AEPB Inhale 1 puff into the lungs daily.  . folic acid (FOLVITE) 283 MCG tablet Take 400 mcg by mouth daily.  Marland Kitchen gabapentin (NEURONTIN) 100 MG capsule Take 400mg  tid  for x7 days, then increase to 500mg  po tid  . hydroxypropyl methylcellulose / hypromellose (ISOPTO TEARS / GONIOVISC) 2.5 % ophthalmic solution Place 1 drop into both eyes as needed for dry eyes.  . Misc Natural Products (TART CHERRY ADVANCED PO) Take 1,200 mg by mouth daily.  . Multiple Vitamin (MULTIVITAMIN) tablet Take 1 tablet by mouth daily.  . Omega-3 Fatty Acids (FISH OIL) 1200 MG CAPS Take by mouth.  . Spacer/Aero-Holding Chambers (E-Z SPACER) inhaler Use as instructed  . traMADol (ULTRAM) 50 MG tablet Take 1 tablet (50 mg total) by mouth every 6 (six) hours as needed.  . [DISCONTINUED] gabapentin (NEURONTIN) 100 MG capsule Take 100mg  po tid x7 days, then 200mg  po tid x7 days, then 300mg  po tid

## 2014-09-09 ENCOUNTER — Telehealth: Payer: Self-pay | Admitting: *Deleted

## 2014-09-09 DIAGNOSIS — R945 Abnormal results of liver function studies: Principal | ICD-10-CM

## 2014-09-09 DIAGNOSIS — R7989 Other specified abnormal findings of blood chemistry: Secondary | ICD-10-CM

## 2014-09-09 NOTE — Telephone Encounter (Signed)
Patient will try and stop by today to have labs drawn (Hepatic Function Panel).   This patient will come by the office and we will just need to walk patient to the lab.

## 2014-09-09 NOTE — Telephone Encounter (Signed)
-----   Message from Robert Bellow, MD sent at 09/09/2014 10:42 AM EDT ----- Please arrange for the patient to have a liver a panel drawn by no later than March 30 in preparation for planned surgery on April 1. Thank you ----- Message -----    From: Jackolyn Confer, MD    Sent: 08/30/2014   4:58 PM      To: Robert Bellow, MD  FYI - elevated LFTs, likely related to gallbladder. Scheduled for cholecystectomy 4/1 with you

## 2014-09-10 LAB — HEPATIC FUNCTION PANEL
ALT: 70 IU/L — ABNORMAL HIGH (ref 0–32)
AST: 26 IU/L (ref 0–40)
Albumin: 4.4 g/dL (ref 3.6–4.8)
Alkaline Phosphatase: 68 IU/L (ref 39–117)
BILIRUBIN TOTAL: 0.3 mg/dL (ref 0.0–1.2)
BILIRUBIN, DIRECT: 0.19 mg/dL (ref 0.00–0.40)
Total Protein: 6.9 g/dL (ref 6.0–8.5)

## 2014-09-13 ENCOUNTER — Encounter: Payer: Self-pay | Admitting: General Surgery

## 2014-09-13 ENCOUNTER — Ambulatory Visit
Admit: 2014-09-13 | Disposition: A | Discharge status: Home / Self Care | Payer: Self-pay | Attending: General Surgery | Admitting: General Surgery

## 2014-09-13 DIAGNOSIS — K8010 Calculus of gallbladder with chronic cholecystitis without obstruction: Secondary | ICD-10-CM | POA: Diagnosis not present

## 2014-09-13 HISTORY — PX: CHOLECYSTECTOMY: SHX55

## 2014-09-16 ENCOUNTER — Encounter: Payer: Self-pay | Admitting: General Surgery

## 2014-09-17 ENCOUNTER — Encounter: Payer: Self-pay | Admitting: General Surgery

## 2014-09-23 ENCOUNTER — Encounter: Payer: Self-pay | Admitting: General Surgery

## 2014-09-23 ENCOUNTER — Ambulatory Visit (INDEPENDENT_AMBULATORY_CARE_PROVIDER_SITE_OTHER): Payer: BLUE CROSS/BLUE SHIELD | Admitting: General Surgery

## 2014-09-23 VITALS — BP 122/78 | HR 78 | Resp 16 | Ht 65.0 in | Wt 172.0 lb

## 2014-09-23 DIAGNOSIS — K8020 Calculus of gallbladder without cholecystitis without obstruction: Secondary | ICD-10-CM

## 2014-09-23 NOTE — Patient Instructions (Addendum)
Patient to return as needed. Use proper lifting.

## 2014-09-23 NOTE — [facility] Notes (Signed)
Patient ID: Laura Underwood, female   DOB: 03-14-1951, 64 y.o.   MRN: [id number]  Chief Complaint  Patient presents with  . Routine Post Op    gallbladder removal    HPI Laura Underwood is a 64 y.o. female here for a post op from gallbladder removal. She is doing very well and has not had to take any pain medications.  HPI  Past Medical History  Diagnosis Date  . Diverticulosis   . Cough   . Shortness of breath   . Colon polyp   . Irritable bowel disease   . Abdominal pain, other specified site   . Hemorrhoids   . Stress   . Dizziness   . Headache(784.0)   . Wears glasses   . Arthritis   . Menopause   . GERD (gastroesophageal reflux disease)   . Chronic cough     s/p evaluation by allergist and pulmonologist   . Hiatal hernia   . Goiter   . Diabetes mellitus without complication   . Asthma     Dr Lake Bells    Past Surgical History  Procedure Laterality Date  . Cesarean section  [phone]  . Cesarean section  [phone]  . Ovarin cyst  2004    ovary removed  . Jaw abscess  jan 2012  . Laparoscopy  1981  . Esophagogastroduodenoscopy N/A 08/15/2013    Procedure: ESOPHAGOGASTRODUODENOSCOPY (EGD);  Surgeon: Lear Ng, MD;  Location: [facility];  Service: Endoscopy;  Laterality: N/A;  . Bravo ph study N/A 08/15/2013    Procedure: BRAVO [place];  Surgeon: Lear Ng, MD;  Location: [facility];  Service: Endoscopy;  Laterality: N/A;  . Colonoscopy N/A 08/15/2013    Procedure: COLONOSCOPY;  Surgeon: Lear Ng, MD;  Location: [facility];  Service: Endoscopy;  Laterality: N/A;  . Cholecystectomy  09/13/14    Family History  Problem Relation Age of Onset  . Stroke Mother   . Hypertension Mother   . Hyperlipidemia Mother   . Depression Mother   . Hypertension Sister   . Prostate cancer Father   . Liver cancer Father     metastatic liver  . Breast cancer Maternal Aunt   . Depression Maternal Aunt   . Depression Maternal Grandfather   .  Depression Cousin   . Pancreatic cancer Paternal Aunt   . Pancreatic cancer Paternal Grandmother     Social History History  Substance Use Topics  . Smoking status: Never Smoker   . Smokeless tobacco: Never Used  . Alcohol Use: 0.0 oz/week    1-2 Cans of beer per week    Allergies  Allergen Reactions  . Latex   . Nickel   . Neosporin [Neomycin-Bacitracin Zn-Polymyx] Rash    Current Outpatient Prescriptions  Medication Sig Dispense Refill  . albuterol (PROVENTIL HFA;VENTOLIN HFA) 108 (90 BASE) MCG/ACT inhaler Inhale 2 puffs into the lungs every 4 (four) hours as needed for wheezing or shortness of breath. 1 Inhaler 6  . aspirin 81 MG tablet Take 81 mg by mouth daily.    . cetirizine (ZYRTEC) 10 MG tablet Take 10 mg by mouth daily.    . Cholecalciferol (VITAMIN D PO) Take by mouth daily.    Marland Kitchen docusate sodium (COLACE) 100 MG capsule Take 100 mg by mouth 2 (two) times daily.    . Fluticasone Furoate-Vilanterol (BREO ELLIPTA) 100-25 MCG/INH AEPB Inhale 1 puff into the lungs daily. 60 each 5  . folic acid (FOLVITE) 294 MCG tablet Take  400 mcg by mouth daily.    Marland Kitchen gabapentin (NEURONTIN) 100 MG capsule Take 400mg  tid for x7 days, then increase to 500mg  po tid 410 capsule 2  . hydroxypropyl methylcellulose / hypromellose (ISOPTO TEARS / GONIOVISC) 2.5 % ophthalmic solution Place 1 drop into both eyes as needed for dry eyes.    . Misc Natural Products (TART CHERRY ADVANCED PO) Take 1,200 mg by mouth daily.    . Multiple Vitamin (MULTIVITAMIN) tablet Take 1 tablet by mouth daily.    . Omega-3 Fatty Acids (FISH OIL) 1200 MG CAPS Take by mouth.    . Spacer/Aero-Holding Chambers (E-Z SPACER) inhaler Use as instructed 1 each 2  . traMADol (ULTRAM) 50 MG tablet Take 1 tablet (50 mg total) by mouth every 6 (six) hours as needed. 40 tablet 0   No current facility-administered medications for this visit.    Review of Systems Review of Systems  Constitutional: Negative.   Respiratory:  Negative.   Cardiovascular: Negative.     Blood pressure 122/78, pulse 78, resp. rate 16, height 5\' 5"  (1.651 m), weight 172 lb (78.019 kg).  Physical Exam Physical Exam  Constitutional: She is oriented to person, place, and time. She appears well-developed and well-nourished.  Eyes: Conjunctivae are normal. No scleral icterus.  Cardiovascular: Normal rate, regular rhythm and normal heart sounds.   Pulmonary/Chest: Effort normal and breath sounds normal.  Abdominal: Soft. Normal appearance and bowel sounds are normal.  Port sites are clean and healing well.   Neurological: She is alert and oriented to person, place, and time.  Skin: Skin is warm and dry.    Data Reviewed Pathology showed chronic cholecystitis and cholelithiasis.  Assessment    Doing well post cholecystectomy.    Plan    Patient to return as needed.      PCP: Sonnie Alamo 09/24/2014, 4:38 PM

## 2014-10-01 ENCOUNTER — Ambulatory Visit (INDEPENDENT_AMBULATORY_CARE_PROVIDER_SITE_OTHER): Payer: BLUE CROSS/BLUE SHIELD | Admitting: *Deleted

## 2014-10-01 DIAGNOSIS — K8020 Calculus of gallbladder without cholecystitis without obstruction: Secondary | ICD-10-CM

## 2014-10-01 NOTE — [facility] Notes (Signed)
Patient came in today for a wound check.  The wound is clean, scant amount drainage noted. Dr. Bary Castilla made available for assessment, stitch visible, silver nitrate used. Continue neosporin and dry gauze as needed. Follow up as needed.

## 2014-10-01 NOTE — Patient Instructions (Signed)
The patient is aware to call back for any questions or concerns.  

## 2014-10-07 LAB — SURGICAL PATHOLOGY

## 2014-10-13 NOTE — Op Note (Signed)
PATIENT NAME:  Laura Underwood, Laura Underwood MR#:  [id number] DATE OF BIRTH:  03-18-1951  DATE OF PROCEDURE:  09/13/2014  PREOPERATIVE DIAGNOSIS: Chronic cholecystitis and cholelithiasis.   POSTOPERATIVE DIAGNOSIS: Chronic cholecystitis and cholelithiasis.   OPERATIVE PROCEDURE: Laparoscopic cholecystectomy, attempted cholangiogram.    OPERATING SURGEON: Hervey Ard, MD.     ANESTHESIA: General endotracheal under Dr. Benjamine Mola.   ESTIMATED BLOOD LOSS: Less than 5 mL.   CLINICAL NOTE: This 64 year old woman has had episodic right upper quadrant pain. Ultrasound showed evidence of cholelithiasis. The common bile duct was normal. Liver function studies 2 weeks ago showed a marked peak in her transaminases in the mid 300s, which were normal earlier this week. Impression was a passed common bile duct stone.    OPERATIVE NOTE: The patient underwent general endotracheal anesthesia without difficulty. The abdomen was prepped with ChloraPrep and draped. In Trendelenburg position a Veress needle was placed through a transumbilical incision. After assuring intraabdominal location with the hanging drop test the abdomen was insufflated with CO2 at 10 mmHg pressure. A 10 mm step port was expanded and inspection showed no evidence of injury from initial port placement. There was a small band of adhesions of the omentum to the anterior abdominal wall below the level of the umbilicus that did not interfere with the procedure. The patient was placed in reverse Trendelenburg position and rolled to the left. An 11 mm XL port was placed in the epigastrium and two 5-mm ports placed laterally. The gallbladder was noted to be essentially free of inflammation and on a fairly noninflamed mesentery. The neck of the gallbladder was identified and a generous sized cystic duct tracked down to its junction with the common bile duct. The cystic duct and cystic artery were cleared and a photograph of the exposed liver plate obtained to confirm  the critical view of safety. It was elected to proceed with cholangiogram making use of a Kumar clamp. In spite of multiple repositioning and milking the cystic duct contrast could not be passed through the cystic duct. The cystic duct was clipped adjacent to the gallbladder and the cystic duct opened. The duct was again milked and only a small amount of clear bile noted. The cystic duct was doubly clipped and divided as was the cystic artery. The gallbladder was removed from the liver bed making use of hook cautery dissection. It was delivered through the umbilical port site where innumerable 4-5 mm stones were noted as well as thick dark green bile. The gallbladder sent to pathology for routine evaluation. After re-establishing the pneumoperitoneum the right upper quadrant was irrigated with lactated Ringer solution. Good hemostasis was noted. The abdomen was then desufflated and ports removed under direct vision. The umbilical fascia was closed with a single 0 Vicryl figure-of-eight suture. Skin incisions were closed with 4-0 Vicryl subcuticular sutures. Benzoin, Steri-Strips, Telfa, and Tegaderm dressings were applied.   The patient was taken to the recovery room in stable condition.    ____________________________ Robert Bellow, MD jwb:bu D: 09/13/2014 08:50:03 ET T: 09/13/2014 11:43:43 ET JOB#: [id number]  cc: Robert Bellow, MD, <Dictator> Eduard Clos. Gilford Rile, MD Khya Halls Amedeo Kinsman MD ELECTRONICALLY SIGNED 09/14/2014 8:34

## 2014-11-14 ENCOUNTER — Ambulatory Visit (INDEPENDENT_AMBULATORY_CARE_PROVIDER_SITE_OTHER): Payer: BLUE CROSS/BLUE SHIELD | Admitting: Pulmonary Disease

## 2014-11-14 ENCOUNTER — Encounter: Payer: Self-pay | Admitting: Pulmonary Disease

## 2014-11-14 VITALS — BP 126/74 | HR 66 | Ht 65.0 in | Wt 178.0 lb

## 2014-11-14 DIAGNOSIS — K219 Gastro-esophageal reflux disease without esophagitis: Secondary | ICD-10-CM

## 2014-11-14 DIAGNOSIS — R05 Cough: Secondary | ICD-10-CM | POA: Diagnosis not present

## 2014-11-14 DIAGNOSIS — J3089 Other allergic rhinitis: Secondary | ICD-10-CM | POA: Diagnosis not present

## 2014-11-14 DIAGNOSIS — R053 Chronic cough: Secondary | ICD-10-CM

## 2014-11-14 NOTE — Progress Notes (Signed)
Subjective:    Patient ID: Laura Underwood, female    DOB: [birth year], 64 y.o.   MRN: [id number]  Synopsis: Ms. Adler reestablish care with [facility] in 2015 for chronic cough with upper airway cough syndrome, positive methacholine challenge test, and possible acid reflux. Previously treated with gabapentin and Elavil for laryngeal sensitivity leading to cough. She had a normal simple spirometry test in 2012.  She has had a history of a negative esophageal pH probe, chest x-ray in June 2015 was normal.  HPI  Chief Complaint  Patient presents with  . Follow-up    Pt's nonprod cough triggered by perfumes, strong smells, and heat/humidity.  States it is improving since last visit.      Cough has improved quite a bit. Certain smells still set her off, peppermint makes it worse.  Some days are worse than others. She still has a coughing spell at least once per day.  She used to cough a lot in the mornings, but now it is more nighttime symptoms.  She says that she has no dyspnea or wheezing.  She uses ventolin for the cough.  She continues to use water daily to keep it at [place], but she still has trouble.  Despite the negative pH probe last year she thinks taht she has acid reflux because she still feels like she chokes up and coughs more when she lay flat.  She is not currently taking an antiacid.  She has a little sinus congestion right now.  Not using nasal steroid.   Past Medical History  Diagnosis Date  . Diverticulosis   . Cough   . Shortness of breath   . Colon polyp   . Irritable bowel disease   . Abdominal pain, other specified site   . Hemorrhoids   . Stress   . Dizziness   . Headache(784.0)   . Wears glasses   . Arthritis   . Menopause   . GERD (gastroesophageal reflux disease)   . Chronic cough     s/p evaluation by allergist and pulmonologist   . Hiatal hernia   . Goiter   . Diabetes mellitus without complication   . Asthma     Dr Lake Bells     Review of  Systems  Constitutional: Negative for fever, chills and fatigue.  HENT: Positive for postnasal drip. Negative for sneezing and sore throat.   Respiratory: Positive for cough. Negative for shortness of breath and wheezing.        Objective:   Physical Exam Filed Vitals:   11/14/14 0957  BP: 126/74  Pulse: 66  Height: 5\' 5"  (1.651 m)  Weight: 178 lb (80.74 kg)  SpO2: 100%    Gen: well appearing, no acute distress HEENT: NCAT, EOMi, OP clear PULM: CTA B CV: RRR, no mgr, no JVD AB: BS+, soft, nontender Ext: warm, no edema, no clubbing, no cyanosis Derm: no rash or skin breakdown Neuro: A&Ox4, MAEW      Assessment & Plan:   Chronic cough Her cough continues to be a problem. It has improved significantly since when I first started seeing her, the addition of gabapentin seems to have helped. However, she continues to have daily coughing spells which are quite bothersome and are associated with stress incontinence. This has limited her ability to travel.  It sounds as if she has acid reflux symptoms despite the history of a negative esophageal pH probe. She says that she feels that she has heartburn. She also has postnasal  drip.  Plan: Gastroesophageal reflux lifestyle modifications were reviewed Take Prilosec daily Use Nasacort 2 puffs each nostril daily Use Zyrtec daily If no improvement in 4 weeks then increase gabapentin to maximum dose of 1800 mg a day (600 mg 3 times a day) Return to clinic in 2 months, if no improvement at that point, consider the additional of Elavil daily at bedtime versus enrollment in a clinical trial for a novel agent targeted at chronic idiopathic cough (we discussed this briefly today and she is interested).   Allergic rhinitis This is worse, I have advised that she use Nasacort regularly and continue Zyrtec.   GERD (gastroesophageal reflux disease) Despite the history of a negative pH probe (2014?), She says that she is feeling symptoms  consistent with acid reflux and that she has heartburn and indigestion which is worse when she lies flat. She says this is associated with worsening cough. She is currently not taking a proton pump inhibitor.  Plan: Gastroesophageal reflux disease lifestyle modifications reviewed Start taking Prilosec over-the-counter daily  [place] and 25 minutes spent in consultation with the patient regarding her chronic cough and comorbid conditions     Updated Medication List Outpatient Encounter Prescriptions as of 11/14/2014  Medication Sig  . albuterol (PROVENTIL HFA;VENTOLIN HFA) 108 (90 BASE) MCG/ACT inhaler Inhale 2 puffs into the lungs every 4 (four) hours as needed for wheezing or shortness of breath.  Marland Kitchen aspirin 81 MG tablet Take 81 mg by mouth daily.  . cetirizine (ZYRTEC) 10 MG tablet Take 10 mg by mouth daily.  . Cholecalciferol (VITAMIN D PO) Take by mouth daily.  Marland Kitchen docusate sodium (COLACE) 100 MG capsule Take 100 mg by mouth 2 (two) times daily.  . Fluticasone Furoate-Vilanterol (BREO ELLIPTA) 100-25 MCG/INH AEPB Inhale 1 puff into the lungs daily.  . folic acid (FOLVITE) 756 MCG tablet Take 400 mcg by mouth daily.  Marland Kitchen gabapentin (NEURONTIN) 100 MG capsule Take 400mg  tid for x7 days, then increase to 500mg  po tid  . hydroxypropyl methylcellulose / hypromellose (ISOPTO TEARS / GONIOVISC) 2.5 % ophthalmic solution Place 1 drop into both eyes as needed for dry eyes.  . Misc Natural Products (TART CHERRY ADVANCED PO) Take 1,200 mg by mouth daily.  . Multiple Vitamin (MULTIVITAMIN) tablet Take 1 tablet by mouth daily.  . Omega-3 Fatty Acids (FISH OIL) 1200 MG CAPS Take by mouth.  . Spacer/Aero-Holding Chambers (E-Z SPACER) inhaler Use as instructed  . traMADol (ULTRAM) 50 MG tablet Take 1 tablet (50 mg total) by mouth every 6 (six) hours as needed.   No facility-administered encounter medications on file as of 11/14/2014.

## 2014-11-14 NOTE — Assessment & Plan Note (Signed)
This is worse, I have advised that she use Nasacort regularly and continue Zyrtec.

## 2014-11-14 NOTE — Assessment & Plan Note (Addendum)
Despite the history of a negative pH probe (2014?), She says that she is feeling symptoms consistent with acid reflux and that she has heartburn and indigestion which is worse when she lies flat. She says this is associated with worsening cough. She is currently not taking a proton pump inhibitor.  Plan: Gastroesophageal reflux disease lifestyle modifications reviewed Start taking Prilosec over-the-counter daily  Greater than 25 minutes spent in consultation with the patient regarding her chronic cough and comorbid conditions

## 2014-11-14 NOTE — Patient Instructions (Addendum)
Take Prilosec OTC daily for 4-6 weeks Take Nasacort 2 puffs each nostril daily  Take zyrtec daily If you still have the cough in 4 weeks, then increase the gabapentin to 600mg  tid F/u in 2 months or sooner if needed

## 2014-11-14 NOTE — Assessment & Plan Note (Signed)
Her cough continues to be a problem. It has improved significantly since when I first started seeing her, the addition of gabapentin seems to have helped. However, she continues to have daily coughing spells which are quite bothersome and are associated with stress incontinence. This has limited her ability to travel.  It sounds as if she has acid reflux symptoms despite the history of a negative esophageal pH probe. She says that she feels that she has heartburn. She also has postnasal drip.  Plan: Gastroesophageal reflux lifestyle modifications were reviewed Take Prilosec daily Use Nasacort 2 puffs each nostril daily Use Zyrtec daily If no improvement in 4 weeks then increase gabapentin to maximum dose of 1800 mg a day (600 mg 3 times a day) Return to clinic in 2 months, if no improvement at that point, consider the additional of Elavil daily at bedtime versus enrollment in a clinical trial for a novel agent targeted at chronic idiopathic cough (we discussed this briefly today and she is interested).

## 2014-11-15 ENCOUNTER — Ambulatory Visit: Payer: BLUE CROSS/BLUE SHIELD | Admitting: Pulmonary Disease

## 2014-11-26 ENCOUNTER — Other Ambulatory Visit: Payer: Self-pay | Admitting: Pulmonary Disease

## 2014-11-26 MED ORDER — GABAPENTIN 100 MG PO CAPS: ORAL_CAPSULE | ORAL | Status: DC

## 2014-11-26 NOTE — Telephone Encounter (Signed)
Received a paper refill request from [facility] to refill pt's gabapentin. Refill electronically sent to pt's pharmacy. Nothing further is needed at this time.

## 2014-12-03 ENCOUNTER — Ambulatory Visit (INDEPENDENT_AMBULATORY_CARE_PROVIDER_SITE_OTHER): Payer: BLUE CROSS/BLUE SHIELD | Admitting: Internal Medicine

## 2014-12-03 ENCOUNTER — Encounter: Payer: Self-pay | Admitting: Internal Medicine

## 2014-12-03 VITALS — BP 110/69 | HR 62 | Temp 98.2°F | Ht 65.0 in | Wt 177.1 lb

## 2014-12-03 DIAGNOSIS — E119 Type 2 diabetes mellitus without complications: Secondary | ICD-10-CM

## 2014-12-03 DIAGNOSIS — R053 Chronic cough: Secondary | ICD-10-CM

## 2014-12-03 DIAGNOSIS — R05 Cough: Secondary | ICD-10-CM

## 2014-12-03 DIAGNOSIS — E669 Obesity, unspecified: Secondary | ICD-10-CM

## 2014-12-03 LAB — COMPREHENSIVE METABOLIC PANEL
ALBUMIN: 4.3 g/dL (ref 3.5–5.2)
ALT: 22 U/L (ref 0–35)
AST: 22 U/L (ref 0–37)
Alkaline Phosphatase: 46 U/L (ref 39–117)
BUN: 12 mg/dL (ref 6–23)
CHLORIDE: 105 meq/L (ref 96–112)
CO2: 30 meq/L (ref 19–32)
CREATININE: 0.66 mg/dL (ref 0.40–1.20)
Calcium: 9.4 mg/dL (ref 8.4–10.5)
GFR: 116.34 mL/min (ref >60.00)
GLUCOSE: 100 mg/dL — AB (ref 70–99)
Potassium: 4.3 mEq/L (ref 3.5–5.1)
Sodium: 139 mEq/L (ref 135–145)
Total Bilirubin: 0.3 mg/dL (ref 0.2–1.2)
Total Protein: 7 g/dL (ref 6.0–8.3)

## 2014-12-03 LAB — HEMOGLOBIN A1C: HEMOGLOBIN A1C: 6.2 % (ref 4.6–6.5)

## 2014-12-03 NOTE — Patient Instructions (Signed)
Labs today.  Follow up in 3 months or sooner as needed. 

## 2014-12-03 NOTE — Progress Notes (Signed)
Subjective:    Patient ID: Laura Underwood, female    DOB: [birth year], 64 y.o.   MRN: [id number]  HPI  64YO female presents for follow up.  Feeling well. Had cholecystectomy and recovered well afterward. No pain, no diarrhea, no NV.  Seen by Dr. Lake Bells for chronic cough. Using Ventolin 1-2 times per day. Planning to increase Gabapentin later this month to help with cough, per his instructions. May eventually add Elavil. Started back on Prilosec and Nasacort. Considering research trial.  DM - BG mostly near 100. Highest 132. Recently completed sugar challenge with no added sugar for 10 days. Continues to follow at [place] [facility] for nutrition counseling.  Past medical, surgical, family and social history per today's encounter.  Review of Systems  Constitutional: Negative for fever, chills, appetite change, fatigue and unexpected weight change.  Eyes: Negative for visual disturbance.  Respiratory: Positive for cough. Negative for shortness of breath.   Cardiovascular: Negative for chest pain and leg swelling.  Gastrointestinal: Negative for nausea, vomiting, abdominal pain, diarrhea and constipation.  Musculoskeletal: Negative for myalgias and arthralgias.  Skin: Negative for color change and rash.  Hematological: Negative for adenopathy. Does not bruise/bleed easily.  Psychiatric/Behavioral: Negative for sleep disturbance and dysphoric mood. The patient is not nervous/anxious.        Objective:    BP 110/69 mmHg  Pulse 62  Temp(Src) 98.2 F (36.8 C) (Oral)  Ht 5\' 5"  (1.651 m)  Wt 177 lb 2 oz (80.343 kg)  BMI 29.48 kg/m2  SpO2 99% Physical Exam  Constitutional: She is oriented to person, place, and time. She appears well-developed and well-nourished. No distress.  HENT:  Head: Normocephalic and atraumatic.  Right Ear: External ear normal.  Left Ear: External ear normal.  Nose: Nose normal.  Mouth/Throat: Oropharynx is clear and moist. No oropharyngeal exudate.    Eyes: Conjunctivae are normal. Pupils are equal, round, and reactive to light. Right eye exhibits no discharge. Left eye exhibits no discharge. No scleral icterus.  Neck: Normal range of motion. Neck supple. No tracheal deviation present. No thyromegaly present.  Cardiovascular: Normal rate, regular rhythm, normal heart sounds and intact distal pulses.  Exam reveals no gallop and no friction rub.   No murmur heard. Pulmonary/Chest: Effort normal and breath sounds normal. No respiratory distress. She has no wheezes. She has no rales. She exhibits no tenderness.  Musculoskeletal: Normal range of motion. She exhibits no edema or tenderness.  Lymphadenopathy:    She has no cervical adenopathy.  Neurological: She is alert and oriented to person, place, and time. No cranial nerve deficit. She exhibits normal muscle tone. Coordination normal.  Skin: Skin is warm and dry. No rash noted. She is not diaphoretic. No erythema. No pallor.  Psychiatric: She has a normal mood and affect. Her behavior is normal. Judgment and thought content normal.          Assessment & Plan:   Problem List Items Addressed This Visit      Unprioritized   Chronic cough    Reviewed notes from Dr. Lake Bells. Planning to increase neurontin to max 600mg  po tid and then add Elavil if symptoms poorly controlled.       Diabetes mellitus type 2, controlled - Primary    Will check A1c with labs today. Continue healthy diet and exercise.      Relevant Orders   Comprehensive metabolic panel   Hemoglobin A1c   Obesity (BMI 30-39.9)    Wt Readings from Last 3  Encounters:  12/03/14 177 lb 2 oz (80.343 kg)  11/14/14 178 lb (80.74 kg)  09/23/14 172 lb (78.019 kg)   Body mass index is 29.48 kg/(m^2). Encouraged healthy diet and exercise. Reviewed recent nutrition notes from [facility].          Return in about 3 months (around 03/05/2015) for Recheck of Diabetes.

## 2014-12-03 NOTE — Assessment & Plan Note (Signed)
Wt Readings from Last 3 Encounters:  12/03/14 177 lb 2 oz (80.343 kg)  11/14/14 178 lb (80.74 kg)  09/23/14 172 lb (78.019 kg)   Body mass index is 29.48 kg/(m^2). Encouraged healthy diet and exercise. Reviewed recent nutrition notes from [facility].

## 2014-12-03 NOTE — Progress Notes (Signed)
Pre visit review using our clinic review tool, if applicable. No additional management support is needed unless otherwise documented below in the visit note. 

## 2014-12-03 NOTE — Assessment & Plan Note (Signed)
Reviewed notes from Dr. Lake Bells. Planning to increase neurontin to max 600mg  po tid and then add Elavil if symptoms poorly controlled.

## 2014-12-03 NOTE — Assessment & Plan Note (Signed)
Will check A1c with labs today. Continue healthy diet and exercise.

## 2014-12-31 ENCOUNTER — Other Ambulatory Visit: Payer: Self-pay

## 2014-12-31 MED ORDER — FLUTICASONE FUROATE-VILANTEROL 100-25 MCG/INH IN AEPB: 1.0000 | INHALATION_SPRAY | Freq: Every day | RESPIRATORY_TRACT | Status: DC

## 2015-01-31 ENCOUNTER — Ambulatory Visit: Payer: BLUE CROSS/BLUE SHIELD | Admitting: Pulmonary Disease

## 2015-02-19 ENCOUNTER — Telehealth: Payer: Self-pay | Admitting: *Deleted

## 2015-02-19 NOTE — Telephone Encounter (Signed)
Called and spoke with pt Pt stated that the reason her insurance expired was due to her husband no longer being policy holder Pt is now the [employer] Group # [id number] Member # [id number] Effective 02-13-15  Will forward to Misty to follow up PA

## 2015-02-19 NOTE — Telephone Encounter (Signed)
LMOM for pt to return call. [place] states pt's policy expired on 06/15/56. Tried to contact pt to get updated insurance information so that we can proceed with the PA for Ventolin. Will await pt's call.

## 2015-02-19 NOTE — Telephone Encounter (Signed)
Pt returning call to [facility] and can be reached @ [phone].Hillery Hunter

## 2015-02-20 NOTE — Telephone Encounter (Signed)
LMOM for pt regarding the insurance. The policy number and group number pt gave is the same number I gave and [employer] stated that the policy expired 0/35/46. Informed pt she may want to call and speak with them and to give [place] a call when she does due to they will not let me start a PA on her Ventolin due to them saying the policy is expired. Will await a call back from pt.

## 2015-02-20 NOTE — Telephone Encounter (Signed)
Pt returning calling about the ins issue says that she does has ins and that it will take 7-10 days to get everything straight she is on husband if off and can be reached @ [phone] says ins messed-up something.Laura Underwood

## 2015-02-21 NOTE — Telephone Encounter (Signed)
Spoke with pt and when insurance is corrected she will have pharmacy to rerun rx and if needs PA she will have them to fax it to [place]. Nothing further needed at this time.

## 2015-03-03 ENCOUNTER — Encounter: Payer: Self-pay | Admitting: Pulmonary Disease

## 2015-03-03 ENCOUNTER — Encounter: Payer: Self-pay | Admitting: Internal Medicine

## 2015-03-05 ENCOUNTER — Ambulatory Visit: Payer: BLUE CROSS/BLUE SHIELD | Admitting: Internal Medicine

## 2015-03-24 ENCOUNTER — Encounter: Payer: Self-pay | Admitting: Pulmonary Disease

## 2015-03-24 ENCOUNTER — Ambulatory Visit (INDEPENDENT_AMBULATORY_CARE_PROVIDER_SITE_OTHER): Payer: BLUE CROSS/BLUE SHIELD | Admitting: Pulmonary Disease

## 2015-03-24 VITALS — BP 126/78 | HR 103 | Ht 65.0 in | Wt 180.0 lb

## 2015-03-24 DIAGNOSIS — R053 Chronic cough: Secondary | ICD-10-CM

## 2015-03-24 DIAGNOSIS — R05 Cough: Secondary | ICD-10-CM | POA: Diagnosis not present

## 2015-03-24 MED ORDER — FLUTICASONE FUROATE-VILANTEROL 100-25 MCG/INH IN AEPB: 1.0000 | INHALATION_SPRAY | Freq: Every day | RESPIRATORY_TRACT | Status: DC

## 2015-03-24 MED ORDER — ALBUTEROL SULFATE HFA 108 (90 BASE) MCG/ACT IN AERS: 2.0000 | INHALATION_SPRAY | RESPIRATORY_TRACT | Status: DC | PRN

## 2015-03-24 MED ORDER — AMITRIPTYLINE HCL 150 MG PO TABS: 25.0000 mg | ORAL_TABLET | Freq: Every day | ORAL | Status: DC

## 2015-03-24 NOTE — Progress Notes (Signed)
Subjective:    Patient ID: Laura Underwood, female    DOB: 07/11/1950, 64 y.o.   MRN: [id number]  Synopsis: Ms. 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 Previously treated with gabapentin and Elavil for laryngeal sensitivity leading to cough. She had a normal simple spirometry test in 2012.  She has had a history of a negative esophageal pH probe, chest x-ray in June 2015 was normal.  HPI  Chief Complaint  Patient presents with  . Follow-up    pt states cough is stable.  has stopped everything but ventolin and allergy meds and cough is unchanged.     Cough is about the same as before.  She had a lot of problems with her insurance provider recently and had to stop taking her medications.  She ended up going off of all of her medications except for her albuterol.  She now is back on her medicaions, still having a coughing spell about once per day. She says that some days are better than others.  She sometimes wears a face mask which helps with her dyspnea.  She is still doing the "sip and zip" strategy.  She is not taking the neurontin at this point and has not restarted it.  She is not taking the Breo.  She still has the cough pills we gave her, but she doesn't think they help much (tramadol).  She stil feels like certain smells and humidity makes her cough.    Past Medical History  Diagnosis Date  . Diverticulosis   . Cough   . Shortness of breath   . Colon polyp   . Irritable bowel disease   . Abdominal pain, other specified site   . Hemorrhoids   . Stress   . Dizziness   . Headache(784.0)   . Wears glasses   . Arthritis   . Menopause   . GERD (gastroesophageal reflux disease)   . Chronic cough     s/p evaluation by allergist and pulmonologist   . Hiatal hernia   . Goiter   . Diabetes mellitus without complication ([place])   . Asthma     Dr Lake Bells       Review of Systems  Constitutional: Negative for fever, chills and fatigue.  HENT: Positive for postnasal drip. Negative for sneezing and sore throat.   Respiratory: Positive for cough. Negative for shortness of breath and wheezing.        Objective:   Physical Exam Filed Vitals:   03/24/15 1351  BP: 126/78  Pulse: 103  Height: 5\' 5"  (1.651 m)  Weight: 180 lb (81.647 kg)  SpO2: 96%    Gen: well appearing, no acute distress HEENT: NCAT, EOMi, OP clear PULM: CTA B CV: RRR, no mgr, no JVD AB: BS+, soft, nontender Ext: warm, no edema, no clubbing, no cyanosis Derm: no rash or skin breakdown Neuro: A&Ox4, MAEW      Assessment & Plan:   Chronic cough This has been a stable interval for her but unfortunately her cough has returned somewhat since she has been off of her medications. To summarize, she's had this symptom for quite a long time and she said that she had some improvement with the Neurontin but she did not have complete relief despite being at the maximum dose. Her cough sounds a little bit worse to me today. On exam her lungs are clear.  She may  or may not have some very mild asthma which is been accessory dated by a recent cold, and the fact that she's not been taking her Breo.  Plan Restart Breo Start Elavil 25mg  po qHS (QTc OK 2016) Continue prn albuterol Continue voice rest  Consider enrollment in phase three version of Afferent trial F/u 3 months    Updated Medication List Outpatient Encounter Prescriptions as of 03/24/2015  Medication Sig  . albuterol (PROVENTIL HFA;VENTOLIN HFA) 108 (90 BASE) MCG/ACT inhaler Inhale 2 puffs into the lungs every 4 (four) hours as needed for wheezing or shortness of breath.  Marland Kitchen aspirin 81 MG tablet Take 81 mg by mouth daily.  . cetirizine (ZYRTEC) 10 MG tablet Take 10 mg by mouth daily.  . Cholecalciferol (VITAMIN D PO) Take by mouth daily.  Marland Kitchen docusate sodium (COLACE) 100 MG capsule Take 100 mg by mouth 2 (two) times  daily.  . folic acid (FOLVITE) 378 MCG tablet Take 400 mcg by mouth daily.  . hydroxypropyl methylcellulose / hypromellose (ISOPTO TEARS / GONIOVISC) 2.5 % ophthalmic solution Place 1 drop into both eyes as needed for dry eyes.  . Misc Natural Products (TART CHERRY ADVANCED PO) Take 1,200 mg by mouth daily.  . Multiple Vitamin (MULTIVITAMIN) tablet Take 1 tablet by mouth daily.  . Omega-3 Fatty Acids (FISH OIL) 1200 MG CAPS Take by mouth.  . [DISCONTINUED] traMADol (ULTRAM) 50 MG tablet Take 1 tablet (50 mg total) by mouth every 6 (six) hours as needed.  Marland Kitchen amitriptyline (ELAVIL) 150 MG tablet Take 0.5 tablets (75 mg total) by mouth at bedtime.  . [DISCONTINUED] Fluticasone Furoate-Vilanterol (BREO ELLIPTA) 100-25 MCG/INH AEPB Inhale 1 puff into the lungs daily. (Patient not taking: Reported on 03/24/2015)  . [DISCONTINUED] gabapentin (NEURONTIN) 100 MG capsule Take 500mg  by mouth TID (Patient not taking: Reported on 03/24/2015)  . [DISCONTINUED] Omeprazole Magnesium (PRILOSEC OTC PO) Take by mouth.  . [DISCONTINUED] Spacer/Aero-Holding Chambers (E-Z SPACER) inhaler Use as instructed (Patient not taking: Reported on 03/24/2015)  . [DISCONTINUED] Triamcinolone Acetonide (NASACORT ALLERGY 24HR NA) Place into the nose.   No facility-administered encounter medications on file as of 03/24/2015.

## 2015-03-24 NOTE — Addendum Note (Signed)
Addended by: Len Blalock on: 03/24/2015 04:40 PM   Modules accepted: Orders, Medications

## 2015-03-24 NOTE — Assessment & Plan Note (Signed)
This has been a stable interval for her but unfortunately her cough has returned somewhat since she has been off of her medications. To summarize, she's had this symptom for quite a long time and she said that she had some improvement with the Neurontin but she did not have complete relief despite being at the maximum dose. Her cough sounds a little bit worse to me today. On exam her lungs are clear.  She may or may not have some very mild asthma which is been accessory dated by a recent cold, and the fact that she's not been taking her Breo.  Plan Restart Breo Start Elavil 25mg  po qHS (QTc OK 2016) Continue prn albuterol Continue voice rest  Consider enrollment in phase three version of Afferent trial F/u 3 months

## 2015-03-24 NOTE — Patient Instructions (Signed)
Take the elavil at night for the cough (I recommend you take it every night) Continue taking the Breo daily and albuterol as needed We will see you back in 3 months or sooner if needed

## 2015-03-31 ENCOUNTER — Ambulatory Visit (INDEPENDENT_AMBULATORY_CARE_PROVIDER_SITE_OTHER): Payer: BLUE CROSS/BLUE SHIELD | Admitting: Internal Medicine

## 2015-03-31 ENCOUNTER — Encounter: Payer: Self-pay | Admitting: Internal Medicine

## 2015-03-31 VITALS — BP 119/80 | HR 66 | Temp 98.1°F | Ht 65.0 in | Wt 181.1 lb

## 2015-03-31 DIAGNOSIS — R05 Cough: Secondary | ICD-10-CM | POA: Diagnosis not present

## 2015-03-31 DIAGNOSIS — R053 Chronic cough: Secondary | ICD-10-CM

## 2015-03-31 DIAGNOSIS — E669 Obesity, unspecified: Secondary | ICD-10-CM

## 2015-03-31 DIAGNOSIS — E119 Type 2 diabetes mellitus without complications: Secondary | ICD-10-CM

## 2015-03-31 LAB — LIPID PANEL
CHOL/HDL RATIO: 4
CHOLESTEROL: 169 mg/dL (ref 0–200)
HDL: 48.1 mg/dL (ref >39.00)
LDL Cholesterol: 110 mg/dL — ABNORMAL HIGH (ref 0–99)
NonHDL: 121.06
Triglycerides: 55 mg/dL (ref 0.0–149.0)
VLDL: 11 mg/dL (ref 0.0–40.0)

## 2015-03-31 LAB — COMPREHENSIVE METABOLIC PANEL
ALK PHOS: 44 U/L (ref 39–117)
ALT: 18 U/L (ref 0–35)
AST: 20 U/L (ref 0–37)
Albumin: 4.1 g/dL (ref 3.5–5.2)
BILIRUBIN TOTAL: 0.2 mg/dL (ref 0.2–1.2)
BUN: 8 mg/dL (ref 6–23)
CALCIUM: 9.2 mg/dL (ref 8.4–10.5)
CO2: 30 mEq/L (ref 19–32)
Chloride: 106 mEq/L (ref 96–112)
Creatinine, Ser: 0.64 mg/dL (ref 0.40–1.20)
GFR: 120.42 mL/min (ref >60.00)
GLUCOSE: 96 mg/dL (ref 70–99)
Potassium: 5.1 mEq/L (ref 3.5–5.1)
Sodium: 143 mEq/L (ref 135–145)
TOTAL PROTEIN: 6.6 g/dL (ref 6.0–8.3)

## 2015-03-31 LAB — MICROALBUMIN / CREATININE URINE RATIO
CREATININE, U: 28 mg/dL
MICROALB/CREAT RATIO: 2.5 mg/g (ref 0.0–30.0)
Microalb, Ur: <0.7 mg/dL (ref 0.0–1.9)

## 2015-03-31 LAB — HEMOGLOBIN A1C: Hgb A1c MFr Bld: 6.3 % (ref 4.6–6.5)

## 2015-03-31 NOTE — Assessment & Plan Note (Signed)
Symptoms improved with recent addition of Elavil by Dr. Lake Bells. Will continue.

## 2015-03-31 NOTE — Progress Notes (Signed)
Subjective:    Patient ID: Laura Underwood, female    DOB: [birth year], 64 y.o.   MRN: [id number]  HPI  64YO female presents for follow up.  DM - BG mostly near 110s. Compliant with healthy diet.  Chronic cough - Started on Elavil by Dr. Lake Bells. Seems to be helping. Some drowsiness in the morning, however improving. Compliant with inhalers.  Abdominal pain - Mid lower abdomen, off and on for years. Aching pain. She has attributed to diverticulitis. Recently improved by adding yogurt. No current pain. No NVD.   Wt Readings from Last 3 Encounters:  03/31/15 181 lb 2 oz (82.158 kg)  03/24/15 180 lb (81.647 kg)  12/03/14 177 lb 2 oz (80.343 kg)   BP Readings from Last 3 Encounters:  03/31/15 119/80  03/24/15 126/78  12/03/14 110/69    Past Medical History  Diagnosis Date  . Diverticulosis   . Cough   . Shortness of breath   . Colon polyp   . Irritable bowel disease   . Abdominal pain, other specified site   . Hemorrhoids   . Stress   . Dizziness   . Headache(784.0)   . Wears glasses   . Arthritis   . Menopause   . GERD (gastroesophageal reflux disease)   . Chronic cough     s/p evaluation by allergist and pulmonologist   . Hiatal hernia   . Goiter   . Diabetes mellitus without complication ([place])   . Asthma     Dr Lake Bells   Family History  Problem Relation Age of Onset  . Stroke Mother   . Hypertension Mother   . Hyperlipidemia Mother   . Depression Mother   . Hypertension Sister   . Prostate cancer Father   . Liver cancer Father     metastatic liver  . Breast cancer Maternal Aunt   . Depression Maternal Aunt   . Depression Maternal Grandfather   . Depression Cousin   . Pancreatic cancer Paternal Aunt   . Pancreatic cancer Paternal Grandmother    Past Surgical History  Procedure Laterality Date  . Cesarean section  [phone]  . Cesarean section  [phone]  . Ovarin cyst  2004    ovary removed  . Jaw abscess  jan 2012  . Laparoscopy  1981  .  Esophagogastroduodenoscopy N/A 08/15/2013    Procedure: ESOPHAGOGASTRODUODENOSCOPY (EGD);  Surgeon: Lear Ng, MD;  Location: [facility];  Service: Endoscopy;  Laterality: N/A;  . Bravo ph study N/A 08/15/2013    Procedure: BRAVO [place];  Surgeon: Lear Ng, MD;  Location: [facility];  Service: Endoscopy;  Laterality: N/A;  . Colonoscopy N/A 08/15/2013    Procedure: COLONOSCOPY;  Surgeon: Lear Ng, MD;  Location: [facility];  Service: Endoscopy;  Laterality: N/A;  . Cholecystectomy  09/13/14   Social History   Social History  . Marital Status: Married    Spouse Name: N/A  . Number of Children: Y  . Years of Education: N/A   Occupational History  . RN, [employer], coder,office mgr.    Social History Main Topics  . Smoking status: Never Smoker   . Smokeless tobacco: Never Used  . Alcohol Use: 0.0 oz/week    1-2 Cans of beer per week  . Drug Use: No  . Sexual Activity: Not Asked   Other Topics Concern  . None   Social History Narrative   Lives in [place]. 3 children, daughters live nearby, son lives in [place].  Work - Ecologist, Therapist, sports      Diet - healthy, limited caffeine    Review of Systems  Constitutional: Negative for fever, chills, appetite change, fatigue and unexpected weight change.  Eyes: Negative for visual disturbance.  Respiratory: Negative for shortness of breath.   Cardiovascular: Negative for chest pain and leg swelling.  Gastrointestinal: Negative for nausea, vomiting, abdominal pain, diarrhea, constipation and abdominal distention.  Musculoskeletal: Negative for myalgias and arthralgias.  Skin: Negative for color change and rash.  Hematological: Negative for adenopathy. Does not bruise/bleed easily.  Psychiatric/Behavioral: Negative for dysphoric mood. The patient is not nervous/anxious.        Objective:    BP 119/80 mmHg  Pulse 66  Temp(Src) 98.1 F (36.7 C) (Oral)  Ht 5\' 5"  (1.651 m)   Wt 181 lb 2 oz (82.158 kg)  BMI 30.14 kg/m2  SpO2 100% Physical Exam  Constitutional: She is oriented to person, place, and time. She appears well-developed and well-nourished. No distress.  HENT:  Head: Normocephalic and atraumatic.  Right Ear: External ear normal.  Left Ear: External ear normal.  Nose: Nose normal.  Mouth/Throat: Oropharynx is clear and moist. No oropharyngeal exudate.  Eyes: Conjunctivae are normal. Pupils are equal, round, and reactive to light. Right eye exhibits no discharge. Left eye exhibits no discharge. No scleral icterus.  Neck: Normal range of motion. Neck supple. No tracheal deviation present. No thyromegaly present.  Cardiovascular: Normal rate, regular rhythm, normal heart sounds and intact distal pulses.  Exam reveals no gallop and no friction rub.   No murmur heard. Pulmonary/Chest: Effort normal and breath sounds normal. No accessory muscle usage. No respiratory distress. She has no decreased breath sounds. She has no wheezes. She has no rhonchi. She has no rales. She exhibits no tenderness.  Musculoskeletal: Normal range of motion. She exhibits no edema or tenderness.  Lymphadenopathy:    She has no cervical adenopathy.  Neurological: She is alert and oriented to person, place, and time. No cranial nerve deficit. She exhibits normal muscle tone. Coordination normal.  Skin: Skin is warm and dry. No rash noted. She is not diaphoretic. No erythema. No pallor.  Psychiatric: She has a normal mood and affect. Her behavior is normal. Judgment and thought content normal.          Assessment & Plan:   Problem List Items Addressed This Visit      Unprioritized   Chronic cough    Symptoms improved with recent addition of Elavil by Dr. Lake Bells. Will continue.      Diabetes mellitus type 2, controlled ([place]) - Primary    BG well controlled. A1c with labs today. Continue healthy diet.      Relevant Orders   Comprehensive metabolic panel   Hemoglobin  A1c   Lipid panel   Microalbumin / creatinine urine ratio   Ambulatory referral to Ophthalmology   Obesity (BMI 30-39.9)    Wt Readings from Last 3 Encounters:  03/31/15 181 lb 2 oz (82.158 kg)  03/24/15 180 lb (81.647 kg)  12/03/14 177 lb 2 oz (80.343 kg)   Body mass index is 30.14 kg/(m^2). Encouraged healthy diet and exercise.          Return in about 3 months (around 07/01/2015) for Recheck of Diabetes.

## 2015-03-31 NOTE — Assessment & Plan Note (Signed)
BG well controlled. A1c with labs today. Continue healthy diet.

## 2015-03-31 NOTE — Progress Notes (Signed)
Pre visit review using our clinic review tool, if applicable. No additional management support is needed unless otherwise documented below in the visit note. 

## 2015-03-31 NOTE — Assessment & Plan Note (Signed)
Wt Readings from Last 3 Encounters:  03/31/15 181 lb 2 oz (82.158 kg)  03/24/15 180 lb (81.647 kg)  12/03/14 177 lb 2 oz (80.343 kg)   Body mass index is 30.14 kg/(m^2). Encouraged healthy diet and exercise.

## 2015-03-31 NOTE — Patient Instructions (Addendum)
Labs today.  We will set up eye exam.

## 2015-05-27 LAB — HM MAMMOGRAPHY: HM MAMMO: NEGATIVE

## 2015-06-02 ENCOUNTER — Encounter: Payer: Self-pay | Admitting: Internal Medicine

## 2015-06-11 ENCOUNTER — Ambulatory Visit (INDEPENDENT_AMBULATORY_CARE_PROVIDER_SITE_OTHER): Payer: BLUE CROSS/BLUE SHIELD | Admitting: Podiatry

## 2015-06-11 ENCOUNTER — Encounter: Payer: Self-pay | Admitting: Podiatry

## 2015-06-11 DIAGNOSIS — E119 Type 2 diabetes mellitus without complications: Secondary | ICD-10-CM | POA: Diagnosis not present

## 2015-06-11 NOTE — Progress Notes (Signed)
She presents today for her one-year evaluation of her bilateral diabetic foot. She denies any problems and states that her diabetes is under good control.  Objective: Vital signs are stable she is alert and oriented 3. Ulcers are strongly palpable. An neurologic along with vibratory sensation is all intact as well as per Semmes-Weinstein monofilament. Deep tendon reflexes are intact and muscle strength +5 over 5 dorsiflexion plantar flexors and inverters everters all into the musculature is intact. Orthopedic evaluation demonstrates all joints distal to the ankle have full range of motion without crepitation. Cutaneous evaluation demonstrates supple well-hydrated cutis no erythema edema cellulitis drainage or odor.  Assessment: Well diabetic foot check and out complications.  Plan: Follow up with me in 1 year.

## 2015-06-13 ENCOUNTER — Ambulatory Visit (INDEPENDENT_AMBULATORY_CARE_PROVIDER_SITE_OTHER): Payer: BLUE CROSS/BLUE SHIELD | Admitting: Pulmonary Disease

## 2015-06-13 ENCOUNTER — Encounter: Payer: Self-pay | Admitting: Pulmonary Disease

## 2015-06-13 VITALS — BP 128/76 | HR 96 | Ht 65.0 in | Wt 187.0 lb

## 2015-06-13 DIAGNOSIS — R05 Cough: Secondary | ICD-10-CM

## 2015-06-13 DIAGNOSIS — R053 Chronic cough: Secondary | ICD-10-CM

## 2015-06-13 NOTE — Patient Instructions (Signed)
Keep taking the Elavil as you are doing. We will see you back in 6 months

## 2015-06-13 NOTE — Progress Notes (Signed)
Subjective:    Patient ID: Laura Underwood, female    DOB: [birth year], 64 y.o.   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 Previously treated with gabapentin and Elavil for laryngeal sensitivity leading to cough. She had a normal simple spirometry test in 2012.  She has had a history of a negative esophageal pH probe, chest x-ray in June 2015 was normal.  HPI  Chief Complaint  Patient presents with  . Follow-up    pt c/o sinus congestion, pnd worse in morning.  denies worsening cough, chest congestion.     Laura Underwood has done very well since starting Elavil. She stopped taking gabapentin. She said initially she had some excessive sleepiness with the Elavil, but she has started taking it at 7:30 in the evening since she's doing well. Her energy level is up, the cough has almost completely gone away. She is now able to go to church without disturbing other people from the cough. No problems breathing.  Past Medical History  Diagnosis Date  . Diverticulosis   . Cough   . Shortness of breath   . Colon polyp   . Irritable bowel disease   . Abdominal pain, other specified site   . Hemorrhoids   . Stress   . Dizziness   . Headache(784.0)   . Wears glasses   . Arthritis   . Menopause   . GERD (gastroesophageal reflux disease)   . Chronic cough     s/p evaluation by allergist and pulmonologist   . Hiatal hernia   . Goiter   . Diabetes mellitus without complication ([place])   . Asthma     Dr Lake Bells     Review of Systems  Constitutional: Negative for fever, chills and fatigue.  HENT: Positive for postnasal drip. Negative for sneezing and sore throat.   Respiratory: Positive for cough. Negative for shortness of breath and wheezing.        Objective:   Physical Exam Filed Vitals:   06/13/15 1526  BP: 128/76    Pulse: 96  Height: 5\' 5"  (1.651 m)  Weight: 187 lb (84.823 kg)  SpO2: 100%    Gen: well appearing, no acute distress HEENT: NCAT, EOMi, OP clear PULM: CTA B CV: RRR, no mgr, no JVD AB: BS+, soft, nontender Ext: warm, no edema, no clubbing, no cyanosis Derm: no rash or skin breakdown Neuro: A&Ox4, MAEW      Assessment & Plan:   Chronic cough She has made dramatic headway by taking Elavil 50 mg daily at bedtime. She has not had significant side effects from this. She stopped taking the gabapentin which is reasonable. Her QTc interval was okay on her last check. I counseled her today on making sure that any new medications added to her regimen cross checked against the Elavil.  Plan: Continue Elavil 50 mg daily at bedtime Follow-up 6 months or sooner if needed    Updated Medication List Outpatient Encounter Prescriptions as of 06/13/2015  Medication Sig  . albuterol (PROVENTIL HFA;VENTOLIN HFA) 108 (90 BASE) MCG/ACT inhaler Inhale 2 puffs into the lungs every 4 (four) hours as needed for wheezing or shortness of breath.  Marland Kitchen amitriptyline (ELAVIL) 150 MG tablet Take 0.5 tablets (75 mg total) by mouth at bedtime.  Marland Kitchen aspirin 81 MG tablet Take 81 mg by mouth daily.  . cetirizine (ZYRTEC) 10 MG tablet  Take 10 mg by mouth daily.  . Cholecalciferol (VITAMIN D PO) Take by mouth daily.  Marland Kitchen docusate sodium (COLACE) 100 MG capsule Take 100 mg by mouth 2 (two) times daily.  . Fluticasone Furoate-Vilanterol (BREO ELLIPTA) 100-25 MCG/INH AEPB Inhale 1 puff into the lungs daily.  . folic acid (FOLVITE) A999333 MCG tablet Take 400 mcg by mouth daily.  . hydroxypropyl methylcellulose / hypromellose (ISOPTO TEARS / GONIOVISC) 2.5 % ophthalmic solution Place 1 drop into both eyes as needed for dry eyes.  . Misc Natural Products (TART CHERRY ADVANCED PO) Take 1,200 mg by mouth daily.  . Multiple Vitamin (MULTIVITAMIN) tablet Take 1 tablet by mouth daily.  . Omega-3 Fatty Acids (FISH OIL) 1200 MG CAPS  Take by mouth.   No facility-administered encounter medications on file as of 06/13/2015.

## 2015-06-13 NOTE — Assessment & Plan Note (Signed)
She has made dramatic headway by taking Elavil 50 mg daily at bedtime. She has not had significant side effects from this. She stopped taking the gabapentin which is reasonable. Her QTc interval was okay on her last check. I counseled her today on making sure that any new medications added to her regimen cross checked against the Elavil.  Plan: Continue Elavil 50 mg daily at bedtime Follow-up 6 months or sooner if needed

## 2015-07-07 ENCOUNTER — Encounter: Payer: Self-pay | Admitting: Internal Medicine

## 2015-07-07 ENCOUNTER — Ambulatory Visit (INDEPENDENT_AMBULATORY_CARE_PROVIDER_SITE_OTHER): Payer: BLUE CROSS/BLUE SHIELD | Admitting: Internal Medicine

## 2015-07-07 VITALS — BP 121/80 | HR 97 | Temp 98.2°F | Ht 65.0 in | Wt 187.5 lb

## 2015-07-07 DIAGNOSIS — E049 Nontoxic goiter, unspecified: Secondary | ICD-10-CM

## 2015-07-07 DIAGNOSIS — R05 Cough: Secondary | ICD-10-CM | POA: Diagnosis not present

## 2015-07-07 DIAGNOSIS — E119 Type 2 diabetes mellitus without complications: Secondary | ICD-10-CM

## 2015-07-07 DIAGNOSIS — R053 Chronic cough: Secondary | ICD-10-CM

## 2015-07-07 LAB — MICROALBUMIN / CREATININE URINE RATIO
Creatinine,U: 54.3 mg/dL
MICROALB/CREAT RATIO: 1.3 mg/g (ref 0.0–30.0)
Microalb, Ur: <0.7 mg/dL (ref 0.0–1.9)

## 2015-07-07 LAB — LIPID PANEL
CHOL/HDL RATIO: 3
Cholesterol: 169 mg/dL (ref 0–200)
HDL: 51.5 mg/dL (ref >39.00)
LDL CALC: 109 mg/dL — AB (ref 0–99)
NONHDL: 117.92
TRIGLYCERIDES: 45 mg/dL (ref 0.0–149.0)
VLDL: 9 mg/dL (ref 0.0–40.0)

## 2015-07-07 LAB — COMPREHENSIVE METABOLIC PANEL
ALK PHOS: 52 U/L (ref 39–117)
ALT: 19 U/L (ref 0–35)
AST: 20 U/L (ref 0–37)
Albumin: 4.4 g/dL (ref 3.5–5.2)
BUN: 8 mg/dL (ref 6–23)
CO2: 30 meq/L (ref 19–32)
Calcium: 9.2 mg/dL (ref 8.4–10.5)
Chloride: 102 mEq/L (ref 96–112)
Creatinine, Ser: 0.73 mg/dL (ref 0.40–1.20)
GFR: 103.37 mL/min (ref >60.00)
GLUCOSE: 99 mg/dL (ref 70–99)
POTASSIUM: 4.7 meq/L (ref 3.5–5.1)
SODIUM: 138 meq/L (ref 135–145)
Total Bilirubin: 0.3 mg/dL (ref 0.2–1.2)
Total Protein: 7.3 g/dL (ref 6.0–8.3)

## 2015-07-07 LAB — HEMOGLOBIN A1C: Hgb A1c MFr Bld: 6.2 % (ref 4.6–6.5)

## 2015-07-07 LAB — TSH: TSH: 0.34 u[IU]/mL — ABNORMAL LOW (ref 0.35–4.50)

## 2015-07-07 NOTE — Progress Notes (Signed)
Subjective:    Patient ID: Laura Underwood, female    DOB: 04-09-1951, 65 y.o.   MRN: [id number]  HPI  65YO female presents for follow up.  DM - BG running 80s-100s. Planning to get back into exercise. Notes some recent dietary indiscretion.  Chronic cough - Started on Amitryptyline by Dr. Lake Bells. Notes some improvement with this. Initially, felt groggy in mornings, but this has resolved.  Wt Readings from Last 3 Encounters:  07/07/15 187 lb 8 oz (85.049 kg)  06/13/15 187 lb (84.823 kg)  03/31/15 181 lb 2 oz (82.158 kg)   BP Readings from Last 3 Encounters:  07/07/15 121/80  06/13/15 128/76  03/31/15 119/80    Past Medical History  Diagnosis Date  . Diverticulosis   . Cough   . Shortness of breath   . Colon polyp   . Irritable bowel disease   . Abdominal pain, other specified site   . Hemorrhoids   . Stress   . Dizziness   . Headache(784.0)   . Wears glasses   . Arthritis   . Menopause   . GERD (gastroesophageal reflux disease)   . Chronic cough     s/p evaluation by allergist and pulmonologist   . Hiatal hernia   . Goiter   . Diabetes mellitus without complication ([place])   . Asthma     Dr Lake Bells   Family History  Problem Relation Age of Onset  . Stroke Mother   . Hypertension Mother   . Hyperlipidemia Mother   . Depression Mother   . Hypertension Sister   . Prostate cancer Father   . Liver cancer Father     metastatic liver  . Breast cancer Maternal Aunt   . Depression Maternal Aunt   . Depression Maternal Grandfather   . Depression Cousin   . Pancreatic cancer Paternal Aunt   . Pancreatic cancer Paternal Grandmother    Past Surgical History  Procedure Laterality Date  . Cesarean section  [phone]  . Cesarean section  [phone]  . Ovarin cyst  2004    ovary removed  . Jaw abscess  jan 2012  . Laparoscopy  1981  . Esophagogastroduodenoscopy N/A 08/15/2013    Procedure: ESOPHAGOGASTRODUODENOSCOPY (EGD);  Surgeon: Lear Ng, MD;  Location:  [facility];  Service: Endoscopy;  Laterality: N/A;  . Bravo ph study N/A 08/15/2013    Procedure: BRAVO [place];  Surgeon: Lear Ng, MD;  Location: [facility];  Service: Endoscopy;  Laterality: N/A;  . Colonoscopy N/A 08/15/2013    Procedure: COLONOSCOPY;  Surgeon: Lear Ng, MD;  Location: [facility];  Service: Endoscopy;  Laterality: N/A;  . Cholecystectomy  09/13/14   Social History   Social History  . Marital Status: Married    Spouse Name: N/A  . Number of Children: Y  . Years of Education: N/A   Occupational History  . RN, [employer], coder,office mgr.    Social History Main Topics  . Smoking status: Never Smoker   . Smokeless tobacco: Never Used  . Alcohol Use: 0.0 oz/week    1-2 Cans of beer per week     Comment: rarely  . Drug Use: No  . Sexual Activity: Not Asked   Other Topics Concern  . None   Social History Narrative   Lives in [place]. 3 children, daughters live nearby, son lives in [place].      Work - Ecologist, Therapist, sports      Diet -  healthy, limited caffeine    Review of Systems  Constitutional: Negative for fever, chills, appetite change, fatigue and unexpected weight change.  Eyes: Negative for visual disturbance.  Respiratory: Positive for cough. Negative for chest tightness, shortness of breath and wheezing.   Cardiovascular: Negative for chest pain and leg swelling.  Gastrointestinal: Negative for nausea, vomiting, abdominal pain, diarrhea and constipation.  Musculoskeletal: Negative for myalgias and arthralgias.  Skin: Negative for color change and rash.  Hematological: Negative for adenopathy. Does not bruise/bleed easily.  Psychiatric/Behavioral: Negative for sleep disturbance and dysphoric mood. The patient is not nervous/anxious.        Objective:    BP 121/80 mmHg  Pulse 97  Temp(Src) 98.2 F (36.8 C)  Ht 5\' 5"  (1.651 m)  Wt 187 lb 8 oz (85.049 kg)  BMI 31.20 kg/m2  SpO2 98% Physical  Exam  Constitutional: She is oriented to person, place, and time. She appears well-developed and well-nourished. No distress.  HENT:  Head: Normocephalic and atraumatic.  Right Ear: External ear normal.  Left Ear: External ear normal.  Nose: Nose normal.  Mouth/Throat: Oropharynx is clear and moist. No oropharyngeal exudate.  Eyes: Conjunctivae are normal. Pupils are equal, round, and reactive to light. Right eye exhibits no discharge. Left eye exhibits no discharge. No scleral icterus.  Neck: Normal range of motion. Neck supple. No tracheal deviation present. No thyromegaly present.  Cardiovascular: Normal rate, regular rhythm, normal heart sounds and intact distal pulses.  Exam reveals no gallop and no friction rub.   No murmur heard. Pulmonary/Chest: Effort normal and breath sounds normal. No respiratory distress. She has no wheezes. She has no rales. She exhibits no tenderness.  Musculoskeletal: Normal range of motion. She exhibits no edema or tenderness.  Lymphadenopathy:    She has no cervical adenopathy.  Neurological: She is alert and oriented to person, place, and time. No cranial nerve deficit. She exhibits normal muscle tone. Coordination normal.  Skin: Skin is warm and dry. No rash noted. She is not diaphoretic. No erythema. No pallor.  Psychiatric: She has a normal mood and affect. Her behavior is normal. Judgment and thought content normal.          Assessment & Plan:   Problem List Items Addressed This Visit      Unprioritized   Chronic cough    Chronic cough. Followed by pulmonary. Some improvement noted with addition of Amitriptyline. Will continue to monitor.      Diabetes mellitus type 2, controlled ([place]) - Primary    BG well controlled by her report. Will check A1c with labs today.      Relevant Orders   Comprehensive metabolic panel   Hemoglobin A1c   Lipid panel   Microalbumin / creatinine urine ratio   Goiter    Will check TSH with labs today.        Relevant Orders   TSH       Return in about 3 months (around 10/05/2015) for Recheck of Diabetes.

## 2015-07-07 NOTE — Assessment & Plan Note (Signed)
Chronic cough. Followed by pulmonary. Some improvement noted with addition of Amitriptyline. Will continue to monitor.

## 2015-07-07 NOTE — Patient Instructions (Signed)
Labs today.  Follow up 3 months. 

## 2015-07-07 NOTE — Assessment & Plan Note (Signed)
Will check TSH with labs today. 

## 2015-07-07 NOTE — Progress Notes (Signed)
Pre visit review using our clinic review tool, if applicable. No additional management support is needed unless otherwise documented below in the visit note. 

## 2015-07-07 NOTE — Assessment & Plan Note (Signed)
BG well controlled by her report. Will check A1c with labs today.

## 2015-07-11 LAB — HM DIABETES EYE EXAM

## 2015-08-18 ENCOUNTER — Encounter: Payer: Self-pay | Admitting: *Deleted

## 2015-10-06 ENCOUNTER — Encounter: Payer: Self-pay | Admitting: Internal Medicine

## 2015-10-06 ENCOUNTER — Ambulatory Visit (INDEPENDENT_AMBULATORY_CARE_PROVIDER_SITE_OTHER): Payer: BLUE CROSS/BLUE SHIELD | Admitting: Internal Medicine

## 2015-10-06 VITALS — BP 120/78 | HR 97 | Ht 65.0 in | Wt 194.4 lb

## 2015-10-06 DIAGNOSIS — R5382 Chronic fatigue, unspecified: Secondary | ICD-10-CM

## 2015-10-06 DIAGNOSIS — E041 Nontoxic single thyroid nodule: Secondary | ICD-10-CM

## 2015-10-06 DIAGNOSIS — E669 Obesity, unspecified: Secondary | ICD-10-CM | POA: Diagnosis not present

## 2015-10-06 DIAGNOSIS — E119 Type 2 diabetes mellitus without complications: Secondary | ICD-10-CM

## 2015-10-06 LAB — COMPREHENSIVE METABOLIC PANEL
ALK PHOS: 42 U/L (ref 39–117)
ALT: 27 U/L (ref 0–35)
AST: 23 U/L (ref 0–37)
Albumin: 4.3 g/dL (ref 3.5–5.2)
BILIRUBIN TOTAL: 0.3 mg/dL (ref 0.2–1.2)
BUN: 13 mg/dL (ref 6–23)
CALCIUM: 9.5 mg/dL (ref 8.4–10.5)
CO2: 29 mEq/L (ref 19–32)
Chloride: 104 mEq/L (ref 96–112)
Creatinine, Ser: 0.7 mg/dL (ref 0.40–1.20)
GFR: 108.41 mL/min (ref >60.00)
Glucose, Bld: 103 mg/dL — ABNORMAL HIGH (ref 70–99)
Potassium: 4.5 mEq/L (ref 3.5–5.1)
Sodium: 140 mEq/L (ref 135–145)
TOTAL PROTEIN: 7.2 g/dL (ref 6.0–8.3)

## 2015-10-06 LAB — LIPID PANEL
CHOLESTEROL: 192 mg/dL (ref 0–200)
HDL: 49 mg/dL (ref >39.00)
LDL Cholesterol: 133 mg/dL — ABNORMAL HIGH (ref 0–99)
NonHDL: 142.74
TRIGLYCERIDES: 50 mg/dL (ref 0.0–149.0)
Total CHOL/HDL Ratio: 4
VLDL: 10 mg/dL (ref 0.0–40.0)

## 2015-10-06 LAB — MICROALBUMIN / CREATININE URINE RATIO
CREATININE, U: 115.2 mg/dL
MICROALB/CREAT RATIO: 0.7 mg/g (ref 0.0–30.0)
Microalb, Ur: 0.8 mg/dL (ref 0.0–1.9)

## 2015-10-06 LAB — HEMOGLOBIN A1C: HEMOGLOBIN A1C: 6.7 % — AB (ref 4.6–6.5)

## 2015-10-06 LAB — VITAMIN B12: VITAMIN B 12: 509 pg/mL (ref 211–911)

## 2015-10-06 LAB — TSH: TSH: 0.41 u[IU]/mL (ref 0.35–4.50)

## 2015-10-06 NOTE — Patient Instructions (Signed)
Labs today.   Follow up in 3 months.  

## 2015-10-06 NOTE — Assessment & Plan Note (Signed)
Several months of fatigue. Likely related to sleep habits and staying up very late at night. Encouraged good sleep hygiene. Will also recheck TSH and B12.

## 2015-10-06 NOTE — Progress Notes (Signed)
Subjective:    Patient ID: Laura Underwood, female    DOB: 13-May-1951, 65 y.o.   MRN: [id number]  HPI  65YO female presents for follow up.  DM - BG mostly near 100. Notes some dietary indiscretion. Working on this and increasing physical activity.  Notes some fatigue over last few months. No focal symptoms. Notes she goes to bed very late. Stay up reading books. Wakes up about 6:30am.   Lab Results  Component Value Date   HGBA1C 6.2 07/07/2015      Wt Readings from Last 3 Encounters:  10/06/15 194 lb 6.4 oz (88.179 kg)  07/07/15 187 lb 8 oz (85.049 kg)  06/13/15 187 lb (84.823 kg)   BP Readings from Last 3 Encounters:  10/06/15 120/78  07/07/15 121/80  06/13/15 128/76    Past Medical History  Diagnosis Date  . Diverticulosis   . Cough   . Shortness of breath   . Colon polyp   . Irritable bowel disease   . Abdominal pain, other specified site   . Hemorrhoids   . Stress   . Dizziness   . Headache(784.0)   . Wears glasses   . Arthritis   . Menopause   . GERD (gastroesophageal reflux disease)   . Chronic cough     s/p evaluation by allergist and pulmonologist   . Hiatal hernia   . Goiter   . Diabetes mellitus without complication ([place])   . Asthma     Dr Lake Bells   Family History  Problem Relation Age of Onset  . Stroke Mother   . Hypertension Mother   . Hyperlipidemia Mother   . Depression Mother   . Hypertension Sister   . Prostate cancer Father   . Liver cancer Father     metastatic liver  . Breast cancer Maternal Aunt   . Depression Maternal Aunt   . Depression Maternal Grandfather   . Depression Cousin   . Pancreatic cancer Paternal Aunt   . Pancreatic cancer Paternal Grandmother    Past Surgical History  Procedure Laterality Date  . Cesarean section  [phone]  . Cesarean section  [phone]  . Ovarin cyst  2004    ovary removed  . Jaw abscess  jan 2012  . Laparoscopy  1981  . Esophagogastroduodenoscopy N/A 08/15/2013    Procedure:  ESOPHAGOGASTRODUODENOSCOPY (EGD);  Surgeon: Lear Ng, MD;  Location: [facility];  Service: Endoscopy;  Laterality: N/A;  . Bravo ph study N/A 08/15/2013    Procedure: BRAVO [place];  Surgeon: Lear Ng, MD;  Location: [facility];  Service: Endoscopy;  Laterality: N/A;  . Colonoscopy N/A 08/15/2013    Procedure: COLONOSCOPY;  Surgeon: Lear Ng, MD;  Location: [facility];  Service: Endoscopy;  Laterality: N/A;  . Cholecystectomy  09/13/14   Social History   Social History  . Marital Status: Married    Spouse Name: N/A  . Number of Children: Y  . Years of Education: N/A   Occupational History  . RN, [employer], coder,office mgr.    Social History Main Topics  . Smoking status: Never Smoker   . Smokeless tobacco: Never Used  . Alcohol Use: 0.0 oz/week    1-2 Cans of beer per week     Comment: rarely  . Drug Use: No  . Sexual Activity: Not Asked   Other Topics Concern  . None   Social History Narrative   Lives in [place]. 3 children, daughters live nearby, son lives in  [place].      Work - Research officer, political party and coding, Therapist, sports      Diet - healthy, limited caffeine    Review of Systems  Constitutional: Positive for fatigue. Negative for fever, chills, appetite change and unexpected weight change.  Eyes: Negative for visual disturbance.  Respiratory: Negative for shortness of breath.   Cardiovascular: Negative for chest pain and leg swelling.  Gastrointestinal: Negative for nausea, vomiting, abdominal pain, diarrhea and constipation.  Musculoskeletal: Negative for myalgias and arthralgias.  Skin: Negative for color change and rash.  Hematological: Negative for adenopathy. Does not bruise/bleed easily.  Psychiatric/Behavioral: Negative for sleep disturbance and dysphoric mood. The patient is not nervous/anxious.        Objective:    BP 120/78 mmHg  Pulse 97  Ht 5\' 5"  (1.651 m)  Wt 194 lb 6.4 oz (88.179 kg)  BMI 32.35 kg/m2  SpO2  98% Physical Exam  Constitutional: She is oriented to person, place, and time. She appears well-developed and well-nourished. No distress.  HENT:  Head: Normocephalic and atraumatic.  Right Ear: External ear normal.  Left Ear: External ear normal.  Nose: Nose normal.  Mouth/Throat: Oropharynx is clear and moist. No oropharyngeal exudate.  Eyes: Conjunctivae are normal. Pupils are equal, round, and reactive to light. Right eye exhibits no discharge. Left eye exhibits no discharge. No scleral icterus.  Neck: Normal range of motion. Neck supple. No tracheal deviation present. No thyromegaly present.  Cardiovascular: Normal rate, regular rhythm, normal heart sounds and intact distal pulses.  Exam reveals no gallop and no friction rub.   No murmur heard. Pulmonary/Chest: Effort normal and breath sounds normal. No respiratory distress. She has no wheezes. She has no rales. She exhibits no tenderness.  Musculoskeletal: Normal range of motion. She exhibits no edema or tenderness.  Lymphadenopathy:    She has no cervical adenopathy.  Neurological: She is alert and oriented to person, place, and time. No cranial nerve deficit. She exhibits normal muscle tone. Coordination normal.  Skin: Skin is warm and dry. No rash noted. She is not diaphoretic. No erythema. No pallor.  Psychiatric: She has a normal mood and affect. Her behavior is normal. Judgment and thought content normal.          Assessment & Plan:   Problem List Items Addressed This Visit      Unprioritized   Chronic fatigue    Several months of fatigue. Likely related to sleep habits and staying up very late at night. Encouraged good sleep hygiene. Will also recheck TSH and B12.      Relevant Orders   B12   Diabetes mellitus type 2, controlled ([place]) - Primary    Will check A1c with labs. Continue healthy diet and exercise.      Relevant Orders   Comprehensive metabolic panel   Hemoglobin A1c   Lipid panel   Microalbumin /  creatinine urine ratio   Obesity (BMI 30-39.9)    Wt Readings from Last 3 Encounters:  10/06/15 194 lb 6.4 oz (88.179 kg)  07/07/15 187 lb 8 oz (85.049 kg)  06/13/15 187 lb (84.823 kg)   Encouraged healthy diet and exercise.      Thyroid nodule    Will recheck TSH with labs today given recent fatigue.      Relevant Orders   TSH       Return in about 3 months (around 01/05/2016) for Recheck.  Ronette Deter, MD [facility]

## 2015-10-06 NOTE — Assessment & Plan Note (Signed)
Will recheck TSH with labs today given recent fatigue.

## 2015-10-06 NOTE — Assessment & Plan Note (Signed)
Wt Readings from Last 3 Encounters:  10/06/15 194 lb 6.4 oz (88.179 kg)  07/07/15 187 lb 8 oz (85.049 kg)  06/13/15 187 lb (84.823 kg)   Encouraged healthy diet and exercise.

## 2015-10-06 NOTE — Assessment & Plan Note (Signed)
Will check A1c with labs. Continue healthy diet and exercise. 

## 2015-11-13 ENCOUNTER — Other Ambulatory Visit: Payer: Self-pay | Admitting: Pulmonary Disease

## 2015-12-01 ENCOUNTER — Encounter: Payer: Self-pay | Admitting: Pulmonary Disease

## 2015-12-01 ENCOUNTER — Ambulatory Visit (INDEPENDENT_AMBULATORY_CARE_PROVIDER_SITE_OTHER): Payer: BLUE CROSS/BLUE SHIELD | Admitting: Pulmonary Disease

## 2015-12-01 VITALS — BP 124/80 | HR 108 | Temp 98.1°F | Ht 65.0 in | Wt 198.0 lb

## 2015-12-01 DIAGNOSIS — R05 Cough: Secondary | ICD-10-CM | POA: Diagnosis not present

## 2015-12-01 DIAGNOSIS — R053 Chronic cough: Secondary | ICD-10-CM

## 2015-12-01 DIAGNOSIS — Z5181 Encounter for therapeutic drug level monitoring: Secondary | ICD-10-CM | POA: Diagnosis not present

## 2015-12-01 NOTE — Assessment & Plan Note (Signed)
  Chronic cough is manageable with current treatment Plan It is nice to meet you today Continue using your Breo once daily. Continue using your Ventolin as needed. Continue your 75 mg Amitriptyline at bedtime. EKG today  It is fine stop Zyrtec and start Xyzal once daily. Follow up with Dr. Lake Bells in 6 months. Please contact office for sooner follow up if symptoms do not improve or worsen or seek emergency care

## 2015-12-01 NOTE — Addendum Note (Signed)
Addended by: Len Blalock on: 12/01/2015 02:34 PM   Modules accepted: Orders

## 2015-12-01 NOTE — Progress Notes (Signed)
History of Present Illness Laura Underwood is a 65 y.o. female with chronic cough with upper airway cough syndrome followed by Dr. Lake Bells.  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 Previously treated with gabapentin and Elavil for laryngeal sensitivity leading to cough. She had a normal simple spirometry test in 2012. She has had a history of a negative esophageal pH probe, chest x-ray in June 2015 was normal.  6/19/2017Follow Up OV: The patient states that she is doing about the same. She still has coughing spell about one time daily, but her ventolin inhaler is helping a lot. She is also using her ice water She is compliant with her Elavil 75 mg which she continues to take at night and her Breo once daily. She knows to rinse her mouth after use. She states that this is a manageable situation. She is currently talkng her Zyrtec, but would like to change to Xyzal. She wants to check with Dr. Lake Bells  to make sure this is ok.   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   Past medical hx Past Medical History  Diagnosis Date  . Diverticulosis   . Cough   . Shortness of breath   . Colon polyp   . Irritable bowel disease   . Abdominal pain, other specified site   . Hemorrhoids   . Stress   . Dizziness   . Headache(784.0)   . Wears glasses   . Arthritis   . Menopause   . GERD (gastroesophageal reflux disease)   . Chronic cough     s/p evaluation by allergist and pulmonologist   . Hiatal hernia   . Goiter   . Diabetes mellitus without complication ([place])   . Asthma    Dr Lake Bells     Past surgical hx, Family hx, Social hx all reviewed.  Current Outpatient Prescriptions on File Prior to Visit  Medication Sig  . albuterol (PROVENTIL HFA;VENTOLIN HFA) 108 (90 BASE) MCG/ACT inhaler Inhale 2 puffs into the lungs every 4 (four) hours as needed for wheezing or shortness of breath.  Marland Kitchen amitriptyline (ELAVIL) 150 MG tablet Take 0.5 tablets (75 mg total) by mouth at bedtime.  Marland Kitchen aspirin 81 MG tablet Take 81 mg by mouth daily.  . cetirizine (ZYRTEC) 10 MG tablet Take 10 mg by mouth daily.  . Cholecalciferol (VITAMIN D PO) Take by mouth daily.  Marland Kitchen docusate sodium (COLACE) 100 MG capsule Take 100 mg by mouth 2 (two) times daily.  . Fluticasone Furoate-Vilanterol (BREO ELLIPTA) 100-25 MCG/INH AEPB Inhale 1 puff into the lungs daily.  . folic acid (FOLVITE) A999333 MCG tablet Take 400 mcg by mouth daily.  . hydroxypropyl methylcellulose / hypromellose (ISOPTO TEARS / GONIOVISC) 2.5 % ophthalmic solution Place 1 drop into both eyes as needed for dry eyes.  . Misc Natural Products (TART CHERRY ADVANCED PO) Take 1,200 mg by mouth daily.  . Multiple Vitamin (MULTIVITAMIN) tablet Take 1 tablet by mouth daily.  . Omega-3 Fatty Acids (FISH OIL) 1200 MG CAPS Take by mouth.  . TURMERIC PO Take by mouth.   No current facility-administered medications on file prior to visit.  Allergies  Allergen Reactions  . Latex   . Nickel   . Neosporin [Neomycin-Bacitracin Zn-Polymyx] Rash    Review Of Systems:  Constitutional:   No  weight loss, night sweats,  Fevers, chills, fatigue, or  lassitude.  HEENT:   No headaches,  Difficulty swallowing,  Tooth/dental problems, or  Sore throat,                No sneezing, itching, ear ache, nasal congestion, post nasal drip,   CV:  No chest pain,  Orthopnea, PND, swelling in lower extremities, anasarca, dizziness, palpitations, syncope.   GI  No heartburn, indigestion, abdominal pain, nausea, vomiting, diarrhea, change in bowel habits, loss  of appetite, bloody stools.   Resp: No shortness of breath with exertion or at rest.  No excess mucus, no productive cough,  + non-productive cough,  No coughing up of blood.  No change in color of mucus.  No wheezing.  No chest wall deformity  Skin: no rash or lesions.  GU: no dysuria, change in color of urine, no urgency or frequency.  No flank pain, no hematuria   MS:  No joint pain or swelling.  No decreased range of motion.  No back pain.  Psych:  No change in mood or affect. No depression or anxiety.  No memory loss.   Vital Signs BP 124/80 mmHg  Pulse 108  Temp(Src) 98.1 F (36.7 C) (Oral)  Ht 5\' 5"  (1.651 m)  Wt 198 lb (89.812 kg)  BMI 32.95 kg/m2  SpO2 100%   Physical Exam:  General- No distress,  A&Ox3, pleasant ENT: No sinus tenderness, TM clear, pale nasal mucosa, no oral exudate,no post nasal drip, no LAN Cardiac: S1, S2, regular rate and rhythm, no murmur Chest: No wheeze/ rales/ dullness; no accessory muscle use, no nasal flaring, no sternal retractions Abd.: Soft Non-tender Ext: No clubbing cyanosis, edema Neuro:  normal strength Skin: No rashes, warm and dry Psych: normal mood and behavior   Assessment/Plan  Chronic cough  Chronic cough is manageable with current treatment Plan It is nice to meet you today Continue using your Breo once daily. Continue using your Ventolin as needed. Continue your 75 mg Amitriptyline at bedtime. EKG today  It is fine stop Zyrtec and start Xyzal once daily. Follow up with Dr. Lake Bells in 6 months. Please contact office for sooner follow up if symptoms do not improve or worsen or seek emergency care       Magdalen Spatz, NP 12/01/2015  2:11 PM    Attending:  I have seen and examined the patient with nurse practitioner/resident and agree with the note above.  We formulated the plan together and I elicited the following history.    Laura Underwood has been doing well. Her cough is manageable. She still has  coughing spells every now and then. No asthma attacks.  On exam: Lungs with normal air movement, no wheezing, few crackles bases bilaterally Cardiovascular regular rate and rhythm no murmurs gallops or rubs  Chronic cough: Continue upper airway laryngeal irritation treatment with Elavil daily, check QTC today Asthma: Stable, continue Breo  Follow-up 6 months or sooner if needed  Roselie Awkward, MD [facility] Pager: [phone] Cell: [phone] After 3pm or if no response, call [phone]

## 2015-12-01 NOTE — Patient Instructions (Addendum)
It is nice to meet you today Continue using your Breo once daily. Continue using your Ventolin as needed. Continue your 75 mg Amitriptyline at bedtime. EKG today  It is fine stop Zyrtec and start Xyzal once daily. Follow up with Dr. Lake Bells in 6 months. Please contact office for sooner follow up if symptoms do not improve or worsen or seek emergency care

## 2015-12-05 ENCOUNTER — Telehealth: Payer: Self-pay

## 2015-12-05 NOTE — Telephone Encounter (Signed)
-----   Message from Juanito Doom, MD sent at 12/03/2015  4:48 PM EDT ----- A Please let her know that her EKG was OK Thanks B

## 2015-12-05 NOTE — Telephone Encounter (Signed)
Spoke with pt, aware of results/recs.  Nothing further needed.  

## 2016-01-05 ENCOUNTER — Ambulatory Visit: Payer: BLUE CROSS/BLUE SHIELD | Admitting: Internal Medicine

## 2016-01-13 ENCOUNTER — Encounter: Payer: Self-pay | Admitting: Family

## 2016-01-13 ENCOUNTER — Ambulatory Visit (INDEPENDENT_AMBULATORY_CARE_PROVIDER_SITE_OTHER): Payer: BLUE CROSS/BLUE SHIELD | Admitting: Family

## 2016-01-13 VITALS — BP 108/80 | HR 57 | Wt 196.0 lb

## 2016-01-13 DIAGNOSIS — E119 Type 2 diabetes mellitus without complications: Secondary | ICD-10-CM | POA: Diagnosis not present

## 2016-01-13 DIAGNOSIS — L0291 Cutaneous abscess, unspecified: Secondary | ICD-10-CM | POA: Insufficient documentation

## 2016-01-13 LAB — HEMOGLOBIN A1C: HEMOGLOBIN A1C: 6.4 % (ref 4.6–6.5)

## 2016-01-13 MED ORDER — MUPIROCIN CALCIUM 2 % EX CREA: 1.0000 "application " | TOPICAL_CREAM | Freq: Three times a day (TID) | CUTANEOUS | 0 refills | Status: DC

## 2016-01-13 NOTE — Progress Notes (Signed)
Subjective:    Patient ID: Laura Underwood, female    DOB: 08/30/1950, 65 y.o.   MRN: [id number]  CC: Laura Underwood is a 65 y.o. female who presents today for follow up.   HPI: Patient here for follow-up for diabetes. Checking BS in morning and fasting BS 110 on average. Eating healthy, excercising. Last A1C 6.7%. States noticed numbness, tingling in right second toe and under great toe over past month.   She also has complaint of a 'cyst' one month ago noted between breasts. Draining scant blood. No h/o MRSA. No fever, chills.     HISTORY:  Past Medical History:  Diagnosis Date  . Abdominal pain, other specified site   . Arthritis   . Asthma    Dr Lake Bells  . Chronic cough    s/p evaluation by allergist and pulmonologist   . Colon polyp   . Cough   . Diabetes mellitus without complication ([place])   . Diverticulosis   . Dizziness   . GERD (gastroesophageal reflux disease)   . Goiter   . Headache(784.0)   . Hemorrhoids   . Hiatal hernia   . Irritable bowel disease   . Menopause   . Shortness of breath   . Stress   . Wears glasses    Past Surgical History:  Procedure Laterality Date  . BRAVO [place] STUDY N/A 08/15/2013   Procedure: BRAVO [place] STUDY;  Surgeon: Lear Ng, MD;  Location: [facility];  Service: Endoscopy;  Laterality: N/A;  . CESAREAN SECTION  [phone]  . CESAREAN SECTION  [phone]  . CHOLECYSTECTOMY  09/13/14  . COLONOSCOPY N/A 08/15/2013   Procedure: COLONOSCOPY;  Surgeon: Lear Ng, MD;  Location: [facility];  Service: Endoscopy;  Laterality: N/A;  . ESOPHAGOGASTRODUODENOSCOPY N/A 08/15/2013   Procedure: ESOPHAGOGASTRODUODENOSCOPY (EGD);  Surgeon: Lear Ng, MD;  Location: [facility];  Service: Endoscopy;  Laterality: N/A;  . jaw abscess  jan 2012  . LAPAROSCOPY  1981  . ovarin cyst  2004   ovary removed   Family History  Problem Relation Age of Onset  . Stroke Mother   . Hypertension Mother   . Hyperlipidemia Mother   .  Depression Mother   . Hypertension Sister   . Prostate cancer Father   . Liver cancer Father     metastatic liver  . Breast cancer Maternal Aunt   . Depression Maternal Aunt   . Depression Maternal Grandfather   . Depression Cousin   . Pancreatic cancer Paternal Aunt   . Pancreatic cancer Paternal Grandmother     Allergies: Latex; Nickel; Benzalkonium chloride; and Neosporin [neomycin-bacitracin zn-polymyx] Current Outpatient Prescriptions on File Prior to Visit  Medication Sig Dispense Refill  . albuterol (PROVENTIL HFA;VENTOLIN HFA) 108 (90 BASE) MCG/ACT inhaler Inhale 2 puffs into the lungs every 4 (four) hours as needed for wheezing or shortness of breath. 1 Inhaler 6  . amitriptyline (ELAVIL) 150 MG tablet Take 0.5 tablets (75 mg total) by mouth at bedtime. 30 tablet 5  . aspirin 81 MG tablet Take 81 mg by mouth daily.    . Cholecalciferol (VITAMIN D PO) Take by mouth daily.    . Cinnamon 500 MG capsule Take 500 mg by mouth daily.    Marland Kitchen docusate sodium (COLACE) 100 MG capsule Take 100 mg by mouth 2 (two) times daily.    . Fluticasone Furoate-Vilanterol (BREO ELLIPTA) 100-25 MCG/INH AEPB Inhale 1 puff into the lungs daily. 60 each 5  . folic acid (  FOLVITE) 400 MCG tablet Take 400 mcg by mouth daily.    . hydroxypropyl methylcellulose / hypromellose (ISOPTO TEARS / GONIOVISC) 2.5 % ophthalmic solution Place 1 drop into both eyes as needed for dry eyes.    . Misc Natural Products (TART CHERRY ADVANCED PO) Take 1,200 mg by mouth daily.    . Multiple Vitamin (MULTIVITAMIN) tablet Take 1 tablet by mouth daily.    . Omega-3 Fatty Acids (FISH OIL) 1200 MG CAPS Take by mouth.    . TURMERIC PO Take by mouth.     No current facility-administered medications on file prior to visit.     Social History  Substance Use Topics  . Smoking status: Never Smoker  . Smokeless tobacco: Never Used  . Alcohol use 0.0 oz/week    1 - 2 Cans of beer per week     Comment: rarely    Review of Systems   Constitutional: Negative for chills and fever.  Respiratory: Negative for cough.   Cardiovascular: Negative for chest pain and palpitations.  Gastrointestinal: Negative for nausea and vomiting.  Endocrine: Negative for polydipsia, polyphagia and polyuria.  Genitourinary: Negative for dysuria.  Skin: Positive for wound.      Objective:    BP 108/80   Pulse (!) 57   Wt 196 lb (88.9 kg)   SpO2 96%   BMI 32.62 kg/m    Physical Exam  Constitutional: She appears well-developed and well-nourished.  Eyes: Conjunctivae are normal.  Cardiovascular: Normal rate, regular rhythm, normal heart sounds and normal pulses.   Palpable pedal pulses. No erythema, swelling.   Pulmonary/Chest: Effort normal and breath sounds normal. She has no wheezes. She has no rhonchi. She has no rales.  Neurological: She is alert.  Sensation intact bilateral LE and toes.   Skin: Skin is warm and dry.  2cm nodule between breast; fluctuant and able to express sebaceous material. No increase in warmth, surrounding skin intact.  Psychiatric: She has a normal mood and affect. Her speech is normal and behavior is normal. Thought content normal.  Vitals reviewed.      Assessment & Plan:   Problem List Items Addressed This Visit      Endocrine   Diabetes mellitus type 2, controlled ([place]) - Primary    Pleased with patient's fasting BS and adherence to diet, exercise. Checking A1C today. We'll follow numbness and tingling in right toes. B12 normal. May be idopathic or related to prediabetes.       Relevant Orders   Hemoglobin A1c     Other   Abscess    Wound culture. Topical abx, warm compresses.       Relevant Medications   mupirocin cream (BACTROBAN) 2 %   Other Relevant Orders   Wound culture    Other Visit Diagnoses   None.        I have discontinued Ms. Lile's cetirizine. I am also having her start on mupirocin cream. Additionally, I am having her maintain her Fish Oil, multivitamin,  folic acid, Misc Natural Products (TART CHERRY ADVANCED PO), aspirin, docusate sodium, Cholecalciferol (VITAMIN D PO), hydroxypropyl methylcellulose / hypromellose, amitriptyline, albuterol, fluticasone furoate-vilanterol, TURMERIC PO, and Cinnamon.   Meds ordered this encounter  Medications  . mupirocin cream (BACTROBAN) 2 %    Sig: Apply 1 application topically 3 (three) times daily.    Dispense:  15 g    Refill:  0    Order Specific Question:   Supervising Provider    Answer:   Alain [place],  ALEKSEI V [[id number]]    Return precautions given.   Risks, benefits, and alternatives of the medications and treatment plan prescribed today were discussed, and patient expressed understanding.   Education regarding symptom management and diagnosis given to patient on AVS.  Continue to follow with Mable Paris, FNP for routine health maintenance.   Laura Underwood and I agreed with plan.   Mable Paris, FNP

## 2016-01-13 NOTE — Patient Instructions (Signed)
Pleasure meeting you.   Great work on diabetes.   If there is no improvement in your symptoms, or if there is any worsening of symptoms, or if you have any additional concerns, please return for re-evaluation; or, if we are closed, consider going to the Emergency Room for evaluation if symptoms urgent.

## 2016-01-13 NOTE — Assessment & Plan Note (Addendum)
Pleased with patient's fasting BS and adherence to diet, exercise. Checking A1C today. We'll follow numbness and tingling in right toes. B12 normal. May be idopathic or related to prediabetes.

## 2016-01-13 NOTE — Assessment & Plan Note (Addendum)
Wound culture. Topical abx, warm compresses.

## 2016-01-16 LAB — WOUND CULTURE
GRAM STAIN: NONE SEEN
GRAM STAIN: NONE SEEN

## 2016-02-09 ENCOUNTER — Other Ambulatory Visit: Payer: Self-pay | Admitting: Pulmonary Disease

## 2016-03-10 ENCOUNTER — Other Ambulatory Visit: Payer: Self-pay | Admitting: Pulmonary Disease

## 2016-04-14 ENCOUNTER — Encounter: Payer: Self-pay | Admitting: Family

## 2016-04-14 ENCOUNTER — Ambulatory Visit (INDEPENDENT_AMBULATORY_CARE_PROVIDER_SITE_OTHER): Payer: BLUE CROSS/BLUE SHIELD | Admitting: Family

## 2016-04-14 ENCOUNTER — Other Ambulatory Visit (HOSPITAL_COMMUNITY)
Admission: RE | Admit: 2016-04-14 | Discharge: 2016-04-14 | Disposition: A | Discharge status: Home / Self Care | Payer: BLUE CROSS/BLUE SHIELD | Source: Ambulatory Visit | Attending: Family | Admitting: Family

## 2016-04-14 VITALS — BP 132/78 | HR 90 | Temp 98.4°F | Ht 65.0 in | Wt 197.6 lb

## 2016-04-14 DIAGNOSIS — Z01419 Encounter for gynecological examination (general) (routine) without abnormal findings: Secondary | ICD-10-CM | POA: Insufficient documentation

## 2016-04-14 DIAGNOSIS — Z1151 Encounter for screening for human papillomavirus (HPV): Secondary | ICD-10-CM | POA: Insufficient documentation

## 2016-04-14 DIAGNOSIS — K219 Gastro-esophageal reflux disease without esophagitis: Secondary | ICD-10-CM | POA: Diagnosis not present

## 2016-04-14 DIAGNOSIS — E042 Nontoxic multinodular goiter: Secondary | ICD-10-CM | POA: Diagnosis not present

## 2016-04-14 DIAGNOSIS — E119 Type 2 diabetes mellitus without complications: Secondary | ICD-10-CM

## 2016-04-14 DIAGNOSIS — Z0000 Encounter for general adult medical examination without abnormal findings: Secondary | ICD-10-CM | POA: Diagnosis not present

## 2016-04-14 LAB — HEMOGLOBIN A1C: Hgb A1c MFr Bld: 6.6 % — ABNORMAL HIGH (ref 4.6–6.5)

## 2016-04-14 LAB — TSH: TSH: 0.49 u[IU]/mL (ref 0.35–4.50)

## 2016-04-14 NOTE — Assessment & Plan Note (Signed)
No masses felt on thyroid exam. Pending TSH. We'll continue to follow.

## 2016-04-14 NOTE — Assessment & Plan Note (Signed)
Up-to-date on colonoscopy. Due for repeat colonoscopy 2020. Mammogram due and patient will schedule this in December of this year. Pelvic and Pap smear done today. Deferred DEXA scanning at this time. Patient is not a smoker. Up-to-date on immunizations. Screening labs which were not included earlier this year have been ordered today including hepatitis C. Encouraged exercise and healthy diet.

## 2016-04-14 NOTE — Assessment & Plan Note (Signed)
Stable. Controlled. We'll continue to follow.

## 2016-04-14 NOTE — Assessment & Plan Note (Signed)
Diet controlled. Pending A1c

## 2016-04-14 NOTE — Progress Notes (Signed)
Pre visit review using our clinic review tool, if applicable. No additional management support is needed unless otherwise documented below in the visit note. 

## 2016-04-14 NOTE — Patient Instructions (Signed)
Pleasure seeing you today. Look forward to seeing you in follow-up.

## 2016-04-14 NOTE — Progress Notes (Signed)
Subjective:    Patient ID: Laura Underwood, female    DOB: Sep 18, 1950, 65 y.o.   MRN: [id number]  CC: SURAIYA Underwood is a 65 y.o. female who presents today for physical exam.    HPI: Patient here for physical. Feeling well. No complaints.   Diabetes- last A1C 6.4. Managed with diet alone.   Chronic cough/ asthma- Stable. Cough is throughout day and night. Not worse with exercise. Follows with Dr Pennie Banter, on amitriptyline, breo.   Goiter - TSH 0.4 09/2015. Had biopsy over a year ago per patient and was normal. No dysphagia, hoarseness.   GERD- Stable. Occasional. Takes PRN maalox.      Colorectal Cancer Screening: UTD polyps,  From 2015; repeat 2020. Breast Cancer Screening: Mammogram Due. Cervical Cancer Screening: Due.  Bone Health screening/DEXA for 65+: No increased fracture risk. Defer screening at this time.  Lung Cancer Screening: Doesn't have 30 year pack year history and age > 23 years.  Immunizations       Tetanus - UTD        Pneumococcal - Done Hepatitis C screening - Candidate for. Labs: Screening labs today which were not including 06/2015. Exercise: Gets regular exercise.  Alcohol use: Rareley Smoking/tobacco use: Nonsmoker.  Regular dental exams: UTD Wears seat belt: Yes.  HISTORY:  Past Medical History:  Diagnosis Date  . Abdominal pain, other specified site   . Arthritis   . Asthma    Dr Lake Bells  . Chronic cough    s/p evaluation by allergist and pulmonologist   . Colon polyp   . Cough   . Diabetes mellitus without complication ([place])   . Diverticulosis   . Dizziness   . GERD (gastroesophageal reflux disease)   . Goiter   . Headache(784.0)   . Hemorrhoids   . Hiatal hernia   . Irritable bowel disease   . Menopause   . Shortness of breath   . Stress   . Wears glasses     Past Surgical History:  Procedure Laterality Date  . BRAVO [place] STUDY N/A 08/15/2013   Procedure: BRAVO [place] STUDY;  Surgeon: Lear Ng, MD;  Location: [facility]  ENDOSCOPY;  Service: Endoscopy;  Laterality: N/A;  . CESAREAN SECTION  [phone]  . CESAREAN SECTION  [phone]  . CHOLECYSTECTOMY  09/13/14  . COLONOSCOPY N/A 08/15/2013   Procedure: COLONOSCOPY;  Surgeon: Lear Ng, MD;  Location: [facility] ENDOSCOPY;  Service: Endoscopy;  Laterality: N/A;  . ESOPHAGOGASTRODUODENOSCOPY N/A 08/15/2013   Procedure: ESOPHAGOGASTRODUODENOSCOPY (EGD);  Surgeon: Lear Ng, MD;  Location: [facility];  Service: Endoscopy;  Laterality: N/A;  . jaw abscess  jan 2012  . LAPAROSCOPY  1981  . ovarin cyst  2004   ovary removed   Family History  Problem Relation Age of Onset  . Stroke Mother   . Hypertension Mother   . Hyperlipidemia Mother   . Depression Mother   . Hypertension Sister   . Prostate cancer Father   . Liver cancer Father     metastatic liver  . Breast cancer Maternal Aunt   . Depression Maternal Aunt   . Depression Maternal Grandfather   . Depression Cousin   . Pancreatic cancer Paternal Aunt   . Pancreatic cancer Paternal Grandmother       ALLERGIES: Latex; Nickel; Benzalkonium chloride; and Neosporin [neomycin-bacitracin zn-polymyx]  Current Outpatient Prescriptions on File Prior to Visit  Medication Sig Dispense Refill  . albuterol (PROVENTIL HFA;VENTOLIN HFA) 108 (90 BASE) MCG/ACT inhaler Inhale  2 puffs into the lungs every 4 (four) hours as needed for wheezing or shortness of breath. 1 Inhaler 6  . amitriptyline (ELAVIL) 150 MG tablet TAKE 1/2 TABLET BY MOUTH DAILY AT BEDTIME 30 tablet 5  . aspirin 81 MG tablet Take 81 mg by mouth daily.    Marland Kitchen BREO ELLIPTA 100-25 MCG/INH AEPB INHALE 1 PUFF INTO THE LUNGS DAILY. 60 each 5  . Cholecalciferol (VITAMIN D PO) Take by mouth daily.    . Cinnamon 500 MG capsule Take 500 mg by mouth daily.    Marland Kitchen docusate sodium (COLACE) 100 MG capsule Take 100 mg by mouth 2 (two) times daily.    . folic acid (FOLVITE) A999333 MCG tablet Take 400 mcg by mouth daily.    . hydroxypropyl methylcellulose /  hypromellose (ISOPTO TEARS / GONIOVISC) 2.5 % ophthalmic solution Place 1 drop into both eyes as needed for dry eyes.    . Misc Natural Products (TART CHERRY ADVANCED PO) Take 1,200 mg by mouth daily.    . Multiple Vitamin (MULTIVITAMIN) tablet Take 1 tablet by mouth daily.    . mupirocin cream (BACTROBAN) 2 % Apply 1 application topically 3 (three) times daily. 15 g 0  . Omega-3 Fatty Acids (FISH OIL) 1200 MG CAPS Take by mouth.    . TURMERIC PO Take by mouth.     No current facility-administered medications on file prior to visit.     Social History  Substance Use Topics  . Smoking status: Never Smoker  . Smokeless tobacco: Never Used  . Alcohol use 0.0 oz/week    1 - 2 Cans of beer per week     Comment: rarely    Review of Systems  Constitutional: Negative for chills, fever and unexpected weight change.  HENT: Negative for congestion, trouble swallowing and voice change.   Respiratory: Positive for cough (chronic). Negative for shortness of breath and wheezing.   Cardiovascular: Negative for chest pain, palpitations and leg swelling.  Gastrointestinal: Negative for abdominal distention, abdominal pain, nausea and vomiting.  Musculoskeletal: Negative for arthralgias and myalgias.  Skin: Negative for rash.  Neurological: Negative for headaches.  Hematological: Negative for adenopathy.  Psychiatric/Behavioral: Negative for confusion.      Objective:    BP 132/78   Pulse 90   Temp 98.4 F (36.9 C) (Oral)   Ht 5\' 5"  (1.651 m)   Wt 197 lb 9.6 oz (89.6 kg)   SpO2 97%   BMI 32.88 kg/m   BP Readings from Last 3 Encounters:  04/14/16 132/78  01/13/16 108/80  12/01/15 124/80   Wt Readings from Last 3 Encounters:  04/14/16 197 lb 9.6 oz (89.6 kg)  01/13/16 196 lb (88.9 kg)  12/01/15 198 lb (89.8 kg)    Physical Exam  Constitutional: She appears well-developed and well-nourished.  Eyes: Conjunctivae are normal.  Neck: No thyroid mass and no thyromegaly present.    Cardiovascular: Normal rate, regular rhythm, normal heart sounds and normal pulses.   Pulmonary/Chest: Effort normal and breath sounds normal. She has no wheezes. She has no rhonchi. She has no rales. Right breast exhibits no inverted nipple, no mass, no nipple discharge, no skin change and no tenderness. Left breast exhibits no inverted nipple, no mass, no nipple discharge, no skin change and no tenderness. Breasts are symmetrical.  No masses or asymmetry appreciated during CBE.  Genitourinary: There is no rash on the right labia. There is no rash on the left labia. Uterus is not enlarged, not fixed and  not tender. Cervix exhibits no motion tenderness, no discharge and no friability. Right adnexum displays no mass, no tenderness and no fullness. Left adnexum displays no mass, no tenderness and no fullness. No erythema, tenderness or bleeding in the vagina. No foreign body in the vagina. No vaginal discharge found.  Genitourinary Comments: Pap performed. No CMT. Unable to appreciated ovaries.  Lymphadenopathy:       Head (right side): No submental, no submandibular, no tonsillar, no preauricular, no posterior auricular and no occipital adenopathy present.       Head (left side): No submental, no submandibular, no tonsillar, no preauricular, no posterior auricular and no occipital adenopathy present.       Right cervical: No superficial cervical, no deep cervical and no posterior cervical adenopathy present.      Left cervical: No superficial cervical, no deep cervical and no posterior cervical adenopathy present.    She has no axillary adenopathy.       Right axillary: No pectoral and no lateral adenopathy present.       Left axillary: No pectoral and no lateral adenopathy present. Neurological: She is alert.  Skin: Skin is warm and dry.  Psychiatric: She has a normal mood and affect. Her speech is normal and behavior is normal. Thought content normal.  Vitals reviewed.      Assessment & Plan:    Problem List Items Addressed This Visit      Digestive   GERD (gastroesophageal reflux disease)    Stable. Controlled. We'll continue to follow.        Endocrine   Goiter, nontoxic, multinodular    No masses felt on thyroid exam. Pending TSH. We'll continue to follow.      Diabetes mellitus type 2, controlled ([place])    Diet controlled. Pending A1c        Other   Routine general medical examination at a health care facility - Primary    Up-to-date on colonoscopy. Due for repeat colonoscopy 2020. Mammogram due and patient will schedule this in December of this year. Pelvic and Pap smear done today. Deferred DEXA scanning at this time. Patient is not a smoker. Up-to-date on immunizations. Screening labs which were not included earlier this year have been ordered today including hepatitis C. Encouraged exercise and healthy diet.      Relevant Orders   MM DIGITAL SCREENING BILATERAL   Hemoglobin A1c   Hepatitis C antibody   TSH   Cytology - PAP    Other Visit Diagnoses   None.      I am having Ms. Weyrauch maintain her Fish Oil, multivitamin, folic acid, Misc Natural Products (TART CHERRY ADVANCED PO), aspirin, docusate sodium, Cholecalciferol (VITAMIN D PO), hydroxypropyl methylcellulose / hypromellose, albuterol, TURMERIC PO, Cinnamon, mupirocin cream, BREO ELLIPTA, and amitriptyline.   No orders of the defined types were placed in this encounter.   Return precautions given.   Risks, benefits, and alternatives of the medications and treatment plan prescribed today were discussed, and patient expressed understanding.   Education regarding symptom management and diagnosis given to patient on AVS.   Continue to follow with Mable Paris, FNP for routine health maintenance.   Laura Underwood and I agreed with plan.   Mable Paris, FNP

## 2016-04-15 LAB — CYTOLOGY - PAP
DIAGNOSIS: NEGATIVE
HPV (WINDOPATH): NOT DETECTED

## 2016-04-15 LAB — HEPATITIS C ANTIBODY: HCV Ab: NEGATIVE

## 2016-04-16 LAB — HM PAP SMEAR: HM PAP: NEGATIVE

## 2016-05-28 LAB — HM MAMMOGRAPHY

## 2016-05-31 ENCOUNTER — Ambulatory Visit: Payer: BLUE CROSS/BLUE SHIELD | Admitting: Pulmonary Disease

## 2016-05-31 ENCOUNTER — Ambulatory Visit (INDEPENDENT_AMBULATORY_CARE_PROVIDER_SITE_OTHER): Payer: BLUE CROSS/BLUE SHIELD | Admitting: Podiatry

## 2016-05-31 VITALS — BP 145/92 | HR 97 | Temp 98.9°F

## 2016-05-31 DIAGNOSIS — E119 Type 2 diabetes mellitus without complications: Secondary | ICD-10-CM | POA: Diagnosis not present

## 2016-05-31 DIAGNOSIS — S91309A Unspecified open wound, unspecified foot, initial encounter: Secondary | ICD-10-CM

## 2016-05-31 NOTE — Progress Notes (Signed)
She presents today for her yearly follow-up diabetic foot exam states that she's doing just fine no complications. States that she has a small laceration underneath her second toe near the tip where she had a Band-Aid for the cyst there have been plaguing her second toe dorsally. She has a history of a mucoid cyst there. States that her hemoglobin A1c is currently 6.6 which is slightly elevated from last year. She states that she has some numbness in the tips of the toes numbers 1 and 2 bilaterally right greater than left.  Objective: Vital signs are stable she's alert and oriented 3 pulses are palpable neurologic sensorium is intact deep tendon reflexes intact muscle strength is normal and symmetrical bilateral. Orthopedic evaluation demonstrates all joints distal to the ankle range of motion without crepitation. Continued survey which demonstrates a well-hydrated cutis no open lesions or wounds with exception of a small laceration measuring less than a half centimeter in total length and less than 1 mm in width no purulence or malodor granulation tissue is noted it looks perfectly clean with no signs of infection.  Assessment: Diabetes mellitus without complications.  Plan: Follow up with her in 1 year. Continue to watch the slight laceration plantar aspect of the foot and finally 3 question or concerns.

## 2016-07-12 ENCOUNTER — Ambulatory Visit (INDEPENDENT_AMBULATORY_CARE_PROVIDER_SITE_OTHER)
Admission: RE | Admit: 2016-07-12 | Discharge: 2016-07-12 | Disposition: A | Discharge status: Home / Self Care | Payer: BLUE CROSS/BLUE SHIELD | Source: Ambulatory Visit | Attending: Pulmonary Disease | Admitting: Pulmonary Disease

## 2016-07-12 ENCOUNTER — Ambulatory Visit (INDEPENDENT_AMBULATORY_CARE_PROVIDER_SITE_OTHER): Payer: BLUE CROSS/BLUE SHIELD | Admitting: Pulmonary Disease

## 2016-07-12 ENCOUNTER — Encounter: Payer: Self-pay | Admitting: Pulmonary Disease

## 2016-07-12 DIAGNOSIS — R0600 Dyspnea, unspecified: Secondary | ICD-10-CM | POA: Insufficient documentation

## 2016-07-12 DIAGNOSIS — R05 Cough: Secondary | ICD-10-CM | POA: Diagnosis not present

## 2016-07-12 DIAGNOSIS — J4530 Mild persistent asthma, uncomplicated: Secondary | ICD-10-CM | POA: Diagnosis not present

## 2016-07-12 DIAGNOSIS — R0602 Shortness of breath: Secondary | ICD-10-CM | POA: Diagnosis not present

## 2016-07-12 DIAGNOSIS — J45909 Unspecified asthma, uncomplicated: Secondary | ICD-10-CM | POA: Insufficient documentation

## 2016-07-12 DIAGNOSIS — R053 Chronic cough: Secondary | ICD-10-CM

## 2016-07-12 IMAGING — DX DG CHEST 2V
2 series · 2 of 2 positions shown · non-contrast
Comparison: PA and lateral chest x-ray February 11, 2014

CLINICAL DATA: Cough and shortness of breath for the past 2 weeks.
History of asthma and diabetes.

EXAM:
CHEST  2 VIEW

[chest pa]
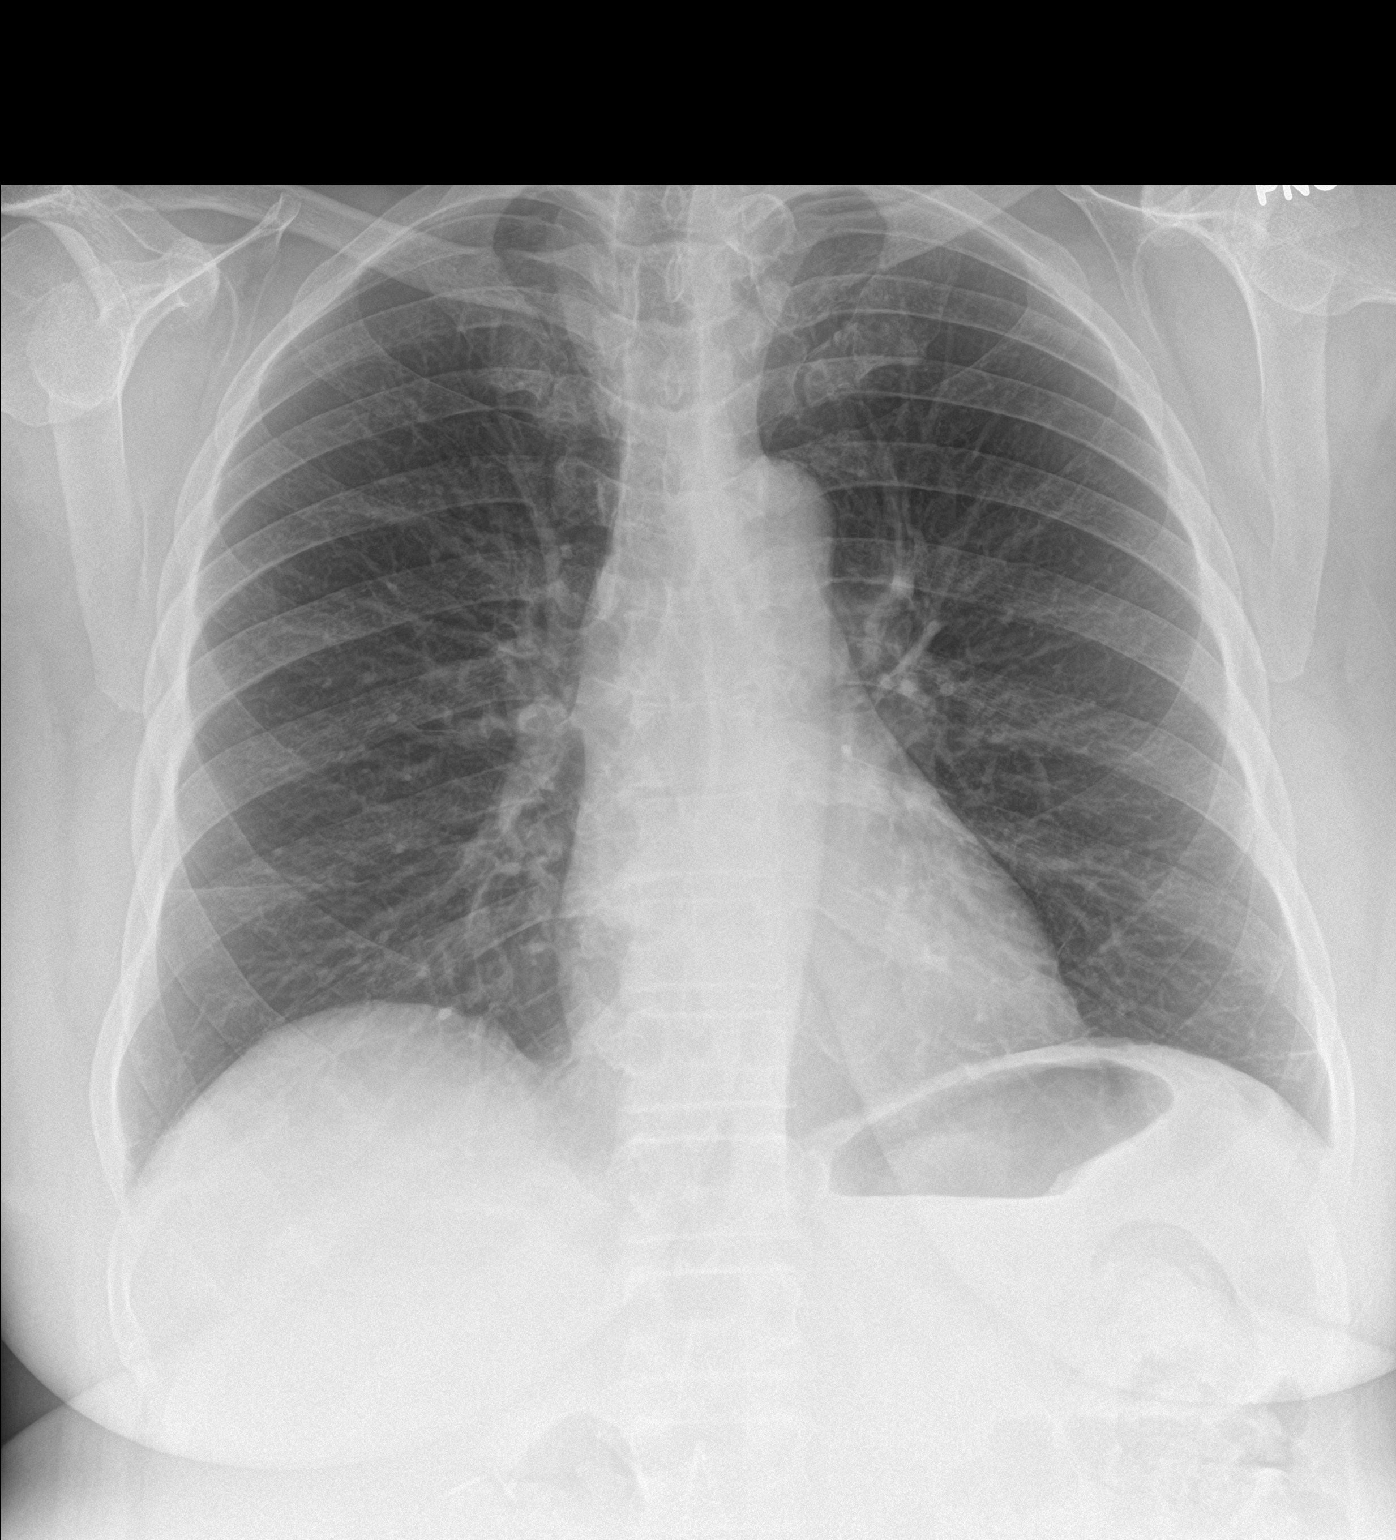

[chest lat]
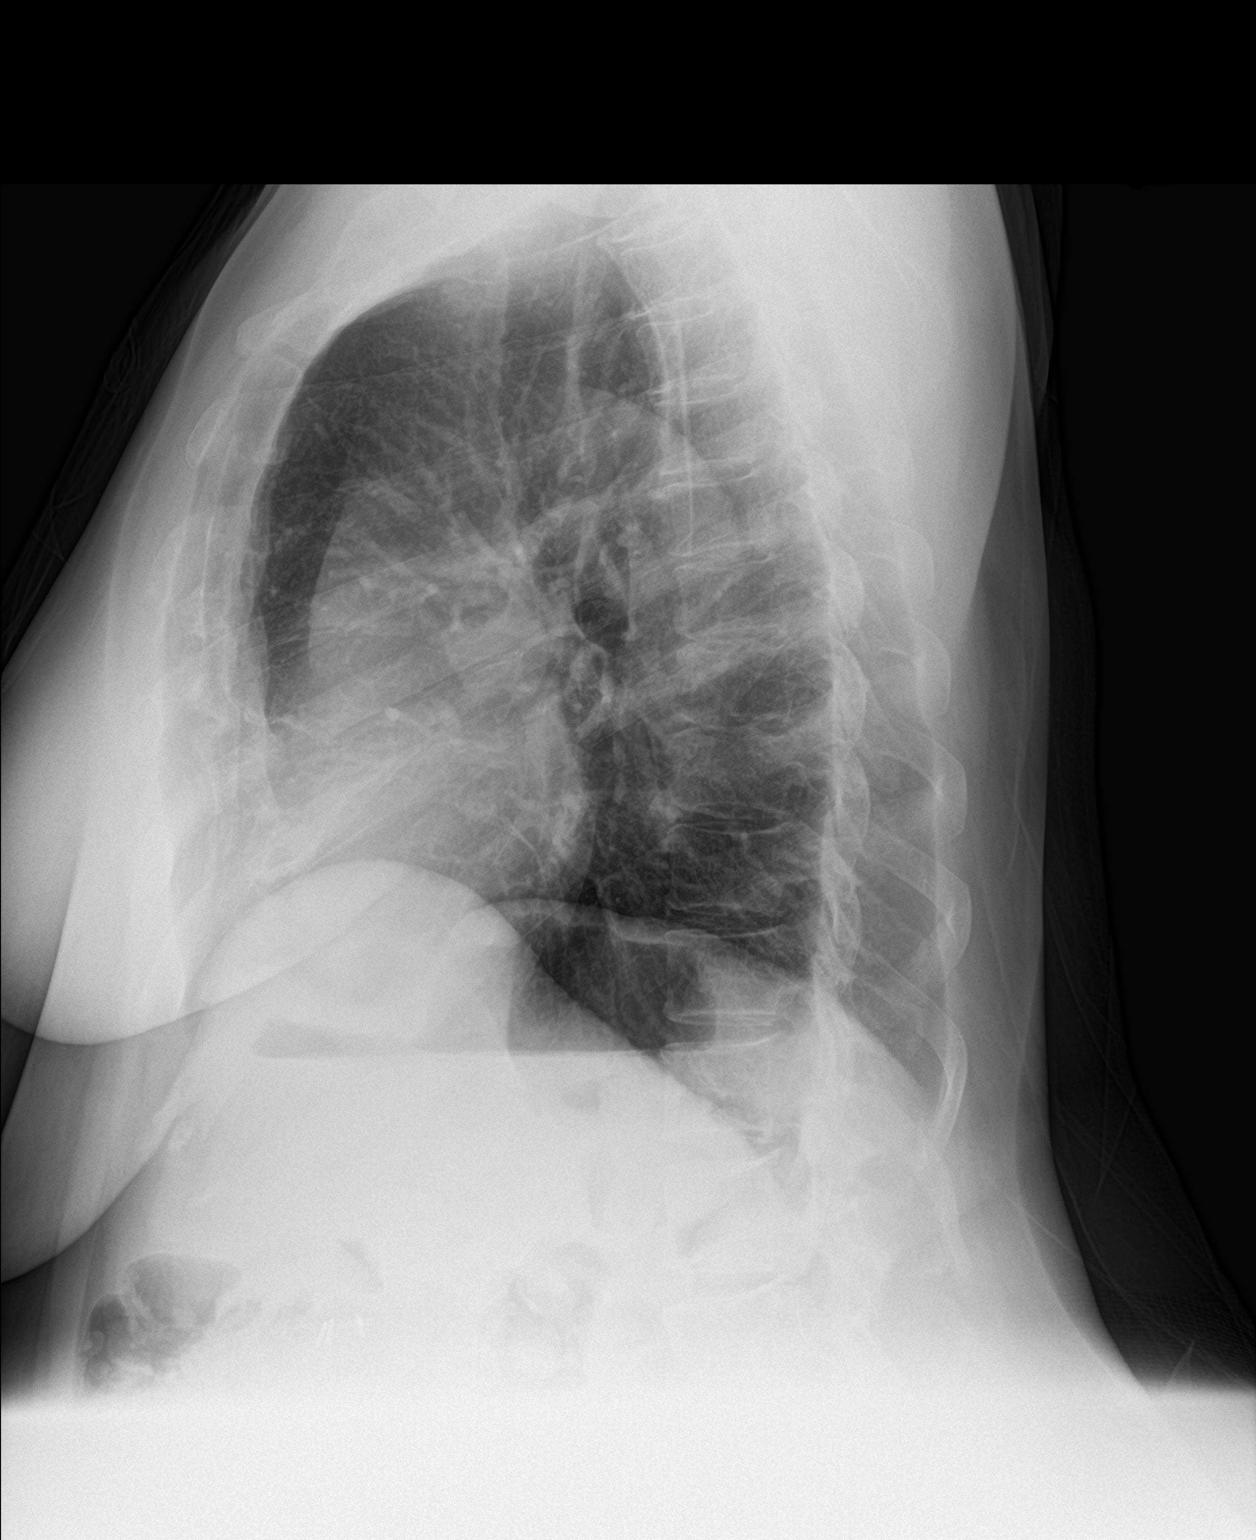

[2 of 2 positions shown; findings below may reference images not displayed]

FINDINGS: The lungs are adequately inflated. The interstitial markings are
coarse but stable. There is no alveolar infiltrate or pleural
effusion. The heart and pulmonary vascularity are normal. The
mediastinum is normal in width. The bony thorax exhibits no acute
abnormality.
IMPRESSION: Subsegmental atelectasis is suspected in the lingula and right
middle lobe. There is no alveolar pneumonia nor CHF.

## 2016-07-12 NOTE — Assessment & Plan Note (Signed)
Mrs. Laura Underwood continues to struggle from chronic cough. Elavil has all my controlled her symptoms to a moderate degree. Gabapentin was ineffective when we used it 2 years ago.  Today I advised her on an upcoming clinical trial for chronic cough. She is very much interested in this. She would like to stop Elavil because she says it's not helping.  Plan: Will make arrangements for her to be considered for screening for a clinical trial for chronic cough.

## 2016-07-12 NOTE — Assessment & Plan Note (Signed)
As described above she had one recent episode of dyspnea. Today on exam her lungs are clear to auscultation, her cardiac exam was within normal limits and her oxygenation is normal. This may have been related to her asthma.   Plan: Chest x-ray

## 2016-07-12 NOTE — Patient Instructions (Signed)
We will call you with the results of today's chest x-ray We will make arrangements for you to be screened for a clinical trial for chronic cough Keep taking Elavil for now, we will advise you when it's appropriate to stop. We will see you back in 6 months for a routine clinic visit we will see you sooner than that for clinical trial purposes.

## 2016-07-12 NOTE — Progress Notes (Signed)
Subjective:    Patient ID: Laura Underwood, female    DOB: 07-29-1950, 66 y.o.   MRN: [id number]  Synopsis: Ms. Adami reestablish care with [facility] in 2015 for chronic cough with upper airway cough syndrome, positive methacholine challenge test, and possible acid reflux. Previously treated with gabapentin and Elavil for laryngeal sensitivity leading to cough. She had a normal simple spirometry test in 2012.  She has had a history of a negative esophageal pH probe, chest x-ray in June 2015 was normal.  HPI  Chief Complaint  Patient presents with  . Follow-up    cough and sob is unchanged- ventolin helps to suppress cough.     Cough: > she still has daily symptoms  > typically starts as a dry cough > has some dyspnea when she has a bad coughing spell  > the cough interferes with her lifestyle and limits her ability to get out of the house > her husband notes no cough when she is sleeping > she is still taking the Elavil, but she doesn't think it helps > she uses the albuterol to help stop the cough > she still sips cold water and tries to limit use of her voice box to help it stop > ventolin really helps > the cough is sometimes associated with pain in her chest > she continues to have stress incontinence  She continues to take [place] daily.  She rarely notes any respiratory complaints apart from the cough.  However one day recently she had an episode of shortness of breath, but this was an isolated event.  She only uses the ventolin for the cough, not for the dyspnea.   Past Medical History:  Diagnosis Date  . Abdominal pain, other specified site   . Arthritis   . Asthma    Dr Lake Bells  . Chronic cough    s/p evaluation by allergist and pulmonologist   . Colon polyp   . Cough   . Diabetes mellitus without complication ([place])   . Diverticulosis   . Dizziness   . GERD (gastroesophageal reflux disease)   . Goiter   . Headache(784.0)   . Hemorrhoids   . Hiatal  hernia   . Irritable bowel disease   . Menopause   . Shortness of breath   . Stress   . Wears glasses      Review of Systems  Constitutional: Negative for chills, fatigue and fever.  HENT: Positive for postnasal drip. Negative for sneezing and sore throat.   Respiratory: Positive for cough. Negative for shortness of breath and wheezing.        Objective:   Physical Exam Vitals:   07/12/16 1114  BP: 140/84  BP Location: Right Arm  Cuff Size: Normal  Pulse: 98  SpO2: 98%  Weight: 198 lb (89.8 kg)  Height: 5\' 5"  (1.651 m)    Gen: well appearing HENT: OP clear, TM's clear, neck supple PULM: CTA B, normal percussion CV: RRR, no mgr, trace edema GI: BS+, soft, nontender Derm: no cyanosis or rash Psyche: normal mood and affect   Records from her 02/2016 visit with her PCP reviewed Where she was cared for for her diabetes and acid reflux. She was noted to be stable in regards to both of these chronic conditions.  BMET    Component Value Date/Time   NA 140 10/06/2015 1011   K 4.5 10/06/2015 1011   CL 104 10/06/2015 1011   CO2 29 10/06/2015 1011   GLUCOSE 103 (  H) 10/06/2015 1011   BUN 13 10/06/2015 1011   CREATININE 0.70 10/06/2015 1011   CALCIUM 9.5 10/06/2015 1011        Assessment & Plan:   Asthma She has had mild persistent asthma for years which has been stable. However, she did have a recent episode of dyspnea which was relatively unexplained. This was a self-limited event. She does not require albuterol for recurrent chest tightness or wheezing.  Plan: Continue Brio as prescribed Hand hygiene encouraged today  Dyspnea As described above she had one recent episode of dyspnea. Today on exam her lungs are clear to auscultation, her cardiac exam was within normal limits and her oxygenation is normal. This may have been related to her asthma.   Plan: Chest x-ray  Chronic cough Mrs. Aguillard continues to struggle from chronic cough. Elavil has all my  controlled her symptoms to a moderate degree. Gabapentin was ineffective when we used it 2 years ago.  Today I advised her on an upcoming clinical trial for chronic cough. She is very much interested in this. She would like to stop Elavil because she says it's not helping.  Plan: Will make arrangements for her to be considered for screening for a clinical trial for chronic cough.   Updated Medication List Outpatient Encounter Prescriptions as of 07/12/2016  Medication Sig  . albuterol (PROVENTIL HFA;VENTOLIN HFA) 108 (90 BASE) MCG/ACT inhaler Inhale 2 puffs into the lungs every 4 (four) hours as needed for wheezing or shortness of breath.  Marland Kitchen amitriptyline (ELAVIL) 150 MG tablet TAKE 1/2 TABLET BY MOUTH DAILY AT BEDTIME  . aspirin 81 MG tablet Take 81 mg by mouth daily.  Marland Kitchen BREO ELLIPTA 100-25 MCG/INH AEPB INHALE 1 PUFF INTO THE LUNGS DAILY.  . cetirizine (ZYRTEC) 10 MG tablet Take 1 mg by mouth daily.  . Cholecalciferol (VITAMIN D PO) Take by mouth daily.  . Cinnamon 500 MG capsule Take 500 mg by mouth daily.  Marland Kitchen docusate sodium (COLACE) 100 MG capsule Take 100 mg by mouth 2 (two) times daily.  . folic acid (FOLVITE) A999333 MCG tablet Take 400 mcg by mouth daily.  . hydroxypropyl methylcellulose / hypromellose (ISOPTO TEARS / GONIOVISC) 2.5 % ophthalmic solution Place 1 drop into both eyes as needed for dry eyes.  . Misc Natural Products (TART CHERRY ADVANCED PO) Take 1,200 mg by mouth daily.  . Multiple Vitamin (MULTIVITAMIN) tablet Take 1 tablet by mouth daily.  . Omega-3 Fatty Acids (FISH OIL) 1200 MG CAPS Take by mouth.  . TURMERIC PO Take by mouth.  . [DISCONTINUED] mupirocin cream (BACTROBAN) 2 % Apply 1 application topically 3 (three) times daily. (Patient not taking: Reported on 07/12/2016)   No facility-administered encounter medications on file as of 07/12/2016.

## 2016-07-12 NOTE — Assessment & Plan Note (Signed)
She has had mild persistent asthma for years which has been stable. However, she did have a recent episode of dyspnea which was relatively unexplained. This was a self-limited event. She does not require albuterol for recurrent chest tightness or wheezing.  Plan: Continue Brio as prescribed Hand hygiene encouraged today

## 2016-07-12 NOTE — Addendum Note (Signed)
Addended by: Len Blalock on: 07/12/2016 11:41 AM   Modules accepted: Orders

## 2016-07-15 ENCOUNTER — Ambulatory Visit (INDEPENDENT_AMBULATORY_CARE_PROVIDER_SITE_OTHER): Payer: BLUE CROSS/BLUE SHIELD | Admitting: Family

## 2016-07-15 ENCOUNTER — Encounter: Payer: Self-pay | Admitting: Family

## 2016-07-15 VITALS — BP 148/88 | HR 103 | Temp 98.1°F | Ht 65.0 in | Wt 197.2 lb

## 2016-07-15 DIAGNOSIS — E119 Type 2 diabetes mellitus without complications: Secondary | ICD-10-CM | POA: Diagnosis not present

## 2016-07-15 LAB — POCT GLYCOSYLATED HEMOGLOBIN (HGB A1C): Hemoglobin A1C: 5.9

## 2016-07-15 MED ORDER — METFORMIN HCL ER 500 MG PO TB24: ORAL_TABLET | ORAL | 3 refills | Status: DC

## 2016-07-15 NOTE — Patient Instructions (Addendum)
F/u 3 months  Start metformin  Pleasure seeing you always

## 2016-07-15 NOTE — Progress Notes (Signed)
Pre visit review using our clinic review tool, if applicable. No additional management support is needed unless otherwise documented below in the visit note. 

## 2016-07-15 NOTE — Assessment & Plan Note (Signed)
Starting 500mg  metformin. a1c 5.9 in prediabetes. Agreed metformin trial for weight loss. May come off if a1c stays well controlled.

## 2016-07-15 NOTE — Progress Notes (Signed)
Subjective:    Patient ID: Laura Underwood, female    DOB: 1950/09/19, 66 y.o.   MRN: [id number]  CC: Laura Underwood is a 66 y.o. female who presents today for follow up.   HPI: DM  Last a1c 6.6. Trying exercise. Fasting BS 140- 118.    Pending clinical trial with Laura Underwood for chronic cough.     HISTORY:  Past Medical History:  Diagnosis Date  . Abdominal pain, other specified site   . Arthritis   . Asthma    Laura Underwood  . Chronic cough    s/p evaluation by allergist and pulmonologist   . Colon polyp   . Cough   . Diabetes mellitus without complication ([place])   . Diverticulosis   . Dizziness   . GERD (gastroesophageal reflux disease)   . Goiter   . Headache(784.0)   . Hemorrhoids   . Hiatal hernia   . Irritable bowel disease   . Menopause   . Shortness of breath   . Stress   . Wears glasses    Past Surgical History:  Procedure Laterality Date  . BRAVO [place] STUDY N/A 08/15/2013   Procedure: BRAVO [place] STUDY;  Surgeon: Laura Ng, MD;  Location: [facility];  Service: Endoscopy;  Laterality: N/A;  . CESAREAN SECTION  [phone]  . CESAREAN SECTION  [phone]  . CHOLECYSTECTOMY  09/13/14  . COLONOSCOPY N/A 08/15/2013   Procedure: COLONOSCOPY;  Surgeon: Laura Ng, MD;  Location: [facility];  Service: Endoscopy;  Laterality: N/A;  . ESOPHAGOGASTRODUODENOSCOPY N/A 08/15/2013   Procedure: ESOPHAGOGASTRODUODENOSCOPY (EGD);  Surgeon: Laura Ng, MD;  Location: [facility];  Service: Endoscopy;  Laterality: N/A;  . jaw abscess  jan 2012  . LAPAROSCOPY  1981  . ovarin cyst  2004   ovary removed   Family History  Problem Relation Age of Onset  . Stroke Mother   . Hypertension Mother   . Hyperlipidemia Mother   . Depression Mother   . Hypertension Sister   . Prostate cancer Father   . Liver cancer Father     metastatic liver  . Breast cancer Maternal Aunt   . Depression Maternal Aunt   . Depression Maternal Grandfather   . Depression Cousin    . Pancreatic cancer Paternal Aunt   . Pancreatic cancer Paternal Grandmother     Allergies: Latex; Nickel; Benzalkonium chloride; and Neosporin [neomycin-bacitracin zn-polymyx] Current Outpatient Prescriptions on File Prior to Visit  Medication Sig Dispense Refill  . albuterol (PROVENTIL HFA;VENTOLIN HFA) 108 (90 BASE) MCG/ACT inhaler Inhale 2 puffs into the lungs every 4 (four) hours as needed for wheezing or shortness of breath. 1 Inhaler 6  . amitriptyline (ELAVIL) 150 MG tablet TAKE 1/2 TABLET BY MOUTH DAILY AT BEDTIME 30 tablet 5  . aspirin 81 MG tablet Take 81 mg by mouth daily.    Marland Kitchen BREO ELLIPTA 100-25 MCG/INH AEPB INHALE 1 PUFF INTO THE LUNGS DAILY. 60 each 5  . cetirizine (ZYRTEC) 10 MG tablet Take 1 mg by mouth daily.    . Cholecalciferol (VITAMIN D PO) Take by mouth daily.    . Cinnamon 500 MG capsule Take 500 mg by mouth daily.    Marland Kitchen docusate sodium (COLACE) 100 MG capsule Take 100 mg by mouth 2 (two) times daily.    . folic acid (FOLVITE) A999333 MCG tablet Take 400 mcg by mouth daily.    . hydroxypropyl methylcellulose / hypromellose (ISOPTO TEARS / GONIOVISC) 2.5 % ophthalmic solution Place  1 drop into both eyes as needed for dry eyes.    . Misc Natural Products (TART CHERRY ADVANCED PO) Take 1,200 mg by mouth daily.    . Multiple Vitamin (MULTIVITAMIN) tablet Take 1 tablet by mouth daily.    . Omega-3 Fatty Acids (FISH OIL) 1200 MG CAPS Take by mouth.    . TURMERIC PO Take by mouth.     No current facility-administered medications on file prior to visit.     Social History  Substance Use Topics  . Smoking status: Never Smoker  . Smokeless tobacco: Never Used  . Alcohol use 0.0 oz/week    1 - 2 Cans of beer per week     Comment: rarely    Review of Systems    Objective:    BP (!) 148/88   Pulse (!) 103   Temp 98.1 F (36.7 C) (Oral)   Ht 5\' 5"  (1.651 m)   Wt 197 lb 3.2 oz (89.4 kg)   SpO2 96%   BMI 32.82 kg/m  BP Readings from Last 3 Encounters:  07/15/16  (!) 148/88  07/12/16 140/84  05/31/16 (!) 145/92   Wt Readings from Last 3 Encounters:  07/15/16 197 lb 3.2 oz (89.4 kg)  07/12/16 198 lb (89.8 kg)  04/14/16 197 lb 9.6 oz (89.6 kg)    Physical Exam     Assessment & Plan:   Problem List Items Addressed This Visit      Endocrine   Diabetes mellitus type 2, controlled ([place]) - Primary    Starting 500mg  metformin. a1c 5.9 in prediabetes. Agreed metformin trial for weight loss. May come off if a1c stays well controlled.       Relevant Medications   metFORMIN (GLUCOPHAGE XR) 500 MG 24 hr tablet   Other Relevant Orders   POCT HgB A1C (Completed)       I am having Laura Underwood start on metFORMIN. I am also having her maintain her Fish Oil, multivitamin, folic acid, Misc Natural Products (TART CHERRY ADVANCED PO), aspirin, docusate sodium, Cholecalciferol (VITAMIN D PO), hydroxypropyl methylcellulose / hypromellose, albuterol, TURMERIC PO, Cinnamon, BREO ELLIPTA, amitriptyline, and cetirizine.   Meds ordered this encounter  Medications  . metFORMIN (GLUCOPHAGE XR) 500 MG 24 hr tablet    Sig: Start 500mg  PO qpm.    Dispense:  90 tablet    Refill:  3    Order Specific Question:   Supervising Provider    Answer:   Laura Underwood [[id number]]    Return precautions given.   Risks, benefits, and alternatives of the medications and treatment plan prescribed today were discussed, and patient expressed understanding.   Education regarding symptom management and diagnosis given to patient on AVS.  Continue to follow with Laura Paris, Laura Underwood for routine health maintenance.   Laura Underwood and I agreed with plan.   Laura Paris, Laura Underwood

## 2016-07-16 LAB — HM DIABETES EYE EXAM

## 2016-08-10 ENCOUNTER — Other Ambulatory Visit: Payer: Self-pay | Admitting: Pulmonary Disease

## 2016-09-09 ENCOUNTER — Encounter: Payer: Self-pay | Admitting: Pulmonary Disease

## 2016-10-18 ENCOUNTER — Ambulatory Visit (INDEPENDENT_AMBULATORY_CARE_PROVIDER_SITE_OTHER): Payer: BLUE CROSS/BLUE SHIELD | Admitting: Family

## 2016-10-18 ENCOUNTER — Encounter: Payer: Self-pay | Admitting: Family

## 2016-10-18 VITALS — BP 138/90 | HR 88 | Temp 98.1°F | Ht 65.0 in | Wt 199.8 lb

## 2016-10-18 DIAGNOSIS — R030 Elevated blood-pressure reading, without diagnosis of hypertension: Secondary | ICD-10-CM | POA: Diagnosis not present

## 2016-10-18 DIAGNOSIS — I10 Essential (primary) hypertension: Secondary | ICD-10-CM | POA: Insufficient documentation

## 2016-10-18 DIAGNOSIS — E119 Type 2 diabetes mellitus without complications: Secondary | ICD-10-CM | POA: Diagnosis not present

## 2016-10-18 LAB — POCT GLYCOSYLATED HEMOGLOBIN (HGB A1C): Hemoglobin A1C: 6.2

## 2016-10-18 NOTE — Assessment & Plan Note (Signed)
Compliant with metformin. A1c 6.2 today. We'll continue metformin.

## 2016-10-18 NOTE — Progress Notes (Signed)
Pre visit review using our clinic review tool, if applicable. No additional management support is needed unless otherwise documented below in the visit note. 

## 2016-10-18 NOTE — Assessment & Plan Note (Addendum)
Discussed at great length starting a walking program, focusing on weight loss. Advised low-salt diet. Patient monitor blood pressure at home and if persistently greater than 135/85, we will start medication.

## 2016-10-18 NOTE — Patient Instructions (Signed)
Walking program or some sort of exercise that you find enjoyable.   Monitor blood pressure - if persistently higher than 135/85, we need to consider starting a medication  f/u 3 months

## 2016-10-18 NOTE — Progress Notes (Signed)
Subjective:    Patient ID: Laura Underwood, female    DOB: 03/04/1951, 66 y.o.   MRN: [id number]  CC: Laura Underwood is a 66 y.o. female who presents today for follow up.   HPI: DM- started metformin at last visit. No hypoglycemia.   Fasting BS 105- 118.   No excercise.   Elevated blood pressure. No h/o HTN.  Denies exertional chest pain or pressure, numbness or tingling radiating to left arm or jaw, palpitations, dizziness, frequent headaches, changes in vision, or shortness of breath.      HISTORY:  Past Medical History:  Diagnosis Date  . Abdominal pain, other specified site   . Arthritis   . Asthma    Dr [place]  . Chronic cough    s/p evaluation by allergist and pulmonologist   . Colon polyp   . Cough   . Diabetes mellitus without complication ([place])   . Diverticulosis   . Dizziness   . GERD (gastroesophageal reflux disease)   . Goiter   . Headache(784.0)   . Hemorrhoids   . Hiatal hernia   . Irritable bowel disease   . Menopause   . Shortness of breath   . Stress   . Wears glasses    Past Surgical History:  Procedure Laterality Date  . BRAVO [place] STUDY N/A 08/15/2013   Procedure: BRAVO [place] STUDY;  Surgeon: Lear Ng, MD;  Location: [facility];  Service: Endoscopy;  Laterality: N/A;  . CESAREAN SECTION  [phone]  . CESAREAN SECTION  [phone]  . CHOLECYSTECTOMY  09/13/14  . COLONOSCOPY N/A 08/15/2013   Procedure: COLONOSCOPY;  Surgeon: Lear Ng, MD;  Location: [facility];  Service: Endoscopy;  Laterality: N/A;  . ESOPHAGOGASTRODUODENOSCOPY N/A 08/15/2013   Procedure: ESOPHAGOGASTRODUODENOSCOPY (EGD);  Surgeon: Lear Ng, MD;  Location: [facility];  Service: Endoscopy;  Laterality: N/A;  . jaw abscess  jan 2012  . LAPAROSCOPY  1981  . ovarin cyst  2004   ovary removed   Family History  Problem Relation Age of Onset  . Stroke Mother   . Hypertension Mother   . Hyperlipidemia Mother   . Depression Mother   . Hypertension  Sister   . Prostate cancer Father   . Liver cancer Father     metastatic liver  . Breast cancer Maternal Aunt   . Depression Maternal Aunt   . Depression Maternal Grandfather   . Depression Cousin   . Pancreatic cancer Paternal Aunt   . Pancreatic cancer Paternal Grandmother     Allergies: Latex; Nickel; Benzalkonium chloride; and Neosporin [neomycin-bacitracin zn-polymyx] Current Outpatient Prescriptions on File Prior to Visit  Medication Sig Dispense Refill  . albuterol (PROVENTIL HFA;VENTOLIN HFA) 108 (90 BASE) MCG/ACT inhaler Inhale 2 puffs into the lungs every 4 (four) hours as needed for wheezing or shortness of breath. 1 Inhaler 6  . amitriptyline (ELAVIL) 150 MG tablet TAKE 1/2 TABLET BY MOUTH DAILY AT BEDTIME 30 tablet 5  . aspirin 81 MG tablet Take 81 mg by mouth daily.    Marland Kitchen BREO ELLIPTA 100-25 MCG/INH AEPB INHALE 1 PUFF INTO THE LUNGS DAILY 60 each 5  . cetirizine (ZYRTEC) 10 MG tablet Take 1 mg by mouth daily.    . Cholecalciferol (VITAMIN D PO) Take by mouth daily.    . Cinnamon 500 MG capsule Take 500 mg by mouth daily.    Marland Kitchen docusate sodium (COLACE) 100 MG capsule Take 100 mg by mouth 2 (two) times daily.    Marland Kitchen  folic acid (FOLVITE) 122 MCG tablet Take 400 mcg by mouth daily.    . hydroxypropyl methylcellulose / hypromellose (ISOPTO TEARS / GONIOVISC) 2.5 % ophthalmic solution Place 1 drop into both eyes as needed for dry eyes.    . metFORMIN (GLUCOPHAGE XR) 500 MG 24 hr tablet Start 500mg  PO qpm. 90 tablet 3  . Misc Natural Products (TART CHERRY ADVANCED PO) Take 1,200 mg by mouth daily.    . Multiple Vitamin (MULTIVITAMIN) tablet Take 1 tablet by mouth daily.    . Omega-3 Fatty Acids (FISH OIL) 1200 MG CAPS Take by mouth.    . TURMERIC PO Take by mouth.     No current facility-administered medications on file prior to visit.     Social History  Substance Use Topics  . Smoking status: Never Smoker  . Smokeless tobacco: Never Used  . Alcohol use 0.0 oz/week    1 - 2  Cans of beer per week     Comment: rarely    Review of Systems  Constitutional: Negative for chills and fever.  Eyes: Negative for visual disturbance.  Respiratory: Negative for cough.   Cardiovascular: Negative for chest pain and palpitations.  Gastrointestinal: Negative for nausea and vomiting.  Neurological: Negative for headaches.      Objective:    BP 138/90   Pulse 88   Temp 98.1 F (36.7 C) (Oral)   Ht 5\' 5"  (1.651 m)   Wt 199 lb 12.8 oz (90.6 kg)   SpO2 98%   BMI 33.25 kg/m  BP Readings from Last 3 Encounters:  10/18/16 138/90  07/15/16 (!) 148/88  07/12/16 140/84   Wt Readings from Last 3 Encounters:  10/18/16 199 lb 12.8 oz (90.6 kg)  07/15/16 197 lb 3.2 oz (89.4 kg)  07/12/16 198 lb (89.8 kg)    Physical Exam  Constitutional: She appears well-developed and well-nourished.  Eyes: Conjunctivae are normal.  Cardiovascular: Normal rate, regular rhythm, normal heart sounds and normal pulses.   Pulmonary/Chest: Effort normal and breath sounds normal. She has no wheezes. She has no rhonchi. She has no rales.  Neurological: She is alert.  Skin: Skin is warm and dry.  Psychiatric: She has a normal mood and affect. Her speech is normal and behavior is normal. Thought content normal.  Vitals reviewed.      Assessment & Plan:   Problem List Items Addressed This Visit      Endocrine   Diabetes mellitus type 2, controlled ([place])    Compliant with metformin. A1c 6.2 today. We'll continue metformin.      Relevant Orders   POCT HgB A1C (Completed)     Other   Elevated blood pressure reading - Primary    Discussed at great length starting a walking program, focusing on weight loss. Advised low-salt diet. Patient monitor blood pressure at home and if persistently greater than 135/85, we will start medication.          I am having [name] maintain her Fish Oil, multivitamin, folic acid, Misc Natural Products (TART CHERRY ADVANCED PO), aspirin, docusate  sodium, Cholecalciferol (VITAMIN D PO), hydroxypropyl methylcellulose / hypromellose, albuterol, TURMERIC PO, Cinnamon, amitriptyline, cetirizine, metFORMIN, and BREO ELLIPTA.   No orders of the defined types were placed in this encounter.   Return precautions given.   Risks, benefits, and alternatives of the medications and treatment plan prescribed today were discussed, and patient expressed understanding.   Education regarding symptom management and diagnosis given to patient on AVS.  Continue  to follow with Burnard Hawthorne, FNP for routine health maintenance.   Laura Underwood and I agreed with plan.   Mable Paris, FNP

## 2016-12-16 ENCOUNTER — Other Ambulatory Visit: Payer: Self-pay | Admitting: Pulmonary Disease

## 2016-12-24 ENCOUNTER — Telehealth: Payer: Self-pay | Admitting: Internal Medicine

## 2016-12-24 NOTE — Telephone Encounter (Signed)
Dr.Mcquaid referred this subject to the research program as a potential subject for Volcano-2 chronic cough trial. I left a message for the subject to call the research office back to discuss this trial.  Mayfield Bing, CMA

## 2017-01-03 NOTE — Telephone Encounter (Signed)
Spoke with subject on July 16th and mailed an informed consent form. Subject is scheduled for research visit on July 24th.

## 2017-01-04 ENCOUNTER — Ambulatory Visit (INDEPENDENT_AMBULATORY_CARE_PROVIDER_SITE_OTHER): Payer: BLUE CROSS/BLUE SHIELD | Admitting: Pulmonary Disease

## 2017-01-04 DIAGNOSIS — R05 Cough: Secondary | ICD-10-CM

## 2017-01-04 DIAGNOSIS — Z006 Encounter for examination for normal comparison and control in clinical research program: Secondary | ICD-10-CM

## 2017-01-04 DIAGNOSIS — R053 Chronic cough: Secondary | ICD-10-CM

## 2017-01-04 NOTE — Progress Notes (Signed)
Title: A Double- Blind, Randomized, Placebo Controlled Study of the Efficacy and Safety of Three Doses of Orvepitant in Subjects With Chronic Refractory Cough Sponsor: [employer]; Protocol Number: VOLCANO-2;  NCT Trial #: [id number]; Phase of Development: Phase 2  Synopsis: Randomized subjects 1:1:1:1 entering a three week screening period and randomized at baseline/day 1 and followed throughout a 12 week double blind dosing. Doses of orvepitant (10mg/day, 20mg/day and 30mg/day) or a placebo will be administered and evaluated in the reduction of awake objective cough frequency  Key Inclusion Criteria:  Awake cough frequency of greater than/equal to 10 coughs/hour Female/Female subjects greater than/equal to 18 yrs. Diagnosis of CRC/unexplained cough for at least 1 year Non pregnant/lactating females Two forms of birth control- for female and female subjects  Key Exclusion Criteria: Current smokers or ex smokers within 6 months Recent respiratory tract infection Malignancy in past 5 years except if in remission or skin cancers unless approved by sponsor Treatment with Angiotensin Converting Enzyme inhibitors within 3 months of screening History of drug/alcohol abuse within 12 months of screening Hx of cystic fibrosis,idiopathic pulmonary fibrosis, moderate-severe asthma, bronchiectasis, COPD, CHF Significant or unstable psychiatric condition Renal transplant, current dialysis, or Hx of renal tubular acidosis -No GFR/Cr cut off noted Uncontrolled hypertension at screening Significant neurological disorder- **prior PMH or increased risk of seizures (not febrile), or recent (within 6 months) of head trauma or loss of consciousness or concussion Recent myocardial infarction (within 1 year of screening) drug use taken solely for treatment of cough prohibited from at least 1 month prior to screening) Concomitant respiratory medication is allowed, but subjects must be stable on medication  and take it for the duration of the study IMPORTANT MEDICATIONS PATIENT CAN NOT BE ON: grapefruit juice, St. John's Wort, digoxin, diltiazem, fluconazole, verapamil, ciprofloaxcin, amiodarone, carvedilol, azithromycin ---> All from Screening until 1 week after LAST dose  Key End Points: Change from baseline to week 12 in awake objective cough frequency measured with an automated cough monitor ([employer]oj[employer]) using 10mg, 20mg, 30mg orvepitant versus placebo  Key features of Volcano 2: Orvepitant is a centrally active, highly potent and selective NK(neurokinin)-1 receptor antagonist, recognized of its potential therapy for refractory cough Orvepitant was originally investigated for Major Depressive Disorder and PTSD Half Life of 7-15 hrs after single dose and increases to 34 hours after repeated dosing  Safety of Volcano 2 based on IB Version 6.0 and Date 09 Mar 2018__: Orvepitant has been administered in phase 2 studies to 557 patients to date- generally safe and well tolerated  A top dose of 30mg has been chosen based on efficacy/safety in Volcano 1 Monitored with assessments via cough monitor, lab work (no fasting needed), EKG, cough questionnaires, spirometry, physical exams, vital signs and adverse event reporting Fatigue, Dizziness, dry mouth, nausea and palpitations were most common AEs reported As of 20 Aug 2016 one death has occured- not considered related to study drug (pg110 in IB) ?  Study [id number]: Adverse Events Reported of Subjects in Any Treatment Group (Preferred Terms) Table 5.4-4,5 in IB   Placebo Orevipitant 10mg        30mg          60mg  > 5% side effect    Headache 9% 0%            9%             9%  Diarrhea 5%  0%            6%               6%  Somnolence 3%  0%            9%             9%  Nausea 8%  0%             5%            5%  Dry Mouth 8% 0%             7%             7%  Fatigue 2% 0%             4%            6%  Palpitations 1%  0%             3%             1%      Rare but important: Convulsions    Seen in (3) subjects taking 60mg /day- dose has been eliminated       Subjective: Laura Underwood is here today for her consenting visit for the [facility] all [place] study as documented above.  She takes albuterol occasionally for cough, zyrtec for sinus congestion. Her Asthma symptoms have been stable, occassional dyspnea.    Objective: There were no vitals filed for this visit.   Gen: well appearing, no acute distress HENT: NCAT, OP clear, neck supple without masses Eyes: PERRL, EOMi Lymph: no cervical lymphadenopathy PULM: CTA B CV: RRR, no mgr, no JVD GI: BS+, soft, nontender, no hsm Derm: no rash or skin breakdown MSK: normal bulk and tone Neuro: A&Ox4, CN II-XII intact, strength 5/5 in all 4 extremities Psyche: normal mood and affect   Impression: Chronic Cough Mild persistent asthma, well controlled Allergic rhinitis  Plan: Consent today for the [facility] 2 trial Stop elavil for washout Return in 2 weeks for screening visit Continue zyrtec for allergic rhinitis, not for cough Continue albuterol only for chest tightness, wheezing, dyspnea but not for cough, explained this in detail today Continue Tery Sanfilippo, MD Pager: [phone] Cell: [phone] After 3pm or if no response, call [phone]

## 2017-01-04 NOTE — Patient Instructions (Signed)
Continue study protocol 

## 2017-01-10 ENCOUNTER — Ambulatory Visit: Payer: BLUE CROSS/BLUE SHIELD | Admitting: Pulmonary Disease

## 2017-01-24 ENCOUNTER — Telehealth: Payer: Self-pay | Admitting: *Deleted

## 2017-01-24 ENCOUNTER — Ambulatory Visit: Payer: BLUE CROSS/BLUE SHIELD | Admitting: Family

## 2017-01-24 ENCOUNTER — Ambulatory Visit (INDEPENDENT_AMBULATORY_CARE_PROVIDER_SITE_OTHER): Payer: Medicare Other | Admitting: Family

## 2017-01-24 ENCOUNTER — Encounter: Payer: Self-pay | Admitting: Family

## 2017-01-24 VITALS — BP 134/84 | HR 98 | Temp 98.4°F | Resp 16 | Ht 65.0 in | Wt 195.6 lb

## 2017-01-24 DIAGNOSIS — L723 Sebaceous cyst: Secondary | ICD-10-CM | POA: Diagnosis not present

## 2017-01-24 DIAGNOSIS — I10 Essential (primary) hypertension: Secondary | ICD-10-CM | POA: Diagnosis not present

## 2017-01-24 DIAGNOSIS — E042 Nontoxic multinodular goiter: Secondary | ICD-10-CM | POA: Diagnosis not present

## 2017-01-24 DIAGNOSIS — E119 Type 2 diabetes mellitus without complications: Secondary | ICD-10-CM

## 2017-01-24 LAB — COMPREHENSIVE METABOLIC PANEL
ALT: 23 U/L (ref 0–35)
AST: 24 U/L (ref 0–37)
Albumin: 4.4 g/dL (ref 3.5–5.2)
Alkaline Phosphatase: 43 U/L (ref 39–117)
BUN: 8 mg/dL (ref 6–23)
CHLORIDE: 103 meq/L (ref 96–112)
CO2: 31 mEq/L (ref 19–32)
Calcium: 9.3 mg/dL (ref 8.4–10.5)
Creatinine, Ser: 0.63 mg/dL (ref 0.40–1.20)
GFR: 121.93 mL/min (ref >60.00)
GLUCOSE: 122 mg/dL — AB (ref 70–99)
POTASSIUM: 4.4 meq/L (ref 3.5–5.1)
SODIUM: 140 meq/L (ref 135–145)
Total Bilirubin: 0.3 mg/dL (ref 0.2–1.2)
Total Protein: 6.6 g/dL (ref 6.0–8.3)

## 2017-01-24 LAB — TSH: TSH: 0.13 u[IU]/mL — AB (ref 0.35–4.50)

## 2017-01-24 LAB — HEMOGLOBIN A1C: HEMOGLOBIN A1C: 6.7 % — AB (ref 4.6–6.5)

## 2017-01-24 MED ORDER — AMLODIPINE BESYLATE 2.5 MG PO TABS: 2.5000 mg | ORAL_TABLET | Freq: Every day | ORAL | 3 refills | Status: DC

## 2017-01-24 MED ORDER — METFORMIN HCL ER 500 MG PO TB24: ORAL_TABLET | ORAL | 3 refills | Status: DC

## 2017-01-24 NOTE — Assessment & Plan Note (Signed)
mildly elevated. She is also diabetic. Unable to take ACE inhibitor due to chronic cough. We'll start low-dose amlodipine. Patient will monitor blood pressures at home.

## 2017-01-24 NOTE — Progress Notes (Signed)
Subjective:    Patient ID: Laura Underwood, female    DOB: 04-Mar-1951, 66 y.o.   MRN: [id number]  CC: Laura Underwood is a 66 y.o. female who presents today for follow up.   HPI: DM- compliant with metformin. Fasting BS qm 112, 126, 113.  No low blood sugars.   Elevated blood pressure- 120/82, 114/66, 144/74, 139/78.   Denies exertional chest pain or pressure, numbness or tingling radiating to left arm or jaw, palpitations, dizziness, frequent headaches, changes in vision, or shortness of breath.   H/o thyroid nodules, s/o biopsy which was normal per patient. No palpitations, anxiety, difficulty swallowing . Would like a repeat US. Last [place] 2015 and unchanged   Also complains of 'blackhead' in center of chest. It is unchanged in size. It is nontender. It is not draining. No fever, nausea, vomiting.        HISTORY:  Past Medical History:  Diagnosis Date  . Abdominal pain, other specified site   . Arthritis   . Asthma    Dr [place]  . Chronic cough    s/p evaluation by allergist and pulmonologist   . Colon polyp   . Cough   . Diabetes mellitus without complication ([place])   . Diverticulosis   . Dizziness   . GERD (gastroesophageal reflux disease)   . Goiter   . Headache(784.0)   . Hemorrhoids   . Hiatal hernia   . Irritable bowel disease   . Menopause   . Shortness of breath   . Stress   . Wears glasses    Past Surgical History:  Procedure Laterality Date  . BRAVO [place] STUDY N/A 08/15/2013   Procedure: BRAVO [place] STUDY;  Surgeon: Lear Ng, MD;  Location: [facility];  Service: Endoscopy;  Laterality: N/A;  . CESAREAN SECTION  [phone]  . CESAREAN SECTION  [phone]  . CHOLECYSTECTOMY  09/13/14  . COLONOSCOPY N/A 08/15/2013   Procedure: COLONOSCOPY;  Surgeon: Lear Ng, MD;  Location: [facility];  Service: Endoscopy;  Laterality: N/A;  . ESOPHAGOGASTRODUODENOSCOPY N/A 08/15/2013   Procedure: ESOPHAGOGASTRODUODENOSCOPY (EGD);  Surgeon: Lear Ng, MD;  Location: [facility];  Service: Endoscopy;  Laterality: N/A;  . jaw abscess  jan 2012  . LAPAROSCOPY  1981  . ovarin cyst  2004   ovary removed   Family History  Problem Relation Age of Onset  . Stroke Mother   . Hypertension Mother   . Hyperlipidemia Mother   . Depression Mother   . Hypertension Sister   . Prostate cancer Father   . Liver cancer Father        metastatic liver  . Breast cancer Maternal Aunt   . Depression Maternal Aunt   . Depression Maternal Grandfather   . Depression Cousin   . Pancreatic cancer Paternal Aunt   . Pancreatic cancer Paternal Grandmother     Allergies: Latex; Nickel; Benzalkonium chloride; and Neosporin [neomycin-bacitracin zn-polymyx] Current Outpatient Prescriptions on File Prior to Visit  Medication Sig Dispense Refill  . albuterol (PROVENTIL HFA;VENTOLIN HFA) 108 (90 BASE) MCG/ACT inhaler Inhale 2 puffs into the lungs every 4 (four) hours as needed for wheezing or shortness of breath. 1 Inhaler 6  . aspirin 81 MG tablet Take 81 mg by mouth daily.    . cetirizine (ZYRTEC) 10 MG tablet Take 1 mg by mouth daily.    . Cholecalciferol (VITAMIN D PO) Take by mouth daily.    . Cinnamon 500 MG capsule Take 500 mg by  mouth daily.    Marland Kitchen docusate sodium (COLACE) 100 MG capsule Take 100 mg by mouth 2 (two) times daily.    . folic acid (FOLVITE) 553 MCG tablet Take 400 mcg by mouth daily.    . hydroxypropyl methylcellulose / hypromellose (ISOPTO TEARS / GONIOVISC) 2.5 % ophthalmic solution Place 1 drop into both eyes as needed for dry eyes.    . Misc Natural Products (TART CHERRY ADVANCED PO) Take 1,200 mg by mouth daily.    . Multiple Vitamin (MULTIVITAMIN) tablet Take 1 tablet by mouth daily.    . Omega-3 Fatty Acids (FISH OIL) 1200 MG CAPS Take by mouth.    . TURMERIC PO Take by mouth.    . VENTOLIN HFA 108 (90 Base) MCG/ACT inhaler INHALE 2 PUFFS INTO THE LUNGS EVERY 4 HOURS AS NEEDED FOR WHEEZING OR SHORTNESS OF BREATH 18 g 5   No  current facility-administered medications on file prior to visit.     Social History  Substance Use Topics  . Smoking status: Never Smoker  . Smokeless tobacco: Never Used  . Alcohol use 0.0 oz/week    1 - 2 Cans of beer per week     Comment: rarely    Review of Systems  Constitutional: Negative for chills and fever.  HENT: Negative for trouble swallowing.   Eyes: Negative for visual disturbance.  Respiratory: Positive for cough (chronic). Negative for shortness of breath.   Cardiovascular: Negative for chest pain and palpitations.  Gastrointestinal: Negative for nausea and vomiting.  Skin: Positive for rash.  Neurological: Negative for headaches.      Objective:    BP 134/84 (BP Location: Left Arm, Patient Position: Sitting, Cuff Size: Normal)   Pulse 98   Temp 98.4 F (36.9 C) (Oral)   Resp 16   Ht 5\' 5"  (1.651 m)   Wt 195 lb 9.6 oz (88.7 kg)   SpO2 99%   BMI 32.55 kg/m  BP Readings from Last 3 Encounters:  01/24/17 134/84  10/18/16 138/90  07/15/16 (!) 148/88   Wt Readings from Last 3 Encounters:  01/24/17 195 lb 9.6 oz (88.7 kg)  10/18/16 199 lb 12.8 oz (90.6 kg)  07/15/16 197 lb 3.2 oz (89.4 kg)    Physical Exam  Constitutional: She appears well-developed and well-nourished.  Eyes: Conjunctivae are normal.  Neck: No thyroid mass and no thyromegaly present.  Cardiovascular: Normal rate, regular rhythm, normal heart sounds and normal pulses.   Pulmonary/Chest: Effort normal and breath sounds normal. She has no wheezes. She has no rhonchi. She has no rales.  Neurological: She is alert.  Skin: Skin is warm and dry. Rash noted. Rash is macular.     Black-colored macule on chest as noted on diagram. Nonfluctuant, nontender.  Psychiatric: She has a normal mood and affect. Her speech is normal and behavior is normal. Thought content normal.  Vitals reviewed.      Assessment & Plan:   Problem List Items Addressed This Visit      Cardiovascular and  Mediastinum   HTN (hypertension)    mildly elevated. She is also diabetic. Unable to take ACE inhibitor due to chronic cough. We'll start low-dose amlodipine. Patient will monitor blood pressures at home.      Relevant Medications   amLODipine (NORVASC) 2.5 MG tablet   Other Relevant Orders   Comprehensive metabolic panel     Endocrine   Goiter, nontoxic, multinodular    Clinically asymptomatic. Pending ultrasound, thyroid studies.  Relevant Orders   TSH   US THYROID   Diabetes mellitus type 2, controlled ([place]) - Primary    Controlled.  Pending A1c      Relevant Medications   metFORMIN (GLUCOPHAGE XR) 500 MG 24 hr tablet   Other Relevant Orders   Hemoglobin A1c     Musculoskeletal and Integument   Sebaceous cyst    Afebrile. Nonfluctuant. Symptoms most consistent with sebaceous cyst. Referral dermatology for further evaluation, removal      Relevant Orders   Ambulatory referral to Dermatology       I have discontinued Ms. Marsch's amitriptyline and BREO ELLIPTA. I am also having her start on amLODipine. Additionally, I am having her maintain her Fish Oil, multivitamin, folic acid, Misc Natural Products (TART CHERRY ADVANCED PO), aspirin, docusate sodium, Cholecalciferol (VITAMIN D PO), hydroxypropyl methylcellulose / hypromellose, albuterol, TURMERIC PO, Cinnamon, cetirizine, VENTOLIN HFA, and metFORMIN.   Meds ordered this encounter  Medications  . metFORMIN (GLUCOPHAGE XR) 500 MG 24 hr tablet    Sig: Start 500mg  PO qpm.    Dispense:  90 tablet    Refill:  3    Order Specific Question:   Supervising Provider    Answer:   Deborra Medina L [[id number]]  . amLODipine (NORVASC) 2.5 MG tablet    Sig: Take 1 tablet (2.5 mg total) by mouth daily.    Dispense:  90 tablet    Refill:  3    Order Specific Question:   Supervising Provider    Answer:   Crecencio Mc [[id number]]    Return precautions given.   Risks, benefits, and alternatives of the medications and treatment  plan prescribed today were discussed, and patient expressed understanding.   Education regarding symptom management and diagnosis given to patient on AVS.  Continue to follow with Burnard Hawthorne, FNP for routine health maintenance.   Laura Underwood and I agreed with plan.   Mable Paris, FNP

## 2017-01-24 NOTE — Assessment & Plan Note (Addendum)
Controlled.  Pending A1c

## 2017-01-24 NOTE — Telephone Encounter (Signed)
Pt requested to have all medications from today's visit sent to [facility] on [address]

## 2017-01-24 NOTE — Assessment & Plan Note (Signed)
Clinically asymptomatic. Pending ultrasound, thyroid studies.

## 2017-01-24 NOTE — Assessment & Plan Note (Signed)
Afebrile. Nonfluctuant. Symptoms most consistent with sebaceous cyst. Referral dermatology for further evaluation, removal

## 2017-01-24 NOTE — Patient Instructions (Signed)
Start amlodipine low-dose Labs today Thyroid ultrasound Follow-up in 2-3 months, sooner if blood pressures not less than 130/80.   Managing Your Hypertension Hypertension is commonly called high blood pressure. This is when the force of your blood pressing against the walls of your arteries is too strong. Arteries are blood vessels that carry blood from your heart throughout your body. Hypertension forces the heart to work harder to pump blood, and may cause the arteries to become narrow or stiff. Having untreated or uncontrolled hypertension can cause heart attack, stroke, kidney disease, and other problems. What are blood pressure readings? A blood pressure reading consists of a higher number over a lower number. Ideally, your blood pressure should be below 120/80. The first ("top") number is called the systolic pressure. It is a measure of the pressure in your arteries as your heart beats. The second ("bottom") number is called the diastolic pressure. It is a measure of the pressure in your arteries as the heart relaxes. What does my blood pressure reading mean? Blood pressure is classified into four stages. Based on your blood pressure reading, your health care provider may use the following stages to determine what type of treatment you need, if any. Systolic pressure and diastolic pressure are measured in a unit called mm Hg. Normal  Systolic pressure: below 751.  Diastolic pressure: below 80. Elevated  Systolic pressure: 025-852.  Diastolic pressure: below 80. Hypertension stage 1  Systolic pressure: 778-242.  Diastolic pressure: 35-36. Hypertension stage 2  Systolic pressure: 144 or above.  Diastolic pressure: 90 or above. What health risks are associated with hypertension? Managing your hypertension is an important responsibility. Uncontrolled hypertension can lead to:  A heart attack.  A stroke.  A weakened blood vessel (aneurysm).  Heart failure.  Kidney  damage.  Eye damage.  Metabolic syndrome.  Memory and concentration problems.  What changes can I make to manage my hypertension? Hypertension can be managed by making lifestyle changes and possibly by taking medicines. Your health care provider will help you make a plan to bring your blood pressure within a normal range. Eating and drinking  Eat a diet that is high in fiber and potassium, and low in salt (sodium), added sugar, and fat. An example eating plan is called the DASH (Dietary Approaches to Stop Hypertension) diet. To eat this way: ? Eat plenty of fresh fruits and vegetables. Try to fill half of your plate at each meal with fruits and vegetables. ? Eat whole grains, such as whole wheat pasta, brown rice, or whole grain bread. Fill about one quarter of your plate with whole grains. ? Eat low-fat diary products. ? Avoid fatty cuts of meat, processed or cured meats, and poultry with skin. Fill about one quarter of your plate with lean proteins such as fish, chicken without skin, beans, eggs, and tofu. ? Avoid premade and processed foods. These tend to be higher in sodium, added sugar, and fat.  Reduce your daily sodium intake. Most people with hypertension should eat less than 1,500 mg of sodium a day.  Limit alcohol intake to no more than 1 drink a day for nonpregnant women and 2 drinks a day for men. One drink equals 12 oz of beer, 5 oz of wine, or 1 oz of hard liquor. Lifestyle  Work with your health care provider to maintain a healthy body weight, or to lose weight. Ask what an ideal weight is for you.  Get at least 30 minutes of exercise that causes your  heart to beat faster (aerobic exercise) most days of the week. Activities may include walking, swimming, or biking.  Include exercise to strengthen your muscles (resistance exercise), such as weight lifting, as part of your weekly exercise routine. Try to do these types of exercises for 30 minutes at least 3 days a  week.  Do not use any products that contain nicotine or tobacco, such as cigarettes and e-cigarettes. If you need help quitting, ask your health care provider.  Control any long-term (chronic) conditions you have, such as high cholesterol or diabetes. Monitoring  Monitor your blood pressure at home as told by your health care provider. Your personal target blood pressure may vary depending on your medical conditions, your age, and other factors.  Have your blood pressure checked regularly, as often as told by your health care provider. Working with your health care provider  Review all the medicines you take with your health care provider because there may be side effects or interactions.  Talk with your health care provider about your diet, exercise habits, and other lifestyle factors that may be contributing to hypertension.  Visit your health care provider regularly. Your health care provider can help you create and adjust your plan for managing hypertension. Will I need medicine to control my blood pressure? Your health care provider may prescribe medicine if lifestyle changes are not enough to get your blood pressure under control, and if:  Your systolic blood pressure is 130 or higher.  Your diastolic blood pressure is 80 or higher.  Take medicines only as told by your health care provider. Follow the directions carefully. Blood pressure medicines must be taken as prescribed. The medicine does not work as well when you skip doses. Skipping doses also puts you at risk for problems. Contact a health care provider if:  You think you are having a reaction to medicines you have taken.  You have repeated (recurrent) headaches.  You feel dizzy.  You have swelling in your ankles.  You have trouble with your vision. Get help right away if:  You develop a severe headache or confusion.  You have unusual weakness or numbness, or you feel faint.  You have severe pain in your chest  or abdomen.  You vomit repeatedly.  You have trouble breathing. Summary  Hypertension is when the force of blood pumping through your arteries is too strong. If this condition is not controlled, it may put you at risk for serious complications.  Your personal target blood pressure may vary depending on your medical conditions, your age, and other factors. For most people, a normal blood pressure is less than 120/80.  Hypertension is managed by lifestyle changes, medicines, or both. Lifestyle changes include weight loss, eating a healthy, low-sodium diet, exercising more, and limiting alcohol. This information is not intended to replace advice given to you by your health care provider. Make sure you discuss any questions you have with your health care provider. Document Released: 02/23/2012 Document Revised: 04/28/2016 Document Reviewed: 04/28/2016 Elsevier Interactive Patient Education  [employer].

## 2017-01-24 NOTE — Telephone Encounter (Signed)
medication has been sent to correct pharmacy.

## 2017-01-25 ENCOUNTER — Other Ambulatory Visit: Payer: Self-pay | Admitting: Family

## 2017-01-25 DIAGNOSIS — R7989 Other specified abnormal findings of blood chemistry: Secondary | ICD-10-CM

## 2017-01-27 ENCOUNTER — Other Ambulatory Visit (INDEPENDENT_AMBULATORY_CARE_PROVIDER_SITE_OTHER): Payer: Medicare Other

## 2017-01-27 DIAGNOSIS — R7989 Other specified abnormal findings of blood chemistry: Secondary | ICD-10-CM

## 2017-01-27 DIAGNOSIS — R946 Abnormal results of thyroid function studies: Secondary | ICD-10-CM | POA: Diagnosis not present

## 2017-01-27 LAB — T3, FREE: T3 FREE: 4.3 pg/mL — AB (ref 2.3–4.2)

## 2017-01-27 LAB — TSH: TSH: 0.18 u[IU]/mL — ABNORMAL LOW (ref 0.35–4.50)

## 2017-01-27 LAB — T4, FREE: FREE T4: 0.88 ng/dL (ref 0.60–1.60)

## 2017-01-28 ENCOUNTER — Encounter (INDEPENDENT_AMBULATORY_CARE_PROVIDER_SITE_OTHER): Payer: Medicare Other | Admitting: Adult Health

## 2017-01-28 ENCOUNTER — Other Ambulatory Visit: Payer: Self-pay | Admitting: Internal Medicine

## 2017-01-28 ENCOUNTER — Encounter: Payer: Self-pay | Admitting: Adult Health

## 2017-01-28 VITALS — BP 116/78 | HR 69 | Temp 98.0°F | Resp 14

## 2017-01-28 DIAGNOSIS — R05 Cough: Secondary | ICD-10-CM

## 2017-01-28 DIAGNOSIS — Z006 Encounter for examination for normal comparison and control in clinical research program: Secondary | ICD-10-CM

## 2017-01-28 DIAGNOSIS — R053 Chronic cough: Secondary | ICD-10-CM

## 2017-01-28 NOTE — Progress Notes (Signed)
Patient not seen by Rexene Edison NP in office today d/t equipment malfunction No charge Will close encounter

## 2017-01-28 NOTE — Progress Notes (Unsigned)
Title: A Double- Blind, Randomized, Placebo Controlled Study of the Efficacy and Safety of Three Doses of Orvepitant in Subjects With Chronic Refractory Cough Sponsor: [employer]; Protocol Number: VOLCANO-2;  NCT Trial #: [id number]; Phase of Development: Phase 2  Synopsis: Randomized subjects 1:1:1:1 entering a three week screening period and randomized at baseline/day 1 and followed throughout a 12 week double blind dosing. Doses of orvepitant (10mg/day, 20mg/day and 30mg/day) or a placebo will be administered and evaluated in the reduction of awake objective cough frequency  Key Inclusion Criteria:  Awake cough frequency of greater than/equal to 10 coughs/hour Female/Female subjects greater than/equal to 18 yrs. Diagnosis of CRC/unexplained cough for at least 1 year Non pregnant/lactating females Two forms of birth control- for female and female subjects  Key Exclusion Criteria: Current smokers or ex smokers within 6 months Recent respiratory tract infection Malignancy in past 5 years except if in remission or skin cancers unless approved by sponsor Treatment with Angiotensin Converting Enzyme inhibitors within 3 months of screening History of drug/alcohol abuse within 12 months of screening Hx of cystic fibrosis,idiopathic pulmonary fibrosis, moderate-severe asthma, bronchiectasis, COPD, CHF Significant or unstable psychiatric condition Renal transplant, current dialysis, or Hx of renal tubular acidosis -No GFR/Cr cut off noted Uncontrolled hypertension at screening Significant neurological disorder- **prior PMH or increased risk of seizures (not febrile), or recent (within 6 months) of head trauma or loss of consciousness or concussion Recent myocardial infarction (within 1 year of screening) drug use taken solely for treatment of cough prohibited from at least 1 month prior to screening) Concomitant respiratory medication is allowed, but subjects must be stable on medication  and take it for the duration of the study IMPORTANT MEDICATIONS PATIENT CAN NOT BE ON: grapefruit juice, St. John's Wort, digoxin, diltiazem, fluconazole, verapamil, ciprofloaxcin, amiodarone, carvedilol, azithromycin ---> All from Screening until 1 week after LAST dose  Key End Points: Change from baseline to week 12 in awake objective cough frequency measured with an automated cough monitor (Vitalojak) using 10mg, 20mg, 30mg orvepitant versus placebo  Key features of Volcano 2: Orvepitant is a centrally active, highly potent and selective NK(neurokinin)-1 receptor antagonist, recognized of its potential therapy for refractory cough Orvepitant was originally investigated for Major Depressive Disorder and PTSD Half Life of 7-15 hrs after single dose and increases to 34 hours after repeated dosing  Safety of Volcano 2 based on IB Version 6.0 and Date 09 Mar 2018__: Orvepitant has been administered in phase 2 studies to 557 patients to date- generally safe and well tolerated  A top dose of 30mg has been chosen based on efficacy/safety in Volcano 1 Monitored with assessments via cough monitor, lab work (no fasting needed), EKG, cough questionnaires, spirometry, physical exams, vital signs and adverse event reporting Fatigue, Dizziness, dry mouth, nausea and palpitations were most common AEs reported As of 20 Aug 2016 one death has occured- not considered related to study drug (pg110 in IB) ?  Study [id number]: Adverse Events Reported of Subjects in Any Treatment Group (Preferred Terms) Table 5.4-4,5 in IB   Placebo Orevipitant 10mg        30mg          60mg  > 5% side effect    Headache 9% 0%            9%             9%  Diarrhea 5%  0%            6%               6%  Somnolence 3%  0%            9%             9%  Nausea 8%  0%             5%            5%  Dry Mouth 8% 0%             7%             7%  Fatigue 2% 0%             4%            6%  Palpitations 1%  0%             3%             1%      Rare but important: Convulsions    Seen in (3) subjects taking 60mg /day- dose has been eliminated   Clinical Research officer, political party / Research RN note : This visit for Subject Laura Underwood with DOB: January 03, 1951 on 01/28/2017 for the above protocol was intended to be Screening Visit. Subject arrived at approximately 0745. Medical History and Conmeds reviewed. Laboratory assessments completed per protocol requirements. Refer to the subjects paper source binder for further details. The visit could not be completed further past this point due to a malfunction with the EKG and Spirometry Software. IT department was consulted, but they were not able to repair the issue at the time of the visit. This coordinator consulted with the study monitor and with sub-I Dr. Chase Caller (due to [place] being on vacation) and the decision was made to reschedule the visit to to Monday August 20th when the equipment will be repaired. The subject stated understanding of the plan and was thanked for her participation in research.     Signed by  [place] Bing, [place] Coordinator [employer]  [place], [place] 4:47 PM 01/28/2017

## 2017-01-28 NOTE — Progress Notes (Signed)
Title: A Double- Blind, Randomized, Placebo Controlled Study of the Efficacy and Safety of Three Doses of Orvepitant in Subjects With Chronic Refractory Cough Sponsor: [employer]; Protocol Number: VOLCANO-2;  NCT Trial #: [id number]; Phase of Development: Phase 2  Synopsis: Randomized subjects 1:1:1:1 entering a three week screening period and randomized at baseline/day 1 and followed throughout a 12 week double blind dosing. Doses of orvepitant (10mg/day, 20mg/day and 30mg/day) or a placebo will be administered and evaluated in the reduction of awake objective cough frequency  Key Inclusion Criteria:  Awake cough frequency of greater than/equal to 10 coughs/hour Female/Female subjects greater than/equal to 18 yrs. Diagnosis of CRC/unexplained cough for at least 1 year Non pregnant/lactating females Two forms of birth control- for female and female subjects  Key Exclusion Criteria: Current smokers or ex smokers within 6 months Recent respiratory tract infection Malignancy in past 5 years except if in remission or skin cancers unless approved by sponsor Treatment with Angiotensin Converting Enzyme inhibitors within 3 months of screening History of drug/alcohol abuse within 12 months of screening Hx of cystic fibrosis,idiopathic pulmonary fibrosis, moderate-severe asthma, bronchiectasis, COPD, CHF Significant or unstable psychiatric condition Renal transplant, current dialysis, or Hx of renal tubular acidosis -No GFR/Cr cut off noted Uncontrolled hypertension at screening Significant neurological disorder- **prior PMH or increased risk of seizures (not febrile), or recent (within 6 months) of head trauma or loss of consciousness or concussion Recent myocardial infarction (within 1 year of screening) drug use taken solely for treatment of cough prohibited from at least 1 month prior to screening) Concomitant respiratory medication is allowed, but subjects must be stable on medication  and take it for the duration of the study IMPORTANT MEDICATIONS PATIENT CAN NOT BE ON: grapefruit juice, St. John's Wort, digoxin, diltiazem, fluconazole, verapamil, ciprofloaxcin, amiodarone, carvedilol, azithromycin ---> All from Screening until 1 week after LAST dose  Key End Points: Change from baseline to week 12 in awake objective cough frequency measured with an automated cough monitor (Vitalojak) using 10mg, 20mg, 30mg orvepitant versus placebo  Key features of Volcano 2: Orvepitant is a centrally active, highly potent and selective NK(neurokinin)-1 receptor antagonist, recognized of its potential therapy for refractory cough Orvepitant was originally investigated for Major Depressive Disorder and PTSD Half Life of 7-15 hrs after single dose and increases to 34 hours after repeated dosing  Safety of Volcano 2 based on IB Version 6.0 and Date 09 Mar 2018__: Orvepitant has been administered in phase 2 studies to 557 patients to date- generally safe and well tolerated  A top dose of 30mg has been chosen based on efficacy/safety in Volcano 1 Monitored with assessments via cough monitor, lab work (no fasting needed), EKG, cough questionnaires, spirometry, physical exams, vital signs and adverse event reporting Fatigue, Dizziness, dry mouth, nausea and palpitations were most common AEs reported As of 20 Aug 2016 one death has occured- not considered related to study drug (pg110 in IB) ?  Study [id number]: Adverse Events Reported of Subjects in Any Treatment Group (Preferred Terms) Table 5.4-4,5 in IB   Placebo Orevipitant 10mg        30mg          60mg  > 5% side effect    Headache 9% 0%            9%             9%  Diarrhea 5%  0%            6%               6%  Somnolence 3%  0%            9%             9%  Nausea 8%  0%             5%            5%  Dry Mouth 8% 0%             7%             7%  Fatigue 2% 0%             4%            6%  Palpitations 1%  0%             3%             1%      Rare but important: Convulsions    Seen in (3) subjects taking 60mg/day- dose has been eliminated                                 

## 2017-01-31 ENCOUNTER — Encounter: Payer: Self-pay | Admitting: Internal Medicine

## 2017-01-31 ENCOUNTER — Encounter (INDEPENDENT_AMBULATORY_CARE_PROVIDER_SITE_OTHER): Payer: Medicare Other | Admitting: Internal Medicine

## 2017-01-31 ENCOUNTER — Ambulatory Visit
Admission: RE | Admit: 2017-01-31 | Discharge: 2017-01-31 | Disposition: A | Discharge status: Home / Self Care | Payer: Medicare Other | Source: Ambulatory Visit | Attending: Family | Admitting: Family

## 2017-01-31 VITALS — BP 120/80 | HR 66 | Temp 97.9°F | Resp 12 | Ht 65.0 in | Wt 193.6 lb

## 2017-01-31 DIAGNOSIS — R053 Chronic cough: Secondary | ICD-10-CM

## 2017-01-31 DIAGNOSIS — E042 Nontoxic multinodular goiter: Secondary | ICD-10-CM | POA: Diagnosis not present

## 2017-01-31 DIAGNOSIS — R05 Cough: Secondary | ICD-10-CM

## 2017-01-31 DIAGNOSIS — Z006 Encounter for examination for normal comparison and control in clinical research program: Secondary | ICD-10-CM

## 2017-01-31 LAB — THYROTROPIN RECEPTOR AUTOABS: Thyrotropin Receptor Ab: <6 % (ref <=16.0)

## 2017-01-31 IMAGING — US US THYROID
1 series · 12 of 25 positions shown · non-contrast
Comparison: 01/01/2014, 12/20/2012

CLINICAL DATA: Thyroid nodules

EXAM:
THYROID ULTRASOUND
TECHNIQUE: Ultrasound examination of the thyroid gland and adjacent soft
tissues was performed.

[Series 1: us thyroid · 0.07mm/px · 12 of 95 slices shown]
[im 4/95]
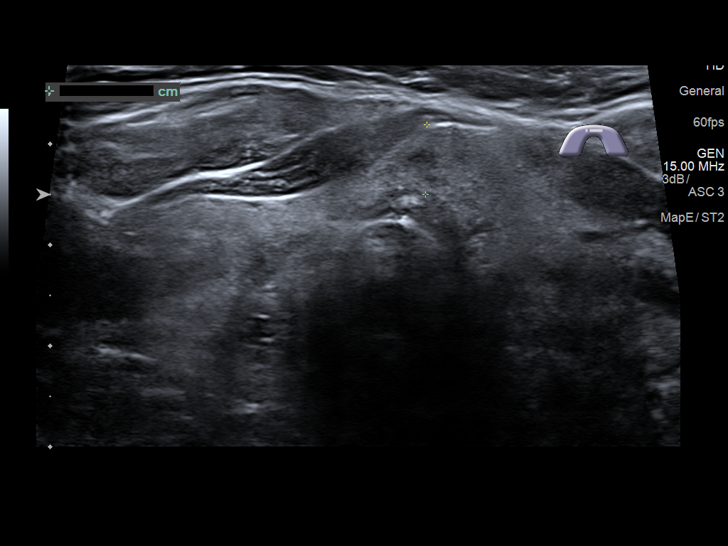
[im 12/95]
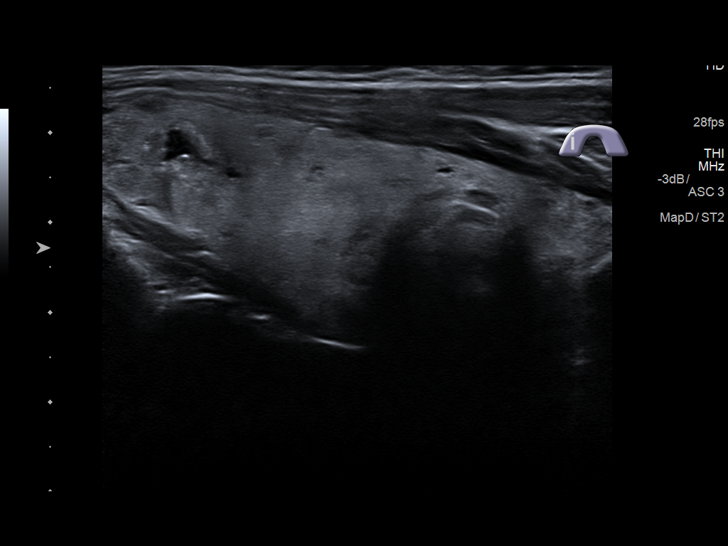
[im 20/95]
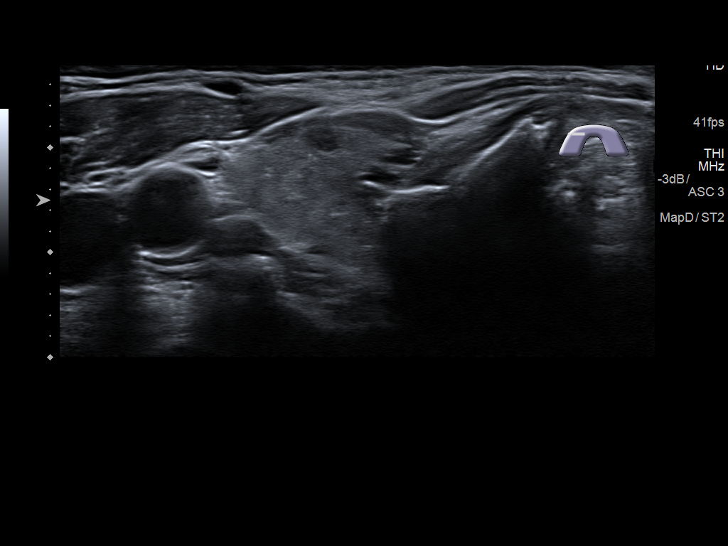
[im 28/95]
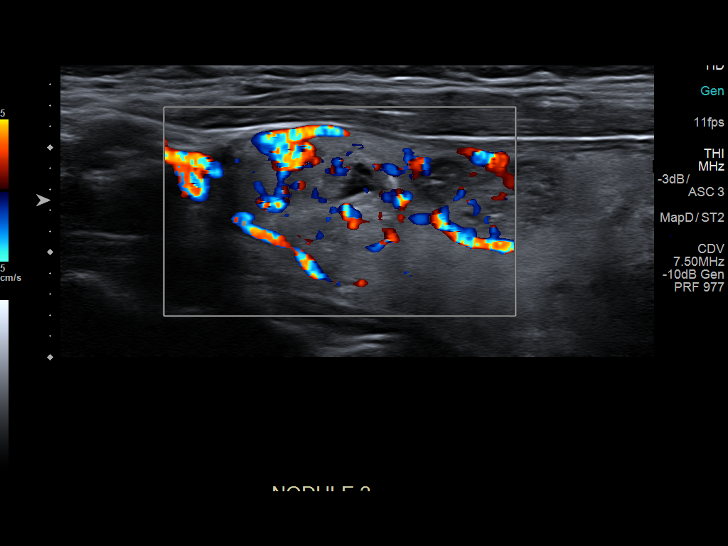
[im 36/95]
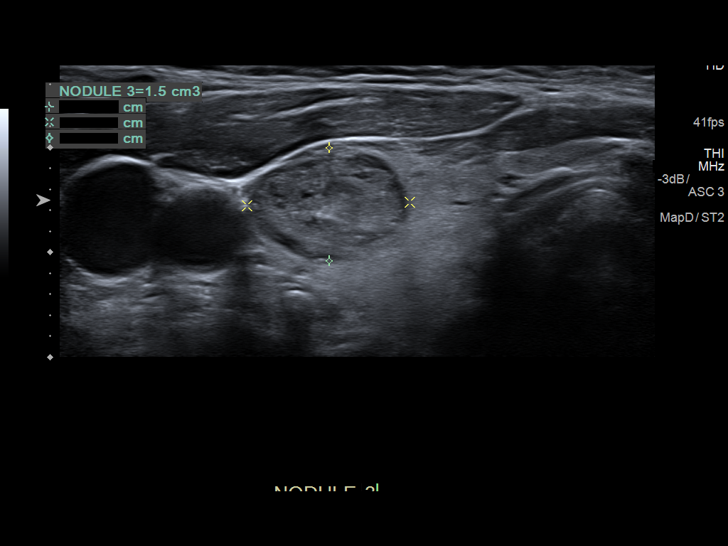
[im 44/95]
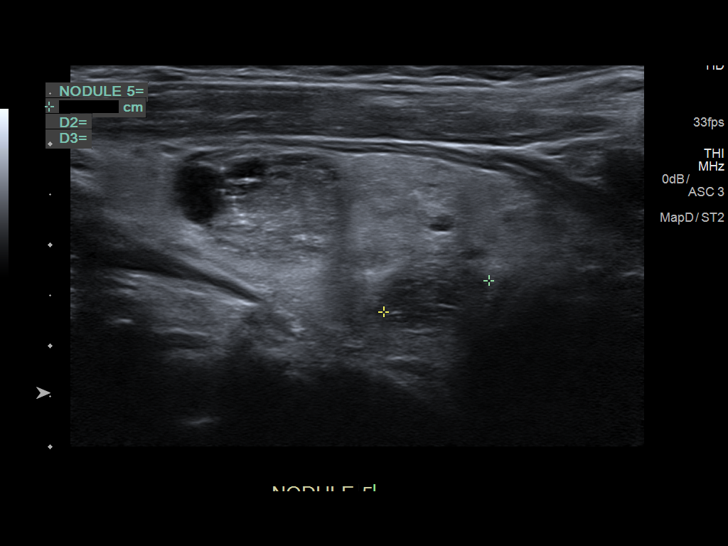
[im 51/95]
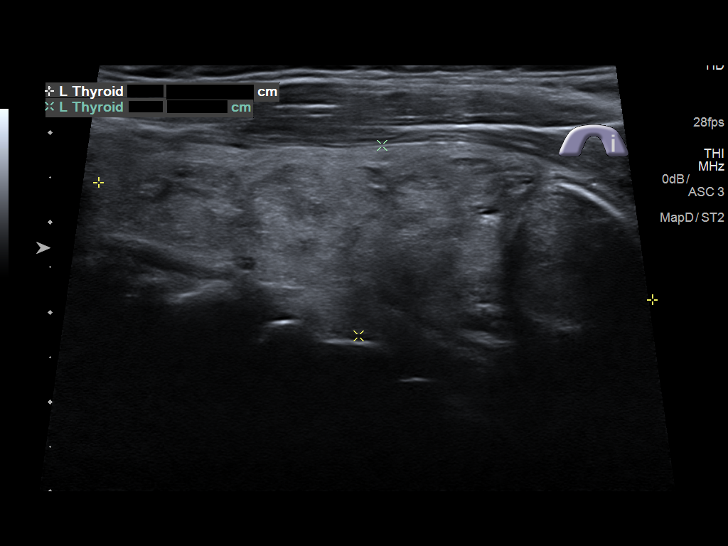
[im 59/95]
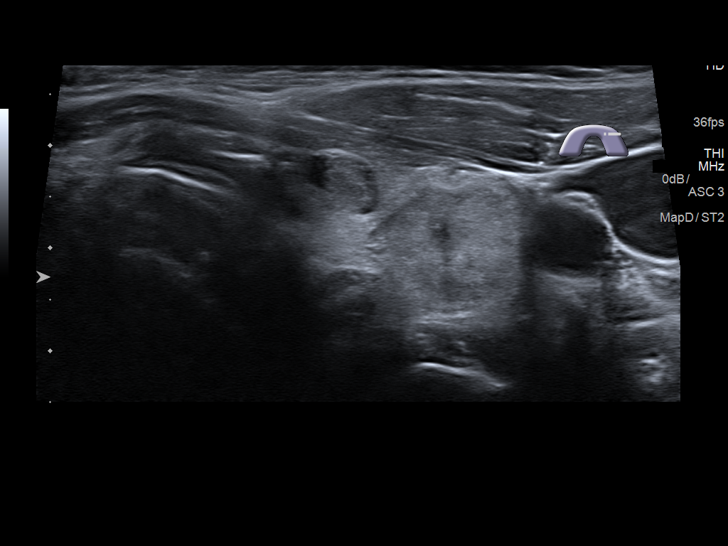
[im 67/95]
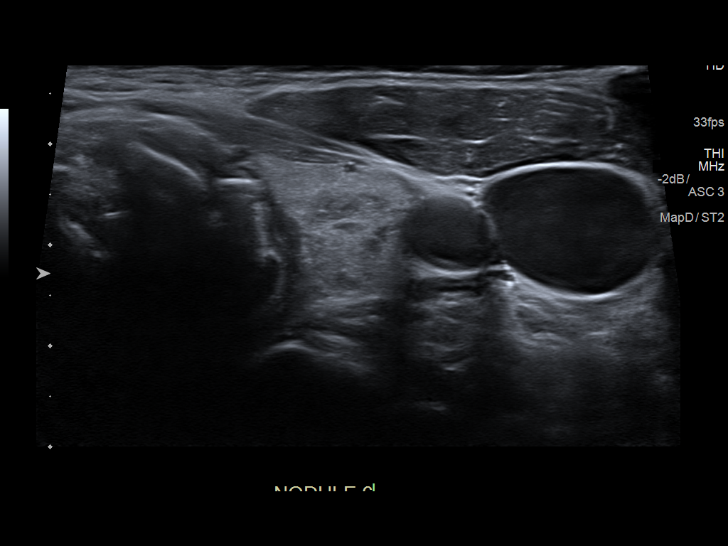
[im 75/95]
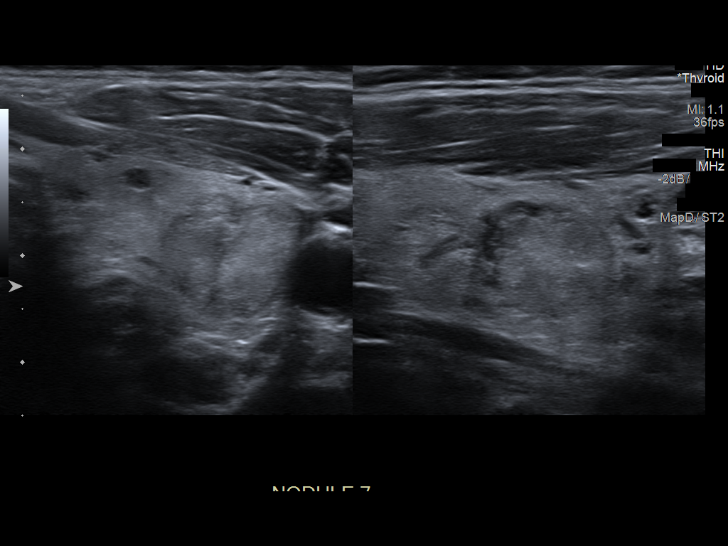
[im 83/95]
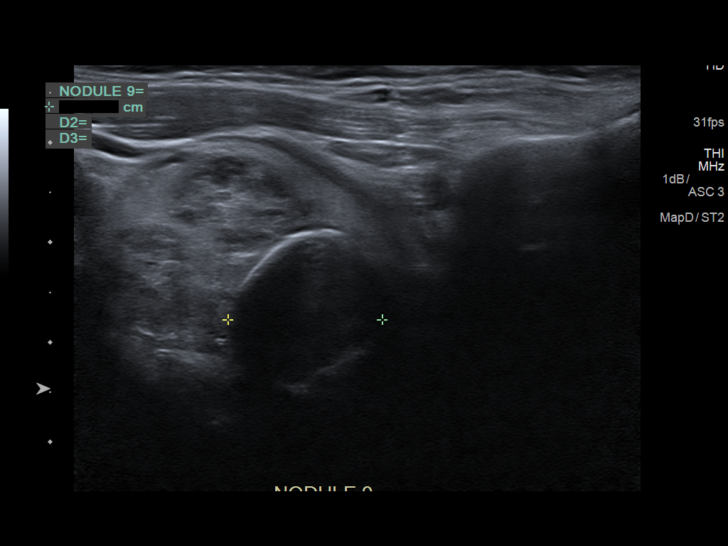
[im 91/95]
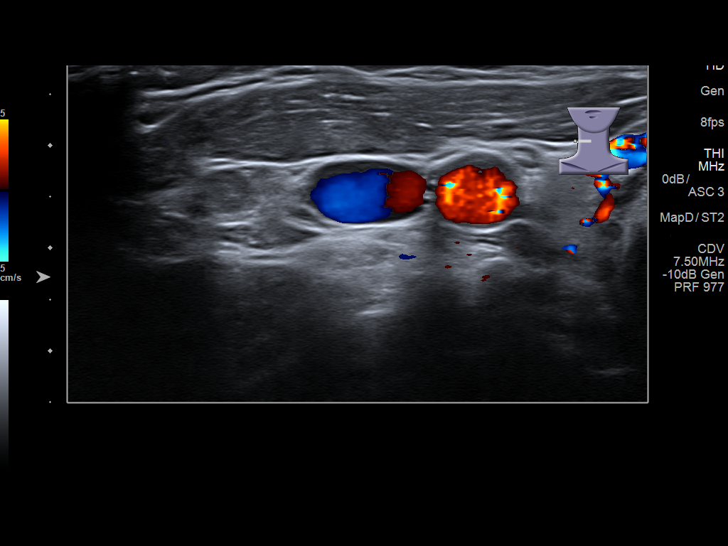

[12 of 25 positions shown; findings below may reference images not displayed]

FINDINGS: Parenchymal Echotexture: Moderately heterogenous

Isthmus: 7 mm, previously 3 mm

Right lobe: 6.1 x 2.1 x 2.0 cm, previously 5.9 x 1.8 x 2.2 cm

Left lobe: 6.3 x 2.1 x 2.5 cm, previously 6.9 x 2.0 x 2.0 cm

_________________________________________________________

Estimated total number of nodules >/= 1 cm: 6-10

Number of spongiform nodules >/=  2 cm not described below (TR1): 0

Number of mixed cystic and solid nodules >/= 1.5 cm not described
below (TR2): 0

_________________________________________________________

Nodule # 2:

Location: Right; Superior

Maximum size: 1.5, previously 1.3 cm; Other 2 dimensions: 1.4 x 1.2,
previously 1.0 x 1.1 cm

Composition: mixed cystic and solid (1)

Echogenicity: isoechoic (1)

Shape: taller-than-wide (3)

Margins: ill-defined (0)

Echogenic foci: none (0)

ACR TI-RADS total points: 5.

ACR TI-RADS risk category: TR4 (4-6 points).

ACR TI-RADS recommendations:

**Given size (>/= 1.5 cm) and appearance, fine needle aspiration of
this moderately suspicious nodule should be considered based on
TI-RADS criteria.

_________________________________________________________

Nodule # 3:

Location: Right; Mid

Maximum size: 1.8, previously 1.5 cm; Other 2 dimensions: 1.6 x 1.1,
previously 1.1 x 1.2 cm

Composition: mixed cystic and solid (1)

Echogenicity: isoechoic (1)

Shape: not taller-than-wide (0)

Margins: smooth (0)

Echogenic foci: large comet-tail artifacts (0)

ACR TI-RADS total points: 2.

ACR TI-RADS risk category: TR2 (2 points).

ACR TI-RADS recommendations:

This nodule does NOT meet TI-RADS criteria for biopsy or dedicated
follow-up.

_________________________________________________________

Nodule # 4:

Prior biopsy: Yes, 12/20/2012

Location: Right; Inferior

Maximum size: 1.6, unchanged cm; Other 2 dimensions: 1.4 x 1 4 cm,
previously, 1.1 x 1.3 cm

Composition: solid/almost completely solid (2)

Echogenicity: isoechoic (1)

Shape: not taller-than-wide (0)

Margins: smooth (0)

Echogenic foci: none (0)

ACR TI-RADS total points: 3.

ACR TI-RADS risk category:  TR3 (3 points).

Significant change in size (>/= 20% in two dimensions and minimal
increase of 2 mm): No

Change in features: No

Change in ACR TI-RADS risk category: No

ACR TI-RADS recommendations:

*Given size (>/= 1.5 - 2.4 cm) and appearance, a follow-up
ultrasound in 1 year should be considered based on TI-RADS criteria.

_________________________________________________________

_________________________________________________________

Nodule # 8:

Location: Left; Mid

Maximum size: 2.3, previously 1.5 cm; Other 2 dimensions: 2.1 x 1.8,
previously 1.0 x 1.3 cm

Composition: solid/almost completely solid (2)

Echogenicity: isoechoic (1)

Shape: not taller-than-wide (0)

Margins: ill-defined (0)

Echogenic foci: none (0)

ACR TI-RADS total points: 3.

ACR TI-RADS risk category: TR3 (3 points).

ACR TI-RADS recommendations:

*Given size (>/= 1.5 - 2.4 cm) and appearance, a follow-up
ultrasound in 1 year should be considered based on TI-RADS criteria.

_________________________________________________________

There are additional bilateral complex mixed cystic and solid
nodules noted which are not fully described by TI rads criteria.
Dominant 4 lesions are detailed as above.
IMPRESSION: 1.5 cm right superior TR 4 nodule meets criteria for biopsy as
above.

Additional bilateral TR 3 nodules as detailed above meet criteria
for follow-up in 1 year.

The above is in keeping with the ACR TI-RADS recommendations - [HOSPITAL] 2345;[DATE].

## 2017-01-31 NOTE — Progress Notes (Signed)
Title: A Double- Blind, Randomized, Placebo Controlled Study of the Efficacy and Safety of Three Doses of Orvepitant in Subjects With Chronic Refractory Cough Sponsor: [employer]; Protocol Number: VOLCANO-2;  NCT Trial #: [id number]; Phase of Development: Phase 2  Synopsis: Randomized subjects 1:1:1:1 entering a three week screening period and randomized at baseline/day 1 and followed throughout a 12 week double blind dosing. Doses of orvepitant (10mg/day, 20mg/day and 30mg/day) or a placebo will be administered and evaluated in the reduction of awake objective cough frequency  Key Inclusion Criteria:  Awake cough frequency of greater than/equal to 10 coughs/hour Female/Female subjects greater than/equal to 18 yrs. Diagnosis of CRC/unexplained cough for at least 1 year Non pregnant/lactating females Two forms of birth control- for female and female subjects  Key Exclusion Criteria: Current smokers or ex smokers within 6 months Recent respiratory tract infection Malignancy in past 5 years except if in remission or skin cancers unless approved by sponsor Treatment with Angiotensin Converting Enzyme inhibitors within 3 months of screening History of drug/alcohol abuse within 12 months of screening Hx of cystic fibrosis,idiopathic pulmonary fibrosis, moderate-severe asthma, bronchiectasis, COPD, CHF Significant or unstable psychiatric condition Renal transplant, current dialysis, or Hx of renal tubular acidosis -No GFR/Cr cut off noted Uncontrolled hypertension at screening Significant neurological disorder- **prior PMH or increased risk of seizures (not febrile), or recent (within 6 months) of head trauma or loss of consciousness or concussion Recent myocardial infarction (within 1 year of screening) drug use taken solely for treatment of cough prohibited from at least 1 month prior to screening) Concomitant respiratory medication is allowed, but subjects must be stable on medication  and take it for the duration of the study IMPORTANT MEDICATIONS PATIENT CAN NOT BE ON: grapefruit juice, St. John's Wort, digoxin, diltiazem, fluconazole, verapamil, ciprofloaxcin, amiodarone, carvedilol, azithromycin ---> All from Screening until 1 week after LAST dose  Key End Points: Change from baseline to week 12 in awake objective cough frequency measured with an automated cough monitor (Vitalojak) using 10mg, 20mg, 30mg orvepitant versus placebo  Key features of Volcano 2: Orvepitant is a centrally active, highly potent and selective NK(neurokinin)-1 receptor antagonist, recognized of its potential therapy for refractory cough Orvepitant was originally investigated for Major Depressive Disorder and PTSD Half Life of 7-15 hrs after single dose and increases to 34 hours after repeated dosing  Safety of Volcano 2 based on IB Version 6.0 and Date 09 Mar 2018__: Orvepitant has been administered in phase 2 studies to 557 patients to date- generally safe and well tolerated  A top dose of 30mg has been chosen based on efficacy/safety in Volcano 1 Monitored with assessments via cough monitor, lab work (no fasting needed), EKG, cough questionnaires, spirometry, physical exams, vital signs and adverse event reporting Fatigue, Dizziness, dry mouth, nausea and palpitations were most common AEs reported As of 20 Aug 2016 one death has occured- not considered related to study drug (pg110 in IB) ?  Study [id number]: Adverse Events Reported of Subjects in Any Treatment Group (Preferred Terms) Table 5.4-4,5 in IB   Placebo Orevipitant 10mg        30mg          60mg  > 5% side effect    Headache 9% 0%            9%             9%  Diarrhea 5%  0%            6%               6%  Somnolence 3%  0%            9%             9%  Nausea 8%  0%             5%            5%  Dry Mouth 8% 0%             7%             7%  Fatigue 2% 0%             4%            6%  Palpitations 1%  0%             3%             1%      Rare but important: Convulsions    Seen in (3) subjects taking 60mg /day- dose has been eliminated            ...............................................  S: Laura Underwood presents 01/31/17 for screening visit post consent. She under consent did wash out of her medications that she was supposed to wash out. She continues to express interest in research study participation on her voluntary will. PFT machine did not work 01/28/17 so this is continuation of that visit and needs exam. She did labs 01/28/17 but chemistry from that day was unsatisfactory sample. COn meds and medical hx done 01/28/17 and re-reiewed 01/31/17  Hx review  1. 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 STart date unknown. Mild, Referred to derm  2. Since consent (pre IP) 01/05/17 -= Worsening TSH - known goiter -  3. Hypertension - new med start 01/24/17 - norvasc 2.5mg  daily  O: exam done. In source doc   A   ICD-10-CM   1. Research subject Z00.6 EKG 12-Lead    Spirometry with graph  2. Chronic cough R05 EKG 12-Lead    Spirometry with graph   Screening visit - research  ADditional issues 1. Sebaceous  - asymptomatic, unrelated to study, ? AE v past medical. Noticed post consent 2. Worsening TSH with normal T4 and borderline high T3  In setting of  -Goiter - past hx, worse, unrelated to study 3. New   P Chemistry labs repeat  Spirometry  EKG   Dr. Brand Males, M.D., [facility].C.P Pulmonary and Critical Care Medicine Staff Physician [facility] Pager: [phone], If no answer or between  15:00h - 7:00h: call [phone]  02/01/2017 2:30 PM

## 2017-01-31 NOTE — Progress Notes (Signed)
Title: A Double- Blind, Randomized, Placebo Controlled Study of the Efficacy and Safety of Three Doses of Orvepitant in Subjects With Chronic Refractory Cough Sponsor: [employer]; Protocol Number: VOLCANO-2;  NCT Trial #: [id number]; Phase of Development: Phase 2  Synopsis: Randomized subjects 1:1:1:1 entering a three week screening period and randomized at baseline/day 1 and followed throughout a 12 week double blind dosing. Doses of orvepitant (10mg/day, 20mg/day and 30mg/day) or a placebo will be administered and evaluated in the reduction of awake objective cough frequency  Key Inclusion Criteria:  Awake cough frequency of greater than/equal to 10 coughs/hour Female/Female subjects greater than/equal to 18 yrs. Diagnosis of CRC/unexplained cough for at least 1 year Non pregnant/lactating females Two forms of birth control- for female and female subjects  Key Exclusion Criteria: Current smokers or ex smokers within 6 months Recent respiratory tract infection Malignancy in past 5 years except if in remission or skin cancers unless approved by sponsor Treatment with Angiotensin Converting Enzyme inhibitors within 3 months of screening History of drug/alcohol abuse within 12 months of screening Hx of cystic fibrosis,idiopathic pulmonary fibrosis, moderate-severe asthma, bronchiectasis, COPD, CHF Significant or unstable psychiatric condition Renal transplant, current dialysis, or Hx of renal tubular acidosis -No GFR/Cr cut off noted Uncontrolled hypertension at screening Significant neurological disorder- **prior PMH or increased risk of seizures (not febrile), or recent (within 6 months) of head trauma or loss of consciousness or concussion Recent myocardial infarction (within 1 year of screening) drug use taken solely for treatment of cough prohibited from at least 1 month prior to screening) Concomitant respiratory medication is allowed, but subjects must be stable on medication  and take it for the duration of the study IMPORTANT MEDICATIONS PATIENT CAN NOT BE ON: grapefruit juice, St. John's Wort, digoxin, diltiazem, fluconazole, verapamil, ciprofloaxcin, amiodarone, carvedilol, azithromycin ---> All from Screening until 1 week after LAST dose  Key End Points: Change from baseline to week 12 in awake objective cough frequency measured with an automated cough monitor (Vitalojak) using 10mg, 20mg, 30mg orvepitant versus placebo  Key features of Volcano 2: Orvepitant is a centrally active, highly potent and selective NK(neurokinin)-1 receptor antagonist, recognized of its potential therapy for refractory cough Orvepitant was originally investigated for Major Depressive Disorder and PTSD Half Life of 7-15 hrs after single dose and increases to 34 hours after repeated dosing  Safety of Volcano 2 based on IB Version 6.0 and Date 09 Mar 2018__: Orvepitant has been administered in phase 2 studies to 557 patients to date- generally safe and well tolerated  A top dose of 30mg has been chosen based on efficacy/safety in Volcano 1 Monitored with assessments via cough monitor, lab work (no fasting needed), EKG, cough questionnaires, spirometry, physical exams, vital signs and adverse event reporting Fatigue, Dizziness, dry mouth, nausea and palpitations were most common AEs reported As of 20 Aug 2016 one death has occured- not considered related to study drug (pg110 in IB) ?  Study [id number]: Adverse Events Reported of Subjects in Any Treatment Group (Preferred Terms) Table 5.4-4,5 in IB   Placebo Orevipitant 10mg        30mg          60mg  > 5% side effect    Headache 9% 0%            9%             9%  Diarrhea 5%  0%            6%               6%  Somnolence 3%  0%            9%             9%  Nausea 8%  0%             5%            5%  Dry Mouth 8% 0%             7%             7%  Fatigue 2% 0%             4%            6%  Palpitations 1%  0%             3%             1%      Rare but important: Convulsions    Seen in (3) subjects taking 60mg /day- dose has been eliminated     Clinical Research officer, political party / Research RN note : This visit for Subject Laura Underwood with DOB: 1950/07/14 on 01/31/2017 for the above protocol is Visit/Encounter # Screening  and is for purpose of research . The consent for this encounter is under Protocol Version 5.0 and is currently IRB approved.Subject expressed continued interest and consent in continuing as a study subject. Subject confirmed that there was no change in contact information (e.g. address, telephone, email). Subject thanked for participation in research and contribution to science.   In this visit 01/31/2017 the subject will be evaluated by sub-investigator named Dr. Chase Caller . This research coordinator has verified that the investigator is uptodate with his training logs. PI was not available for this visit due to vacation but is aware of the subjects screening visit and the sub-I will route his progress note to PI.   See coordinator progress note from August 17th, 2018 for documentation on first attempt at screening visit.  Today subject reported for the completion of screening visit due to technical issues with spirometry/ekg machine. Laboratory assessments were completed on 17/Aug/18 and documentation is filed from sponsor that a redraw is not needed, however lab report came back without chemistry resulted due to wrong sample matrix being sent , so chemistry only was re-drawn today. Lab report from 17/Aug/18 filed in subject paper source binder. All other required procedures per above stated protocol were performed. Cough monitor was placed on the subject per protocol, instructions provided, and return appointment set for 21/Aug/18. Subject states understanding of instructions.    Signed by [place] Bing, [place] Coordinator [facility]  [place], [place] 3:32 PM 01/31/2017

## 2017-01-31 NOTE — Progress Notes (Signed)
LATE ENTRY PROGRESS NOTE:  Scientist, physiological / Research RN note : This visit for Subject Laura Underwood with DOB: 1951/04/05 on 01/04/2017 for the above protocol is for Consent to participate and is for purpose of reserach . The consent for this encounter is under Protocol Version 5.0 and is currently IRB approved. Subject expressed continued interest and consent in continuing as a study subject. Subject confirmed that there was no change in contact information (e.g. address, telephone, email). Subject thanked for participation in research and contribution to science.   Subject presented on 24/Jul/18 to provide consent to participate on the above mentioned trial. Dr. Simonne Maffucci was present for the consent process and answered all of subjects questions. Subject signed consent on 24/Jul/18 at 0910. Thedore Mins, RN was also present for the consent process with this coordinator. Refer to Dr. Kathee Delton progress note for documentation on medication use and for further details of exam.    Signed by [place] Bing, [place] Coordinator  [facility]  [place], [place] 5:50 PM 01/31/2017

## 2017-02-01 ENCOUNTER — Other Ambulatory Visit: Payer: Self-pay | Admitting: Family

## 2017-02-01 DIAGNOSIS — E042 Nontoxic multinodular goiter: Secondary | ICD-10-CM

## 2017-02-01 NOTE — Progress Notes (Signed)
close

## 2017-02-01 NOTE — Patient Instructions (Signed)
ICD-10-CM   1. Research subject Z00.6 EKG 12-Lead    Spirometry with graph  2. Chronic cough R05 EKG 12-Lead    Spirometry with graph

## 2017-02-07 NOTE — Progress Notes (Signed)
reviewed

## 2017-02-18 ENCOUNTER — Encounter: Payer: Self-pay | Admitting: Pulmonary Disease

## 2017-02-18 ENCOUNTER — Ambulatory Visit: Payer: Medicare Other | Admitting: Pulmonary Disease

## 2017-02-18 VITALS — BP 120/80 | HR 66 | Temp 98.0°F | Wt 192.6 lb

## 2017-02-18 DIAGNOSIS — Z006 Encounter for examination for normal comparison and control in clinical research program: Secondary | ICD-10-CM

## 2017-02-18 DIAGNOSIS — R053 Chronic cough: Secondary | ICD-10-CM

## 2017-02-18 DIAGNOSIS — R05 Cough: Secondary | ICD-10-CM

## 2017-02-18 DIAGNOSIS — J432 Centrilobular emphysema: Secondary | ICD-10-CM

## 2017-02-18 NOTE — Progress Notes (Signed)
Title: A Double- Blind, Randomized, Placebo Controlled Study of the Efficacy and Safety of Three Doses of Orvepitant in Subjects With Chronic Refractory Cough Sponsor: [employer]; Protocol Number: VOLCANO-2;  NCT Trial #: [id number]; Phase of Development: Phase 2  Synopsis: Randomized subjects 1:1:1:1 entering a three week screening period and randomized at baseline/day 1 and followed throughout a 12 week double blind dosing. Doses of orvepitant (10mg/day, 20mg/day and 30mg/day) or a placebo will be administered and evaluated in the reduction of awake objective cough frequency  Key Inclusion Criteria:  Awake cough frequency of greater than/equal to 10 coughs/hour Female/Female subjects greater than/equal to 18 yrs. Diagnosis of CRC/unexplained cough for at least 1 year Non pregnant/lactating females Two forms of birth control- for female and female subjects  Key Exclusion Criteria: Current smokers or ex smokers within 6 months Recent respiratory tract infection Malignancy in past 5 years except if in remission or skin cancers unless approved by sponsor Treatment with Angiotensin Converting Enzyme inhibitors within 3 months of screening History of drug/alcohol abuse within 12 months of screening Hx of cystic fibrosis,idiopathic pulmonary fibrosis, moderate-severe asthma, bronchiectasis, COPD, CHF Significant or unstable psychiatric condition Renal transplant, current dialysis, or Hx of renal tubular acidosis -No GFR/Cr cut off noted Uncontrolled hypertension at screening Significant neurological disorder- **prior PMH or increased risk of seizures (not febrile), or recent (within 6 months) of head trauma or loss of consciousness or concussion Recent myocardial infarction (within 1 year of screening) drug use taken solely for treatment of cough prohibited from at least 1 month prior to screening) Concomitant respiratory medication is allowed, but subjects must be stable on medication  and take it for the duration of the study IMPORTANT MEDICATIONS PATIENT CAN NOT BE ON: grapefruit juice, St. John's Wort, digoxin, diltiazem, fluconazole, verapamil, ciprofloaxcin, amiodarone, carvedilol, azithromycin ---> All from Screening until 1 week after LAST dose  Key End Points: Change from baseline to week 12 in awake objective cough frequency measured with an automated cough monitor (Vitalojak) using 10mg, 20mg, 30mg orvepitant versus placebo  Key features of Volcano 2: Orvepitant is a centrally active, highly potent and selective NK(neurokinin)-1 receptor antagonist, recognized of its potential therapy for refractory cough Orvepitant was originally investigated for Major Depressive Disorder and PTSD Half Life of 7-15 hrs after single dose and increases to 34 hours after repeated dosing  Safety of Volcano 2 based on IB Version 6.0 and Date 09 Mar 2018__: Orvepitant has been administered in phase 2 studies to 557 patients to date- generally safe and well tolerated  A top dose of 30mg has been chosen based on efficacy/safety in Volcano 1 Monitored with assessments via cough monitor, lab work (no fasting needed), EKG, cough questionnaires, spirometry, physical exams, vital signs and adverse event reporting Fatigue, Dizziness, dry mouth, nausea and palpitations were most common AEs reported As of 20 Aug 2016 one death has occured- not considered related to study drug (pg110 in IB) ?  Study [id number]: Adverse Events Reported of Subjects in Any Treatment Group (Preferred Terms) Table 5.4-4,5 in IB   Placebo Orevipitant 10mg        30mg          60mg  > 5% side effect    Headache 9% 0%            9%             9%  Diarrhea 5%  0%            6%               6%  Somnolence 3%  0%            9%             9%  Nausea 8%  0%             5%            5%  Dry Mouth 8% 0%             7%             7%  Fatigue 2% 0%             4%            6%  Palpitations 1%  0%             3%             1%      Rare but important: Convulsions    Seen in (3) subjects taking 60mg/day- dose has been eliminated                                 

## 2017-02-21 NOTE — Progress Notes (Signed)
Patient seen and examined  She is feeling well  Physical exam: Gen: well appearing, no acute distress HENT: NCAT, OP clear, neck supple without masses Eyes: PERRL, EOMi Lymph: no cervical lymphadenopathy PULM: CTA B CV: RRR, no mgr, no JVD GI: BS+, soft, nontender, no hsm Derm: no rash or skin breakdown, small sebacious cyst approximately 1cm mid chest MSK: normal bulk and tone Neuro: A&Ox4, CN II-XII intact, strength 5/5 in all 4 extremities Psyche: normal mood and affect  Plan: Proceed per study protocol

## 2017-02-28 DIAGNOSIS — L708 Other acne: Secondary | ICD-10-CM | POA: Diagnosis not present

## 2017-02-28 DIAGNOSIS — D492 Neoplasm of unspecified behavior of bone, soft tissue, and skin: Secondary | ICD-10-CM | POA: Diagnosis not present

## 2017-02-28 DIAGNOSIS — L728 Other follicular cysts of the skin and subcutaneous tissue: Secondary | ICD-10-CM | POA: Diagnosis not present

## 2017-03-01 ENCOUNTER — Other Ambulatory Visit: Payer: Self-pay | Admitting: Pulmonary Disease

## 2017-03-01 DIAGNOSIS — R05 Cough: Secondary | ICD-10-CM

## 2017-03-01 DIAGNOSIS — Z23 Encounter for immunization: Secondary | ICD-10-CM | POA: Diagnosis not present

## 2017-03-01 DIAGNOSIS — R053 Chronic cough: Secondary | ICD-10-CM

## 2017-03-01 DIAGNOSIS — Z006 Encounter for examination for normal comparison and control in clinical research program: Secondary | ICD-10-CM

## 2017-03-01 NOTE — Progress Notes (Unsigned)
Title: A Double- Blind, Randomized, Placebo Controlled Study of the Efficacy and Safety of Three Doses of Orvepitant in Subjects With Chronic Refractory Cough Sponsor: [employer]; Protocol Number: VOLCANO-2;  NCT Trial #: [id number]; Phase of Development: Phase 2  Synopsis: Randomized subjects 1:1:1:1 entering a three week screening period and randomized at baseline/day 1 and followed throughout a 12 week double blind dosing. Doses of orvepitant (10mg/day, 20mg/day and 30mg/day) or a placebo will be administered and evaluated in the reduction of awake objective cough frequency  Key Inclusion Criteria:  Awake cough frequency of greater than/equal to 10 coughs/hour Female/Female subjects greater than/equal to 18 yrs. Diagnosis of CRC/unexplained cough for at least 1 year Non pregnant/lactating females Two forms of birth control- for female and female subjects  Key Exclusion Criteria: Current smokers or ex smokers within 6 months Recent respiratory tract infection Malignancy in past 5 years except if in remission or skin cancers unless approved by sponsor Treatment with Angiotensin Converting Enzyme inhibitors within 3 months of screening History of drug/alcohol abuse within 12 months of screening Hx of cystic fibrosis,idiopathic pulmonary fibrosis, moderate-severe asthma, bronchiectasis, COPD, CHF Significant or unstable psychiatric condition Renal transplant, current dialysis, or Hx of renal tubular acidosis -No GFR/Cr cut off noted Uncontrolled hypertension at screening Significant neurological disorder- **prior PMH or increased risk of seizures (not febrile), or recent (within 6 months) of head trauma or loss of consciousness or concussion Recent myocardial infarction (within 1 year of screening) drug use taken solely for treatment of cough prohibited from at least 1 month prior to screening) Concomitant respiratory medication is allowed, but subjects must be stable on medication  and take it for the duration of the study IMPORTANT MEDICATIONS PATIENT CAN NOT BE ON: grapefruit juice, St. John's Wort, digoxin, diltiazem, fluconazole, verapamil, ciprofloaxcin, amiodarone, carvedilol, azithromycin ---> All from Screening until 1 week after LAST dose  Key End Points: Change from baseline to week 12 in awake objective cough frequency measured with an automated cough monitor (Vitalojak) using 10mg, 20mg, 30mg orvepitant versus placebo  Key features of Volcano 2: Orvepitant is a centrally active, highly potent and selective NK(neurokinin)-1 receptor antagonist, recognized of its potential therapy for refractory cough Orvepitant was originally investigated for Major Depressive Disorder and PTSD Half Life of 7-15 hrs after single dose and increases to 34 hours after repeated dosing  Safety of Volcano 2 based on IB Version 6.0 and Date 09 Mar 2018__: Orvepitant has been administered in phase 2 studies to 557 patients to date- generally safe and well tolerated  A top dose of 30mg has been chosen based on efficacy/safety in Volcano 1 Monitored with assessments via cough monitor, lab work (no fasting needed), EKG, cough questionnaires, spirometry, physical exams, vital signs and adverse event reporting Fatigue, Dizziness, dry mouth, nausea and palpitations were most common AEs reported As of 20 Aug 2016 one death has occured- not considered related to study drug (pg110 in IB) ?  Study [id number]: Adverse Events Reported of Subjects in Any Treatment Group (Preferred Terms) Table 5.4-4,5 in IB   Placebo Orevipitant 10mg        30mg          60mg  > 5% side effect    Headache 9% 0%            9%             9%  Diarrhea 5%  0%            6%               6%  Somnolence 3%  0%            9%             9%  Nausea 8%  0%             5%            5%  Dry Mouth 8% 0%             7%             7%  Fatigue 2% 0%             4%            6%  Palpitations 1%  0%             3%             1%      Rare but important: Convulsions    Seen in (3) subjects taking 60mg /day- dose has been eliminated       Clinical Research officer, political party / Research RN note : This visit for Subject Charmayne Sheer with DOB: 03/19/1951 on 03/01/2017 for the above protocol is Visit/Encounter week 2  and is for purpose of research . The consent and protocol for this encounter is IRB approved. Subject expressed continued interest and consent in continuing as a study subject. Subject confirmed that there was no change in contact information (e.g. address, telephone, email). Subject thankful for participation in research and contribution to science.   Subjects vital sign remain within normal limits. Patient had biopsy on 17Sep18 of chest cyst but stated no other complaints at this time.  Patient remains compliant with regular home medications as well as study drug and is aware of next visit's expectations.  Patient remains under direct supervision of Dr Lake Bells and associated sub investigators if needed.  This CRC will confirmed that the note will be routed to PI and all study material be reviewed.    Signed by  Thedore Mins  Clinical Research Coordinator / Nurse [facility]  [place], [place] 11:04 AM 03/01/2017

## 2017-03-14 NOTE — Progress Notes (Signed)
LATE ENTRY:  .Title: A Double- Blind, Randomized, Placebo Controlled Study of the Efficacy and Safety of Three Doses of Orvepitant in Subjects With Chronic Refractory Cough Sponsor: [employer]; Protocol Number: VOLCANO-2;  NCT Trial #: [id number]; Phase of Development: Phase 2  Synopsis: Randomized subjects 1:1:1:1 entering a three week screening period and randomized at baseline/day 1 and followed throughout a 12 week double blind dosing. Doses of orvepitant (10mg /day, 20mg /day and 30mg /day) or a placebo will be administered and evaluated in the reduction of awake objective cough frequency  Key Inclusion Criteria:  Awake cough frequency of greater than/equal to 10 coughs/hour Female/Female subjects greater than/equal to 18 yrs. Diagnosis of CRC/unexplained cough for at least 1 year Non pregnant/lactating females Two forms of birth control- for female and female subjects  Key Exclusion Criteria: Current smokers or ex smokers within 6 months Recent respiratory tract infection Malignancy in past 5 years except if in remission or skin cancers unless approved by sponsor Treatment with Angiotensin Converting Enzyme inhibitors within 3 months of screening History of drug/alcohol abuse within 12 months of screening Hx of cystic fibrosis,idiopathic pulmonary fibrosis, moderate-severe asthma, bronchiectasis, COPD, CHF Significant or unstable psychiatric condition Renal transplant, current dialysis, or Hx of renal tubular acidosis -No GFR/Cr cut off noted Uncontrolled hypertension at screening Significant neurological disorder- **prior PMH or increased risk of seizures (not febrile), or recent (within 6 months) of head trauma or loss of consciousness or concussion Recent myocardial infarction (within 1 year of screening) drug use taken solely for treatment of cough prohibited from at least 1 month prior to screening) Concomitant respiratory medication is allowed, but subjects must be  stable on medication and take it for the duration of the study IMPORTANT MEDICATIONS PATIENT CAN NOT BE ON: grapefruit juice, St. John's Wort, digoxin, diltiazem, fluconazole, verapamil, ciprofloaxcin, amiodarone, carvedilol, azithromycin ---> All from Screening until 1 week after LAST dose  Key End Points: Change from baseline to week 12 in awake objective cough frequency measured with an automated cough monitor (Vitalojak) using 10mg , 20mg , 30mg  orvepitant versus placebo  Key features of Volcano 2: Orvepitant is a centrally active, highly potent and selective NK(neurokinin)-1 receptor antagonist, recognized of its potential therapy for refractory cough Orvepitant was originally investigated for Major Depressive Disorder and PTSD Half Life of 7-15 hrs after single dose and increases to 34 hours after repeated dosing  Safety of Volcano 2 based on IB Version 6.0 and Date 2022-09-05 Mar 2018__: Orvepitant has been administered in phase 2 studies to 557 patients to date- generally safe and well tolerated  A top dose of 30mg  has been chosen based on efficacy/safety in [place] [facility] Monitored with assessments via cough monitor, lab work (no fasting needed), EKG, cough questionnaires, spirometry, physical exams, vital signs and adverse event reporting Fatigue, Dizziness, dry mouth, nausea and palpitations were most common AEs reported As of 04-Sep-2016 one death has occured- not considered related to study drug (pg110 in IB) ?  Study [id number]: Adverse Events Reported of Subjects in Any Treatment Group (Preferred Terms) Table 5.4-4,5 in IB   Placebo Orevipitant 10mg         30mg           60mg   > 5% side effect    Headache 9% 0%            9%             9%  Diarrhea 5%  0%  6%             6%  Somnolence 3%  0%            9%             9%  Nausea 8%  0%             5%            5%  Dry Mouth 8% 0%             7%             7%  Fatigue 2% 0%             4%            6%  Palpitations 1%  0%              3%            1%      Rare but important: Convulsions    Seen in (3) subjects taking 60mg /day- dose has been eliminated    Clinical Research officer, political party / Research RN note : This visit for Subject Laura Underwood with DOB: December 29, 1950 on Sept 07, 2018 for the above protocol is Visit/Encounter # Baseline/Week 1  and is for purpose of research . The consent for this encounter is under Protocol Version 3.1 and  is currently IRB approved. Subject expressed continued interest and consent in continuing as a study subject. Subject confirmed that there was  no change in contact information (e.g. address, telephone, email). Subject thanked for participation in research and contribution to science.   In this visit on Sept 07, 2018 the subject will be evaluated by investigator named Dr. Lake Bells . This research coordinator has verified that the investigator is uptodate with his training logs. Because this visit is a key visit of randomization this visit is under direct supervision of the PI Dr.Mcquaid . This PI is available for this visit.  All procedures completed per the above named protocol and reviewed by Dr. Lake Bells. Subject was approved or randomization, an first dose of IP was administered at study site by this coordinator and Thedore Mins, Research Nurse. The subject was observed for 30 minutes after dosing. Subject denied any complaints at the end of the observation period. Next appointment reminders provided.    Signed by [place] Bing, [place] Coordinator  [facility]  [place], [place] 6:09 PM 03/14/2017 (Late entry, visit occurred on Sept 7, 2018)

## 2017-03-21 DIAGNOSIS — R05 Cough: Secondary | ICD-10-CM

## 2017-03-21 DIAGNOSIS — R053 Chronic cough: Secondary | ICD-10-CM

## 2017-03-21 DIAGNOSIS — Z006 Encounter for examination for normal comparison and control in clinical research program: Secondary | ICD-10-CM

## 2017-03-21 NOTE — Progress Notes (Signed)
Title: A Double- Blind, Randomized, Placebo Controlled Study of the Efficacy and Safety of Three Doses of Orvepitant in Subjects With Chronic Refractory Cough Sponsor: [employer]; Protocol Number: VOLCANO-2;  NCT Trial #: [id number]; Phase of Development: Phase 2  Synopsis: Randomized subjects 1:1:1:1 entering a three week screening period and randomized at baseline/day 1 and followed throughout a 12 week double blind dosing. Doses of orvepitant (10mg/day, 20mg/day and 30mg/day) or a placebo will be administered and evaluated in the reduction of awake objective cough frequency  Key Inclusion Criteria:  Awake cough frequency of greater than/equal to 10 coughs/hour Female/Female subjects greater than/equal to 18 yrs. Diagnosis of CRC/unexplained cough for at least 1 year Non pregnant/lactating females Two forms of birth control- for female and female subjects  Key Exclusion Criteria: Current smokers or ex smokers within 6 months Recent respiratory tract infection Malignancy in past 5 years except if in remission or skin cancers unless approved by sponsor Treatment with Angiotensin Converting Enzyme inhibitors within 3 months of screening History of drug/alcohol abuse within 12 months of screening Hx of cystic fibrosis,idiopathic pulmonary fibrosis, moderate-severe asthma, bronchiectasis, COPD, CHF Significant or unstable psychiatric condition Renal transplant, current dialysis, or Hx of renal tubular acidosis -No GFR/Cr cut off noted Uncontrolled hypertension at screening Significant neurological disorder- **prior PMH or increased risk of seizures (not febrile), or recent (within 6 months) of head trauma or loss of consciousness or concussion Recent myocardial infarction (within 1 year of screening) drug use taken solely for treatment of cough prohibited from at least 1 month prior to screening) Concomitant respiratory medication is allowed, but subjects must be stable on medication  and take it for the duration of the study IMPORTANT MEDICATIONS PATIENT CAN NOT BE ON: grapefruit juice, St. John's Wort, digoxin, diltiazem, fluconazole, verapamil, ciprofloaxcin, amiodarone, carvedilol, azithromycin ---> All from Screening until 1 week after LAST dose  Key End Points: Change from baseline to week 12 in awake objective cough frequency measured with an automated cough monitor (Vitalojak) using 10mg, 20mg, 30mg orvepitant versus placebo  Key features of Volcano 2: Orvepitant is a centrally active, highly potent and selective NK(neurokinin)-1 receptor antagonist, recognized of its potential therapy for refractory cough Orvepitant was originally investigated for Major Depressive Disorder and PTSD Half Life of 7-15 hrs after single dose and increases to 34 hours after repeated dosing  Safety of Volcano 2 based on IB Version 6.0 and Date 09 Mar 2018__: Orvepitant has been administered in phase 2 studies to 557 patients to date- generally safe and well tolerated  A top dose of 30mg has been chosen based on efficacy/safety in [place] 1 Monitored with assessments via cough monitor, lab work (no fasting needed), EKG, cough questionnaires, spirometry, physical exams, vital signs and adverse event reporting Fatigue, Dizziness, dry mouth, nausea and palpitations were most common AEs reported As of 20 Aug 2016 one death has occured- not considered related to study drug (pg110 in IB) ?  Study [id number]: Adverse Events Reported of Subjects in Any Treatment Group (Preferred Terms) Table 5.4-4,5 in IB   Placebo Orevipitant 10mg        30mg          60mg  > 5% side effect    Headache 9% 0%            9%             9%  Diarrhea 5%  0%            6%               6%  Somnolence 3%  0%            9%             9%  Nausea 8%  0%             5%            5%  Dry Mouth 8% 0%             7%             7%  Fatigue 2% 0%             4%            6%  Palpitations 1%  0%             3%             1%      Rare but important: Convulsions    Seen in (3) subjects taking 60mg /day- dose has been eliminated    Clinical Research officer, political party / Research RN note : This visit for Subject Laura Underwood with DOB: 11-01-1950 on 03/21/2017 for the above protocol is Visit/Encounter # Week 4 and is for purpose of research . The consent for this encounter is under Protocol Version 5.0 and is currently IRB approved.Subject expressed continued interest and consent in continuing as a study subject. Subject confirmed that there was no change in contact information (e.g. address, telephone, email). Subject thanked for participation in research and contribution to science.   All procedures completed per the above mentioned protocol. ACM attached to subject and subject is aware to return on 09/Oct/18 to return the ACM. Subject denies any questions. Subject stated she received the Flu Vaccine since her last visit. This has been documented on the subjects conmed list. Refer to the subjects paper source binder for further documentation.   Signed by [place] Bing, [place], [place] Coordinator [place], [place] 4:08 PM 03/21/2017

## 2017-04-18 ENCOUNTER — Encounter: Payer: Self-pay | Admitting: Family

## 2017-04-18 ENCOUNTER — Ambulatory Visit (INDEPENDENT_AMBULATORY_CARE_PROVIDER_SITE_OTHER): Payer: Medicare Other | Admitting: Family

## 2017-04-18 VITALS — BP 136/80 | HR 79 | Temp 98.2°F | Ht 65.0 in | Wt 190.0 lb

## 2017-04-18 DIAGNOSIS — Z0000 Encounter for general adult medical examination without abnormal findings: Secondary | ICD-10-CM | POA: Diagnosis not present

## 2017-04-18 DIAGNOSIS — I10 Essential (primary) hypertension: Secondary | ICD-10-CM | POA: Diagnosis not present

## 2017-04-18 DIAGNOSIS — Z23 Encounter for immunization: Secondary | ICD-10-CM

## 2017-04-18 DIAGNOSIS — E119 Type 2 diabetes mellitus without complications: Secondary | ICD-10-CM

## 2017-04-18 LAB — CBC WITH DIFFERENTIAL/PLATELET
Basophils Absolute: 0 10*3/uL (ref 0.0–0.1)
Basophils Relative: 0.4 % (ref 0.0–3.0)
EOS ABS: 0.1 10*3/uL (ref 0.0–0.7)
Eosinophils Relative: 2.4 % (ref 0.0–5.0)
HEMATOCRIT: 41.2 % (ref 36.0–46.0)
HEMOGLOBIN: 13.3 g/dL (ref 12.0–15.0)
Lymphocytes Relative: 34 % (ref 12.0–46.0)
Lymphs Abs: 2.1 10*3/uL (ref 0.7–4.0)
MCHC: 32.2 g/dL (ref 30.0–36.0)
MCV: 83.7 fl (ref 78.0–100.0)
MONO ABS: 0.5 10*3/uL (ref 0.1–1.0)
Monocytes Relative: 8.3 % (ref 3.0–12.0)
Neutro Abs: 3.3 10*3/uL (ref 1.4–7.7)
Neutrophils Relative %: 54.9 % (ref 43.0–77.0)
Platelets: 287 10*3/uL (ref 150.0–400.0)
RBC: 4.92 Mil/uL (ref 3.87–5.11)
RDW: 13.8 % (ref 11.5–15.5)
WBC: 6.1 10*3/uL (ref 4.0–10.5)

## 2017-04-18 LAB — COMPREHENSIVE METABOLIC PANEL
ALBUMIN: 4.5 g/dL (ref 3.5–5.2)
ALT: 19 U/L (ref 0–35)
AST: 21 U/L (ref 0–37)
Alkaline Phosphatase: 42 U/L (ref 39–117)
BILIRUBIN TOTAL: 0.4 mg/dL (ref 0.2–1.2)
BUN: 10 mg/dL (ref 6–23)
CALCIUM: 9.4 mg/dL (ref 8.4–10.5)
CO2: 30 meq/L (ref 19–32)
CREATININE: 0.64 mg/dL (ref 0.40–1.20)
Chloride: 103 mEq/L (ref 96–112)
GFR: 119.65 mL/min (ref >60.00)
Glucose, Bld: 100 mg/dL — ABNORMAL HIGH (ref 70–99)
Potassium: 4.2 mEq/L (ref 3.5–5.1)
SODIUM: 139 meq/L (ref 135–145)
TOTAL PROTEIN: 7 g/dL (ref 6.0–8.3)

## 2017-04-18 LAB — MICROALBUMIN / CREATININE URINE RATIO
Creatinine,U: 86.2 mg/dL
MICROALB/CREAT RATIO: 0.8 mg/g (ref 0.0–30.0)
Microalb, Ur: <0.7 mg/dL (ref 0.0–1.9)

## 2017-04-18 LAB — LIPID PANEL
CHOLESTEROL: 153 mg/dL (ref 0–200)
HDL: 40.5 mg/dL (ref >39.00)
LDL CALC: 100 mg/dL — AB (ref 0–99)
NONHDL: 112.67
Total CHOL/HDL Ratio: 4
Triglycerides: 64 mg/dL (ref 0.0–149.0)
VLDL: 12.8 mg/dL (ref 0.0–40.0)

## 2017-04-18 LAB — HEMOGLOBIN A1C: Hgb A1c MFr Bld: 6.5 % (ref 4.6–6.5)

## 2017-04-18 MED ORDER — PNEUMOCOCCAL 13-VAL CONJ VACC IM SUSP
0.5000 mL | Freq: Once | INTRAMUSCULAR | Status: AC
  Administered 2017-04-18: 0.5 mL via INTRAMUSCULAR

## 2017-04-18 NOTE — Progress Notes (Signed)
Subjective:    Patient ID: Laura Underwood, female    DOB: [birth year], 66 y.o.   MRN: [id number]  CC: Laura Underwood is a 66 y.o. female who presents today for physical exam.    HPI: Feeling well No complaints  DM -compliant with medication HTN-compliant medication Denies exertional chest pain or pressure, numbness or tingling radiating to left arm or jaw, palpitations, dizziness, frequent headaches, changes in vision, or shortness of breath.    Hypothyroidism- seeing endocrine later this month     Colorectal Cancer Screening: UTD , repeat in 5 year Breast Cancer Screening: Mammogram due ; follows with [facility], Dr Isaiah Blakes; doesn't need order per patient. . Cervical Cancer Screening: UTD, 2017, normal;  No vaginal blleding Bone Health screening/DEXA for 65+: No increased fracture risk. Defer screening at this time; would like to wait until next year.  Lung Cancer Screening: Doesn't have 30 year pack year history and age > 77 years.       Tetanus - utd        Pneumococcal - Candidate for., consents  HIV Screening- Candidate for ,declines Labs: Screening labs today. Exercise: Gets regular exercise.  Alcohol use: rarely Smoking/tobacco use: Nonsmoker.   Wears seat belt: Yes. Skin: no h/o skin cancer or worrisome lesions  HISTORY:  Past Medical History:  Diagnosis Date  . Abdominal pain, other specified site   . Arthritis   . Asthma    Dr [place] B[place]  . Chronic cough    s/p evaluation by allergist and pulmonologist   . Colon polyp   . Cough   . Diabetes mellitus without complication ([place])   . Diverticulosis   . Dizziness   . GERD (gastroesophageal reflux disease)   . Goiter   . Headache(784.0)   . Hemorrhoids   . Hiatal hernia   . Irritable bowel disease   . Menopause   . Shortness of breath   . Stress   . Wears glasses     Past Surgical History:  Procedure Laterality Date  . CESAREAN SECTION  [phone]  . CESAREAN SECTION  [phone]  . CHOLECYSTECTOMY  09/13/14   . jaw abscess  jan 2012  . LAPAROSCOPY  1981  . ovarin cyst  2004   ovary removed   Family History  Problem Relation Age of Onset  . Stroke Mother   . Hypertension Mother   . Hyperlipidemia Mother   . Depression Mother   . Hypertension Sister   . Prostate cancer Father   . Liver cancer Father        metastatic liver  . Breast cancer Maternal Aunt   . Depression Maternal Aunt   . Depression Maternal Grandfather   . Depression Cousin   . Pancreatic cancer Paternal Aunt   . Pancreatic cancer Paternal Grandmother       ALLERGIES: Latex; Nickel; Benzalkonium chloride; and Neosporin [neomycin-bacitracin zn-polymyx]  Current Outpatient Medications on File Prior to Visit  Medication Sig Dispense Refill  . albuterol (PROVENTIL HFA;VENTOLIN HFA) 108 (90 BASE) MCG/ACT inhaler Inhale 2 puffs into the lungs every 4 (four) hours as needed for wheezing or shortness of breath. 1 Inhaler 6  . amLODipine (NORVASC) 2.5 MG tablet Take 1 tablet (2.5 mg total) by mouth daily. 90 tablet 3  . aspirin 81 MG tablet Take 81 mg by mouth daily.    . cetirizine (ZYRTEC) 10 MG tablet Take 1 mg by mouth daily.    . Cholecalciferol (VITAMIN D PO) Take by mouth daily.    Marland Kitchen  Cinnamon 500 MG capsule Take 500 mg by mouth daily.    Marland Kitchen docusate sodium (COLACE) 100 MG capsule Take 100 mg by mouth 2 (two) times daily.    . folic acid (FOLVITE) 144 MCG tablet Take 400 mcg by mouth daily.    . hydroxypropyl methylcellulose / hypromellose (ISOPTO TEARS / GONIOVISC) 2.5 % ophthalmic solution Place 1 drop into both eyes as needed for dry eyes.    . metFORMIN (GLUCOPHAGE XR) 500 MG 24 hr tablet Start 500mg  PO qpm. 90 tablet 3  . Misc Natural Products (TART CHERRY ADVANCED PO) Take 1,200 mg by mouth daily.    . Multiple Vitamin (MULTIVITAMIN) tablet Take 1 tablet by mouth daily.    . Omega-3 Fatty Acids (FISH OIL) 1200 MG CAPS Take by mouth.    . TURMERIC PO Take by mouth.     No current facility-administered medications  on file prior to visit.     Social History   Tobacco Use  . Smoking status: Never Smoker  . Smokeless tobacco: Never Used  Substance Use Topics  . Alcohol use: Yes    Alcohol/week: 0.0 oz    Types: 1 - 2 Cans of beer per week    Comment: rarely  . Drug use: No    Review of Systems  Constitutional: Negative for chills, fever and unexpected weight change.  HENT: Negative for congestion.   Respiratory: Negative for cough.   Cardiovascular: Negative for chest pain, palpitations and leg swelling.  Gastrointestinal: Negative for nausea and vomiting.  Genitourinary: Negative for vaginal bleeding.  Musculoskeletal: Negative for arthralgias and myalgias.  Skin: Negative for rash.  Neurological: Negative for headaches.  Hematological: Negative for adenopathy.  Psychiatric/Behavioral: Negative for confusion.      Objective:    BP 136/80   Pulse 79   Temp 98.2 F (36.8 C) (Oral)   Ht 5\' 5"  (1.651 m)   Wt 190 lb (86.2 kg)   SpO2 98%   BMI 31.62 kg/m   BP Readings from Last 3 Encounters:  04/18/17 136/80  02/18/17 120/80  01/31/17 120/80   Wt Readings from Last 3 Encounters:  04/18/17 190 lb (86.2 kg)  02/18/17 192 lb 9.6 oz (87.4 kg)  01/31/17 193 lb 9.6 oz (87.8 kg)    Physical Exam  Constitutional: She appears well-developed and well-nourished.  Eyes: Conjunctivae are normal.  Neck: No thyroid mass and no thyromegaly present.  Cardiovascular: Normal rate, regular rhythm, normal heart sounds and normal pulses.  Pulmonary/Chest: Effort normal and breath sounds normal. She has no wheezes. She has no rhonchi. She has no rales. Right breast exhibits no inverted nipple, no mass, no nipple discharge, no skin change and no tenderness. Left breast exhibits no inverted nipple, no mass, no nipple discharge, no skin change and no tenderness. Breasts are symmetrical.  CBE performed.   Lymphadenopathy:       Head (right side): No submental, no submandibular, no tonsillar, no  preauricular, no posterior auricular and no occipital adenopathy present.       Head (left side): No submental, no submandibular, no tonsillar, no preauricular, no posterior auricular and no occipital adenopathy present.    She has no cervical adenopathy.       Right cervical: No superficial cervical, no deep cervical and no posterior cervical adenopathy present.      Left cervical: No superficial cervical, no deep cervical and no posterior cervical adenopathy present.    She has no axillary adenopathy.  Neurological: She is alert.  Skin: Skin is warm and dry.  Psychiatric: She has a normal mood and affect. Her speech is normal and behavior is normal. Thought content normal.  Vitals reviewed.      Assessment & Plan:   Problem List Items Addressed This Visit      Cardiovascular and Mediastinum   HTN (hypertension)    At goal. Continue current regimen.        Endocrine   Diabetes mellitus type 2, controlled ([place])    Compliant with metformin. Pending A1c      Relevant Orders   Hemoglobin A1c     Other   Routine general medical examination at a health care facility - Primary    Clinical breast exam performed. Patient will schedule mammogram. In the absence of symptoms, patient politely declines pelvic exam today. Declines repeat DEXA at this time. Given Prevnar today. Screening labs ordered.      Relevant Orders   CBC with Differential/Platelet   Comprehensive metabolic panel   Lipid panel   Microalbumin / creatinine urine ratio    Other Visit Diagnoses    Need for Streptococcus pneumoniae vaccination       Relevant Medications   pneumococcal 13-valent conjugate vaccine (PREVNAR 13) injection 0.5 mL (Completed)       I have discontinued Mayme B. Faeth's VENTOLIN HFA. I am also having her maintain her Fish Oil, multivitamin, folic acid, Misc Natural Products (TART CHERRY ADVANCED PO), aspirin, docusate sodium, Cholecalciferol (VITAMIN D PO), hydroxypropyl  methylcellulose / hypromellose, albuterol, TURMERIC PO, Cinnamon, cetirizine, amLODipine, and metFORMIN. We administered pneumococcal 13-valent conjugate vaccine.   Meds ordered this encounter  Medications  . pneumococcal 13-valent conjugate vaccine (PREVNAR 13) injection 0.5 mL    Return precautions given.   Risks, benefits, and alternatives of the medications and treatment plan prescribed today were discussed, and patient expressed understanding.   Education regarding symptom management and diagnosis given to patient on AVS.   Continue to follow with Burnard Hawthorne, FNP for routine health maintenance.   Laura Underwood and I agreed with plan.   Mable Paris, FNP

## 2017-04-18 NOTE — Assessment & Plan Note (Signed)
Clinical breast exam performed. Patient will schedule mammogram. In the absence of symptoms, patient politely declines pelvic exam today. Declines repeat DEXA at this time. Given Prevnar today. Screening labs ordered.

## 2017-04-18 NOTE — Patient Instructions (Addendum)
Labs today  Pleasure seeing you!  We placed a referral for mammogram this year. I asked that you call one the below locations and schedule this when it is convenient for you.   As discussed, I would like you to ask for 3D mammogram over the traditional 2D mammogram as new evidence suggest 3D is superior.   Please note that NOT all insurance companies cover 3D and you may have to pay a higher copay. You may call your insurance company to further clarify your benefits.   Options for [place]  [place], [place]  * [facility] [place] [place] * Note if you ask for 3D mammogram at this location, you must request [place], [place] location*  Health Maintenance, Female Adopting a healthy lifestyle and getting preventive care can go a long way to promote health and wellness. Talk with your health care provider about what schedule of regular examinations is right for you. This is a good chance for you to check in with your provider about disease prevention and staying healthy. In between checkups, there are plenty of things you can do on your own. Experts have done a lot of research about which lifestyle changes and preventive measures are most likely to keep you healthy. Ask your health care provider for more information. Weight and diet Eat a healthy diet  Be sure to include plenty of vegetables, fruits, low-fat dairy products, and lean protein.  Do not eat a lot of foods high in solid fats, added sugars, or salt.  Get regular exercise. This is one of the most important things you can do for your health. ? Most adults should exercise for at least 150 minutes each week. The exercise should increase your heart rate and make you sweat (moderate-intensity exercise). ? Most adults should also do strengthening exercises at least twice a week.  This is in addition to the moderate-intensity exercise.  Maintain a healthy weight  Body mass index (BMI) is a measurement that can be used to identify possible weight problems. It estimates body fat based on height and weight. Your health care provider can help determine your BMI and help you achieve or maintain a healthy weight.  For females 72 years of age and older: ? A BMI below 18.5 is considered underweight. ? A BMI of 18.5 to 24.9 is normal. ? A BMI of 25 to 29.9 is considered overweight. ? A BMI of 30 and above is considered obese.  Watch levels of cholesterol and blood lipids  You should start having your blood tested for lipids and cholesterol at 65 years of age, then have this test every 5 years.  You may need to have your cholesterol levels checked more often if: ? Your lipid or cholesterol levels are high. ? You are older than 65 years of age. ? You are at high risk for heart disease.  Cancer screening Lung Cancer  Lung cancer screening is recommended for adults 62-57 years old who are at high risk for lung cancer because of a history of smoking.  A yearly low-dose CT scan of the lungs is recommended for people who: ? Currently smoke. ? Have quit within the past 15 years. ? Have at least a 30-pack-year history of smoking. A pack year is smoking an average of one pack of cigarettes a day for 1 year.  Yearly  screening should continue until it has been 15 years since you quit.  Yearly screening should stop if you develop a health problem that would prevent you from having lung cancer treatment.  Breast Cancer  Practice breast self-awareness. This means understanding how your breasts normally appear and feel.  It also means doing regular breast self-exams. Let your health care provider know about any changes, no matter how small.  If you are in your 20s or 30s, you should have a clinical breast exam (CBE) by a health care provider every 1-3 years as part of a  regular health exam.  If you are 60 or older, have a CBE every year. Also consider having a breast X-ray (mammogram) every year.  If you have a family history of breast cancer, talk to your health care provider about genetic screening.  If you are at high risk for breast cancer, talk to your health care provider about having an MRI and a mammogram every year.  Breast cancer gene (BRCA) assessment is recommended for women who have family members with BRCA-related cancers. BRCA-related cancers include: ? Breast. ? Ovarian. ? Tubal. ? Peritoneal cancers.  Results of the assessment will determine the need for genetic counseling and BRCA1 and BRCA2 testing.  Cervical Cancer Your health care provider may recommend that you be screened regularly for cancer of the pelvic organs (ovaries, uterus, and vagina). This screening involves a pelvic examination, including checking for microscopic changes to the surface of your cervix (Pap test). You may be encouraged to have this screening done every 3 years, beginning at age 3.  For women ages 14-65, health care providers may recommend pelvic exams and Pap testing every 3 years, or they may recommend the Pap and pelvic exam, combined with testing for human papilloma virus (HPV), every 5 years. Some types of HPV increase your risk of cervical cancer. Testing for HPV may also be done on women of any age with unclear Pap test results.  Other health care providers may not recommend any screening for nonpregnant women who are considered low risk for pelvic cancer and who do not have symptoms. Ask your health care provider if a screening pelvic exam is right for you.  If you have had past treatment for cervical cancer or a condition that could lead to cancer, you need Pap tests and screening for cancer for at least 20 years after your treatment. If Pap tests have been discontinued, your risk factors (such as having a new sexual partner) need to be reassessed to  determine if screening should resume. Some women have medical problems that increase the chance of getting cervical cancer. In these cases, your health care provider may recommend more frequent screening and Pap tests.  Colorectal Cancer  This type of cancer can be detected and often prevented.  Routine colorectal cancer screening usually begins at 65 years of age and continues through 65 years of age.  Your health care provider may recommend screening at an earlier age if you have risk factors for colon cancer.  Your health care provider may also recommend using home test kits to check for hidden blood in the stool.  A small camera at the end of a tube can be used to examine your colon directly (sigmoidoscopy or colonoscopy). This is done to check for the earliest forms of colorectal cancer.  Routine screening usually begins at age 8.  Direct examination of the colon should be repeated every 5-10 years through 65 years of age. However,  you may need to be screened more often if early forms of precancerous polyps or small growths are found.  Skin Cancer  Check your skin from head to toe regularly.  Tell your health care provider about any new moles or changes in moles, especially if there is a change in a mole's shape or color.  Also tell your health care provider if you have a mole that is larger than the size of a pencil eraser.  Always use sunscreen. Apply sunscreen liberally and repeatedly throughout the day.  Protect yourself by wearing long sleeves, pants, a wide-brimmed hat, and sunglasses whenever you are outside.  Heart disease, diabetes, and high blood pressure  High blood pressure causes heart disease and increases the risk of stroke. High blood pressure is more likely to develop in: ? People who have blood pressure in the high end of the normal range (130-139/85-89 mm Hg). ? People who are overweight or obese. ? People who are African American.  If you are 18-39 years  of age, have your blood pressure checked every 3-5 years. If you are 28 years of age or older, have your blood pressure checked every year. You should have your blood pressure measured twice-once when you are at a hospital or clinic, and once when you are not at a hospital or clinic. Record the average of the two measurements. To check your blood pressure when you are not at a hospital or clinic, you can use: ? An automated blood pressure machine at a pharmacy. ? A home blood pressure monitor.  If you are between 50 years and 44 years old, ask your health care provider if you should take aspirin to prevent strokes.  Have regular diabetes screenings. This involves taking a blood sample to check your fasting blood sugar level. ? If you are at a normal weight and have a low risk for diabetes, have this test once every three years after 65 years of age. ? If you are overweight and have a high risk for diabetes, consider being tested at a younger age or more often. Preventing infection Hepatitis B  If you have a higher risk for hepatitis B, you should be screened for this virus. You are considered at high risk for hepatitis B if: ? You were born in a country where hepatitis B is common. Ask your health care provider which countries are considered high risk. ? Your parents were born in a high-risk country, and you have not been immunized against hepatitis B (hepatitis B vaccine). ? You have HIV or AIDS. ? You use needles to inject street drugs. ? You live with someone who has hepatitis B. ? You have had sex with someone who has hepatitis B. ? You get hemodialysis treatment. ? You take certain medicines for conditions, including cancer, organ transplantation, and autoimmune conditions.  Hepatitis C  Blood testing is recommended for: ? Everyone born from [birth year] through 1964. ? Anyone with known risk factors for hepatitis C.  Sexually transmitted infections (STIs)  You should be screened for  sexually transmitted infections (STIs) including gonorrhea and chlamydia if: ? You are sexually active and are younger than 65 years of age. ? You are older than 65 years of age and your health care provider tells you that you are at risk for this type of infection. ? Your sexual activity has changed since you were last screened and you are at an increased risk for chlamydia or gonorrhea. Ask your health care provider if you  are at risk.  If you do not have HIV, but are at risk, it may be recommended that you take a prescription medicine daily to prevent HIV infection. This is called pre-exposure prophylaxis (PrEP). You are considered at risk if: ? You are sexually active and do not regularly use condoms or know the HIV status of your partner(s). ? You take drugs by injection. ? You are sexually active with a partner who has HIV.  Talk with your health care provider about whether you are at high risk of being infected with HIV. If you choose to begin PrEP, you should first be tested for HIV. You should then be tested every 3 months for as long as you are taking PrEP. Pregnancy  If you are premenopausal and you may become pregnant, ask your health care provider about preconception counseling.  If you may become pregnant, take 400 to 800 micrograms (mcg) of folic acid every day.  If you want to prevent pregnancy, talk to your health care provider about birth control (contraception). Osteoporosis and menopause  Osteoporosis is a disease in which the bones lose minerals and strength with aging. This can result in serious bone fractures. Your risk for osteoporosis can be identified using a bone density scan.  If you are 42 years of age or older, or if you are at risk for osteoporosis and fractures, ask your health care provider if you should be screened.  Ask your health care provider whether you should take a calcium or vitamin D supplement to lower your risk for osteoporosis.  Menopause may  have certain physical symptoms and risks.  Hormone replacement therapy may reduce some of these symptoms and risks. Talk to your health care provider about whether hormone replacement therapy is right for you. Follow these instructions at home:  Schedule regular health, dental, and eye exams.  Stay current with your immunizations.  Do not use any tobacco products including cigarettes, chewing tobacco, or electronic cigarettes.  If you are pregnant, do not drink alcohol.  If you are breastfeeding, limit how much and how often you drink alcohol.  Limit alcohol intake to no more than 1 drink per day for nonpregnant women. One drink equals 12 ounces of beer, 5 ounces of wine, or 1 ounces of hard liquor.  Do not use street drugs.  Do not share needles.  Ask your health care provider for help if you need support or information about quitting drugs.  Tell your health care provider if you often feel depressed.  Tell your health care provider if you have ever been abused or do not feel safe at home. This information is not intended to replace advice given to you by your health care provider. Make sure you discuss any questions you have with your health care provider. Document Released: 12/14/2010 Document Revised: 11/06/2015 Document Reviewed: 03/04/2015 Elsevier Interactive Patient Education  [employer].

## 2017-04-18 NOTE — Assessment & Plan Note (Signed)
At goal. Continue current regimen. 

## 2017-04-18 NOTE — Assessment & Plan Note (Signed)
Compliant with metformin. Pending A1c

## 2017-04-18 NOTE — Progress Notes (Signed)
Pre visit review using our clinic review tool, if applicable. No additional management support is needed unless otherwise documented below in the visit note. 

## 2017-04-19 ENCOUNTER — Encounter: Payer: Self-pay | Admitting: Family

## 2017-04-19 DIAGNOSIS — Z006 Encounter for examination for normal comparison and control in clinical research program: Secondary | ICD-10-CM

## 2017-04-19 NOTE — Progress Notes (Signed)
Title: A Double- Blind, Randomized, Placebo Controlled Study of the Efficacy and Safety of Three Doses of Orvepitant in Subjects With Chronic Refractory Cough Sponsor: [employer]; Protocol Number: VOLCANO-2;  NCT Trial #: [id number]; Phase of Development: Phase 2  Synopsis: Randomized subjects 1:1:1:1 entering a three week screening period and randomized at baseline/day 1 and followed throughout a 12 week double blind dosing. Doses of orvepitant (10mg/day, 20mg/day and 30mg/day) or a placebo will be administered and evaluated in the reduction of awake objective cough frequency  Key Inclusion Criteria:  Awake cough frequency of greater than/equal to 10 coughs/hour Female/Female subjects greater than/equal to 18 yrs. Diagnosis of CRC/unexplained cough for at least 1 year Non pregnant/lactating females Two forms of birth control- for female and female subjects  Key Exclusion Criteria: Current smokers or ex smokers within 6 months Recent respiratory tract infection Malignancy in past 5 years except if in remission or skin cancers unless approved by sponsor Treatment with Angiotensin Converting Enzyme inhibitors within 3 months of screening History of drug/alcohol abuse within 12 months of screening Hx of cystic fibrosis,idiopathic pulmonary fibrosis, moderate-severe asthma, bronchiectasis, COPD, CHF Significant or unstable psychiatric condition Renal transplant, current dialysis, or Hx of renal tubular acidosis -No GFR/Cr cut off noted Uncontrolled hypertension at screening Significant neurological disorder- **prior PMH or increased risk of seizures (not febrile), or recent (within 6 months) of head trauma or loss of consciousness or concussion Recent myocardial infarction (within 1 year of screening) drug use taken solely for treatment of cough prohibited from at least 1 month prior to screening) Concomitant respiratory medication is allowed, but subjects must be stable on medication  and take it for the duration of the study IMPORTANT MEDICATIONS PATIENT CAN NOT BE ON: grapefruit juice, St. John's Wort, digoxin, diltiazem, fluconazole, verapamil, ciprofloaxcin, amiodarone, carvedilol, azithromycin ---> All from Screening until 1 week after LAST dose  Key End Points: Change from baseline to week 12 in awake objective cough frequency measured with an automated cough monitor (Vitalojak) using 10mg, 20mg, 30mg orvepitant versus placebo  Key features of Volcano 2: Orvepitant is a centrally active, highly potent and selective NK(neurokinin)-1 receptor antagonist, recognized of its potential therapy for refractory cough Orvepitant was originally investigated for Major Depressive Disorder and PTSD Half Life of 7-15 hrs after single dose and increases to 34 hours after repeated dosing  Safety of Volcano 2 based on IB Version 6.0 and Date 09 Mar 2018__: Orvepitant has been administered in phase 2 studies to 557 patients to date- generally safe and well tolerated  A top dose of 30mg has been chosen based on efficacy/safety in Volcano 1 Monitored with assessments via cough monitor, lab work (no fasting needed), EKG, cough questionnaires, spirometry, physical exams, vital signs and adverse event reporting Fatigue, Dizziness, dry mouth, nausea and palpitations were most common AEs reported As of 20 Aug 2016 one death has occured- not considered related to study drug (pg110 in IB) ?  Study [id number]: Adverse Events Reported of Subjects in Any Treatment Group (Preferred Terms) Table 5.4-4,5 in IB   Placebo Orevipitant 10mg        30mg          60mg  > 5% side effect    Headache 9% 0%            9%             9%  Diarrhea 5%  0%            6%               6%  Somnolence 3%  0%            9%             9%  Nausea 8%  0%             5%            5%  Dry Mouth 8% 0%             7%             7%  Fatigue 2% 0%             4%            6%  Palpitations 1%  0%             3%             1%      Rare but important: Convulsions    Seen in (3) subjects taking 60mg /day- dose has been eliminated     Clinical Research officer, political party / Research RN note : This visit for Subject Laura Underwood with DOB: [birth year] on 04/19/2017 for the above protocol is Visit/Encounter # week 8  and is for purpose of research. The consent and Protocol Version for this encounter is IRB approved. Subject has expressed continued interest and consent in continuing as a study subject. Subject confirmed that there were change in contact information (e.g. address, telephone, email). Subject thanked for participation in research and contribution to science.   In this visit 04/19/2017 the subject will not need to be evaluated by PI. This research coordinator has verified that the investigator is uptodate with his training logs.  Subject expressed no new issues at this time.  The CRC has confirmed that the note will be routed to PI.  Signed by  August Luz Clinical Research Coordinator / Nurse Horatio Pel, [place] 10:37 AM 04/19/2017

## 2017-04-21 ENCOUNTER — Other Ambulatory Visit: Payer: Self-pay | Admitting: Family

## 2017-04-21 DIAGNOSIS — E785 Hyperlipidemia, unspecified: Secondary | ICD-10-CM | POA: Insufficient documentation

## 2017-04-21 MED ORDER — ROSUVASTATIN CALCIUM 10 MG PO TABS: 10.0000 mg | ORAL_TABLET | Freq: Every day | ORAL | 3 refills | Status: DC

## 2017-04-21 NOTE — Progress Notes (Signed)
close

## 2017-05-09 DIAGNOSIS — E0590 Thyrotoxicosis, unspecified without thyrotoxic crisis or storm: Secondary | ICD-10-CM | POA: Diagnosis not present

## 2017-05-09 DIAGNOSIS — E0520 Thyrotoxicosis with toxic multinodular goiter without thyrotoxic crisis or storm: Secondary | ICD-10-CM | POA: Diagnosis not present

## 2017-05-18 DIAGNOSIS — Z006 Encounter for examination for normal comparison and control in clinical research program: Secondary | ICD-10-CM

## 2017-05-18 DIAGNOSIS — R05 Cough: Secondary | ICD-10-CM

## 2017-05-18 DIAGNOSIS — R053 Chronic cough: Secondary | ICD-10-CM

## 2017-05-19 NOTE — Progress Notes (Signed)
Late Entry: 05/19/2017 Title: A Double- Blind, Randomized, Placebo Controlled Study of the Efficacy and Safety of Three Doses of Orvepitant in Subjects With Chronic Refractory Cough Sponsor: [employer]; Protocol Number: VOLCANO-2;  NCT Trial #: [id number]; Phase of Development: Phase 2  Synopsis: Randomized subjects 1:1:1:1 entering a three week screening period and randomized at baseline/day 1 and followed throughout a 12 week double blind dosing. Doses of orvepitant (10mg /day, 20mg /day and 30mg /day) or a placebo will be administered and evaluated in the reduction of awake objective cough frequency  Clinical Research Coordinator / Research RN note : This visit for Subject Laura Underwood with DOB: December 21, 1950 on 05/18/2017 for the above protocol is Visit/Encounter # Week 12  and is for purpose of research . Subject expressed continued interest and consent in continuing as a study subject. Subject confirmed that there was no change in contact information (e.g. address, telephone, email). Subject thanked for participation in research and contribution to science.  This visit is for EOT. Subject dosed in clinic with last IP dose per protocol. All procedures completed per protocol and subject was fitted with ACM. Subject will return on 05/19/2017 to return the ACM.   New conmed was added to the subjects list, crestor 10 mg daily due to LDL of 100 and diabetes per PCP.    Signed by  [place] Clinical Research Coordinator [facility]  [place], [place] 6:41 PM 05/19/2017

## 2017-05-30 ENCOUNTER — Encounter: Payer: Self-pay | Admitting: Podiatry

## 2017-05-30 ENCOUNTER — Ambulatory Visit (INDEPENDENT_AMBULATORY_CARE_PROVIDER_SITE_OTHER): Payer: Medicare Other | Admitting: Podiatry

## 2017-05-30 DIAGNOSIS — E119 Type 2 diabetes mellitus without complications: Secondary | ICD-10-CM

## 2017-05-30 DIAGNOSIS — M6740 Ganglion, unspecified site: Secondary | ICD-10-CM

## 2017-05-30 NOTE — Progress Notes (Signed)
She presents today for her diabetic checkup.  She states that there is really been no changes in her past medical history medications allergies surgery social history or review of systems.  Objective: Vital signs are stable alert and oriented x3 I looked at her most recent labs demonstrating a 6.4 A1c.  Pulses remain palpable capillary fill time is somewhat sluggish neurologic sensorium is intact per Semmes Weinstein monofilament and vibratory sensation.  Deep tendon reflexes are intact and symmetrical bilateral.  Orthopedic evaluation demonstrates all joints distal to the ankle full range of motion without complications.  Assessment: Diabetes mellitus without complications at this point.  Plan: Follow-up with me in 1 year.  She will call with questions or concerns regarding her diabetic foot.

## 2017-05-31 DIAGNOSIS — Z1231 Encounter for screening mammogram for malignant neoplasm of breast: Secondary | ICD-10-CM | POA: Diagnosis not present

## 2017-05-31 LAB — HM MAMMOGRAPHY

## 2017-06-03 ENCOUNTER — Ambulatory Visit (INDEPENDENT_AMBULATORY_CARE_PROVIDER_SITE_OTHER): Payer: Medicare Other | Admitting: Adult Health

## 2017-06-03 ENCOUNTER — Encounter: Payer: Self-pay | Admitting: Adult Health

## 2017-06-03 VITALS — BP 120/76 | HR 74 | Temp 98.4°F | Resp 12

## 2017-06-03 DIAGNOSIS — R05 Cough: Secondary | ICD-10-CM

## 2017-06-03 DIAGNOSIS — J4520 Mild intermittent asthma, uncomplicated: Secondary | ICD-10-CM

## 2017-06-03 DIAGNOSIS — Z006 Encounter for examination for normal comparison and control in clinical research program: Secondary | ICD-10-CM

## 2017-06-03 DIAGNOSIS — R053 Chronic cough: Secondary | ICD-10-CM

## 2017-06-03 NOTE — Progress Notes (Signed)
Title: A Double- Blind, Randomized, Placebo Controlled Study of the Efficacy and Safety of Three Doses of Orvepitant in Subjects With Chronic Refractory Cough Sponsor: [employer]; Protocol Number: VOLCANO-2;  NCT Trial #: [id number]; Phase of Development: Phase 2  Synopsis: Randomized subjects 1:1:1:1 entering a three week screening period and randomized at baseline/day 1 and followed throughout a 12 week double blind dosing. Doses of orvepitant (10mg/day, 20mg/day and 30mg/day) or a placebo will be administered and evaluated in the reduction of awake objective cough frequency  Key Inclusion Criteria:  Awake cough frequency of greater than/equal to 10 coughs/hour Female/Female subjects greater than/equal to 18 yrs. Diagnosis of CRC/unexplained cough for at least 1 year Non pregnant/lactating females Two forms of birth control- for female and female subjects  Key Exclusion Criteria: Current smokers or ex smokers within 6 months Recent respiratory tract infection Malignancy in past 5 years except if in remission or skin cancers unless approved by sponsor Treatment with Angiotensin Converting Enzyme inhibitors within 3 months of screening History of drug/alcohol abuse within 12 months of screening Hx of cystic fibrosis,idiopathic pulmonary fibrosis, moderate-severe asthma, bronchiectasis, COPD, CHF Significant or unstable psychiatric condition Renal transplant, current dialysis, or Hx of renal tubular acidosis -No GFR/Cr cut off noted Uncontrolled hypertension at screening Significant neurological disorder- **prior PMH or increased risk of seizures (not febrile), or recent (within 6 months) of head trauma or loss of consciousness or concussion Recent myocardial infarction (within 1 year of screening) drug use taken solely for treatment of cough prohibited from at least 1 month prior to screening) Concomitant respiratory medication is allowed, but subjects must be stable on medication  and take it for the duration of the study IMPORTANT MEDICATIONS PATIENT CAN NOT BE ON: grapefruit juice, St. John's Wort, digoxin, diltiazem, fluconazole, verapamil, ciprofloaxcin, amiodarone, carvedilol, azithromycin ---> All from Screening until 1 week after LAST dose  Key End Points: Change from baseline to week 12 in awake objective cough frequency measured with an automated cough monitor (Vitalojak) using 10mg, 20mg, 30mg orvepitant versus placebo  Key features of Volcano 2: Orvepitant is a centrally active, highly potent and selective NK(neurokinin)-1 receptor antagonist, recognized of its potential therapy for refractory cough Orvepitant was originally investigated for Major Depressive Disorder and PTSD Half Life of 7-15 hrs after single dose and increases to 34 hours after repeated dosing  Safety of Volcano 2 based on IB Version 6.0 and Date 09 Mar 2018__: Orvepitant has been administered in phase 2 studies to 557 patients to date- generally safe and well tolerated  A top dose of 30mg has been chosen based on efficacy/safety in Volcano 1 Monitored with assessments via cough monitor, lab work (no fasting needed), EKG, cough questionnaires, spirometry, physical exams, vital signs and adverse event reporting Fatigue, Dizziness, dry mouth, nausea and palpitations were most common AEs reported As of 20 Aug 2016 one death has occured- not considered related to study drug (pg110 in IB) ?  Study [id number]: Adverse Events Reported of Subjects in Any Treatment Group (Preferred Terms) Table 5.4-4,5 in IB   Placebo Orevipitant 10mg        30mg          60mg  > 5% side effect    Headache 9% 0%            9%             9%  Diarrhea 5%  0%            6%               6%  Somnolence 3%  0%            9%             9%  Nausea 8%  0%             5%            5%  Dry Mouth 8% 0%             7%             7%  Fatigue 2% 0%             4%            6%  Palpitations 1%  0%             3%             1%      Rare but important: Convulsions    Seen in (3) subjects taking 60mg/day- dose has been eliminated                                 

## 2017-06-03 NOTE — Assessment & Plan Note (Signed)
VOLCANO 2 -EOS Visit 14   Plan  Continue per study protocol .

## 2017-06-03 NOTE — Progress Notes (Signed)
@Patient  ID: Laura Underwood, female    DOB: 07-08-1950, 66 y.o.   MRN: [id number]  Chief Complaint  Patient presents with  . Research    EOS Visit Week 14     Referring provider: Burnard Hawthorne, FNP  HPI: 66 yo female followed for chronic cough   06/03/2017 Research Visit : EOS Week 27 Pt returns for research Visit . Volcano 2 . Says she is doing well . Feels very well . Doing yoga.  Denies any complaints today .  EKG reviewed .   She has known hyperthyroidism and hyperlipidemia that is chronic and ongoing .    Allergies  Allergen Reactions  . Latex   . Nickel   . Benzalkonium Chloride Rash  . Neosporin [Neomycin-Bacitracin Zn-Polymyx] Rash    Immunization History  Administered Date(s) Administered  . Influenza Split 02/25/2014  . Influenza Whole 04/02/2012, 03/15/2013  . Influenza,inj,Quad PF,6+ Mos 03/13/2013  . Influenza-Unspecified 03/03/2015, 03/15/2016  . Pneumococcal Conjugate-13 04/18/2017  . Pneumococcal Polysaccharide-23 01/31/2013  . Tdap 01/31/2013  . Zoster 03/15/2013    Past Medical History:  Diagnosis Date  . Abdominal pain, other specified site   . Arthritis   . Asthma    Dr [place]  . Chronic cough    s/p evaluation by allergist and pulmonologist   . Colon polyp   . Cough   . Diabetes mellitus without complication ([place])   . Diverticulosis   . Dizziness   . GERD (gastroesophageal reflux disease)   . Goiter   . Headache(784.0)   . Hemorrhoids   . Hiatal hernia   . Irritable bowel disease   . Menopause   . Shortness of breath   . Stress   . Wears glasses     Tobacco History: Social History   Tobacco Use  Smoking Status Never Smoker  Smokeless Tobacco Never Used   Counseling given: Not Answered   Outpatient Encounter Medications as of 06/03/2017  Medication Sig  . albuterol (PROVENTIL HFA;VENTOLIN HFA) 108 (90 BASE) MCG/ACT inhaler Inhale 2 puffs into the lungs every 4 (four) hours as needed for wheezing or shortness  of breath.  Marland Kitchen amLODipine (NORVASC) 2.5 MG tablet Take 1 tablet (2.5 mg total) by mouth daily.  Marland Kitchen aspirin 81 MG tablet Take 81 mg by mouth daily.  . cetirizine (ZYRTEC) 10 MG tablet Take 1 mg by mouth daily.  . Cholecalciferol (VITAMIN D PO) Take by mouth daily.  . Cinnamon 500 MG capsule Take 500 mg by mouth daily.  Marland Kitchen docusate sodium (COLACE) 100 MG capsule Take 100 mg by mouth 2 (two) times daily.  . folic acid (FOLVITE) 448 MCG tablet Take 400 mcg by mouth daily.  . hydroxypropyl methylcellulose / hypromellose (ISOPTO TEARS / GONIOVISC) 2.5 % ophthalmic solution Place 1 drop into both eyes as needed for dry eyes.  . metFORMIN (GLUCOPHAGE XR) 500 MG 24 hr tablet Start 500mg  PO qpm.  . Misc Natural Products (TART CHERRY ADVANCED PO) Take 1,200 mg by mouth daily.  . Multiple Vitamin (MULTIVITAMIN) tablet Take 1 tablet by mouth daily.  . Omega-3 Fatty Acids (FISH OIL) 1200 MG CAPS Take by mouth.  . rosuvastatin (CRESTOR) 10 MG tablet Take 1 tablet (10 mg total) daily by mouth.  . TURMERIC PO Take by mouth.   No facility-administered encounter medications on file as of 06/03/2017.      ROS  See HPI    Physical Exam  BP 120/76 (BP Location: Right Arm, Patient Position: Sitting, Cuff  Size: Normal)   Pulse 74   Temp 98.4 F (36.9 C) (Oral)   Resp 12   GEN: A/Ox3; pleasant , NAD, well nourished    HEENT:  [place]/AT,  EACs-clear, TMs-wnl, NOSE-clear, THROAT-clear, no lesions, no postnasal drip or exudate noted.   NECK:  Supple w/ fair ROM; no JVD; normal carotid impulses w/o bruits; no thyromegaly or nodules palpated; no lymphadenopathy.    RESP  Clear  P & A; w/o, wheezes/ rales/ or rhonchi. no accessory muscle use, no dullness to percussion  CARD:  RRR, no m/r/g, no peripheral edema, pulses intact, no cyanosis or clubbing.  GI:   Soft & nt; nml bowel sounds; no organomegaly or masses detected.   Musco: Warm bil, no deformities or joint swelling noted.   Neuro: alert, no focal  deficits noted.    Skin: Warm, no lesions or rashes    Lab Results:   BNP No results found for: BNP  ProBNP No results found for: PROBNP  Imaging: No results found.   Assessment & Plan:   Research study patient VOLCANO 2 -EOS Visit 14   Plan  Continue per study protocol .   Asthma Stable  follow up Dr. Pennie Banter in 3 months and As needed        Rexene Edison, NP 06/03/2017

## 2017-06-03 NOTE — Assessment & Plan Note (Signed)
Stable  follow up Dr. Pennie Banter in 3 months and As needed

## 2017-06-03 NOTE — Progress Notes (Signed)
Reviewed, met with patient, answered questions.  Agree.

## 2017-08-15 ENCOUNTER — Ambulatory Visit (INDEPENDENT_AMBULATORY_CARE_PROVIDER_SITE_OTHER): Payer: Medicare Other | Admitting: Family

## 2017-08-15 ENCOUNTER — Encounter: Payer: Self-pay | Admitting: Family

## 2017-08-15 ENCOUNTER — Ambulatory Visit: Payer: Medicare Other | Admitting: Family

## 2017-08-15 VITALS — BP 120/86 | HR 80 | Temp 98.4°F | Wt 170.4 lb

## 2017-08-15 DIAGNOSIS — I10 Essential (primary) hypertension: Secondary | ICD-10-CM

## 2017-08-15 DIAGNOSIS — E785 Hyperlipidemia, unspecified: Secondary | ICD-10-CM

## 2017-08-15 DIAGNOSIS — E119 Type 2 diabetes mellitus without complications: Secondary | ICD-10-CM

## 2017-08-15 NOTE — Progress Notes (Signed)
Subjective:    Patient ID: Laura Underwood, female    DOB: 08/29/1950, 67 y.o.   MRN: [id number]  CC: Laura Underwood is a 67 y.o. female who presents today for follow up.   HPI: Feeling well .   No complaints  Has lost 20 pounds.  Thinks its the breo and amitriptyline - has stopped both and also eating healthier.   Chronic cough- completed research study. Cough is actually a little better.   HTN-  Compliant with medication. Denies exertional chest pain or pressure, numbness or tingling radiating to left arm or jaw, palpitations, dizziness, frequent headaches, changes in vision, or shortness of breath.   DM- taking metformin.     reviewed Laura Underwood note 04/2017; due to repeat thyroid US to determine whether or not to biopsy. Will not treat if remain in subclinicial range.  HISTORY:  Past Medical History:  Diagnosis Date  . Abdominal pain, other specified site   . Arthritis   . Asthma    Laura Underwood  . Chronic cough    s/p evaluation by allergist and pulmonologist   . Colon polyp   . Cough   . Diabetes mellitus without complication ([place])   . Diverticulosis   . Dizziness   . GERD (gastroesophageal reflux disease)   . Goiter   . Headache(784.0)   . Hemorrhoids   . Hiatal hernia   . Irritable bowel disease   . Menopause   . Shortness of breath   . Stress   . Wears glasses    Past Surgical History:  Procedure Laterality Date  . BRAVO [place] STUDY N/A 08/15/2013   Procedure: BRAVO [place] STUDY;  Surgeon: Laura Ng, MD;  Location: [facility];  Service: Endoscopy;  Laterality: N/A;  . CESAREAN SECTION  [phone]  . CESAREAN SECTION  [phone]  . CHOLECYSTECTOMY  09/13/14  . COLONOSCOPY N/A 08/15/2013   Procedure: COLONOSCOPY;  Surgeon: Laura Ng, MD;  Location: [facility];  Service: Endoscopy;  Laterality: N/A;  . ESOPHAGOGASTRODUODENOSCOPY N/A 08/15/2013   Procedure: ESOPHAGOGASTRODUODENOSCOPY (EGD);  Surgeon: Laura Ng, MD;  Location: [facility];   Service: Endoscopy;  Laterality: N/A;  . jaw abscess  jan 2012  . LAPAROSCOPY  1981  . ovarin cyst  2004   ovary removed   Family History  Problem Relation Age of Onset  . Stroke Mother   . Hypertension Mother   . Hyperlipidemia Mother   . Depression Mother   . Hypertension Sister   . Prostate cancer Father   . Liver cancer Father        metastatic liver  . Breast cancer Maternal Aunt   . Depression Maternal Aunt   . Depression Maternal Grandfather   . Depression Cousin   . Pancreatic cancer Paternal Aunt   . Pancreatic cancer Paternal Grandmother     Allergies: Latex; Nickel; Benzalkonium chloride; and Neosporin [neomycin-bacitracin zn-polymyx] Current Outpatient Medications on File Prior to Visit  Medication Sig Dispense Refill  . albuterol (PROVENTIL HFA;VENTOLIN HFA) 108 (90 BASE) MCG/ACT inhaler Inhale 2 puffs into the lungs every 4 (four) hours as needed for wheezing or shortness of breath. 1 Inhaler 6  . amLODipine (NORVASC) 2.5 MG tablet Take 1 tablet (2.5 mg total) by mouth daily. 90 tablet 3  . aspirin 81 MG tablet Take 81 mg by mouth daily.    . cetirizine (ZYRTEC) 10 MG tablet Take 1 mg by mouth daily.    . Cholecalciferol (VITAMIN D PO) Take by  mouth daily.    . Cinnamon 500 MG capsule Take 500 mg by mouth daily.    Marland Kitchen docusate sodium (COLACE) 100 MG capsule Take 100 mg by mouth 2 (two) times daily.    . folic acid (FOLVITE) 726 MCG tablet Take 400 mcg by mouth daily.    . hydroxypropyl methylcellulose / hypromellose (ISOPTO TEARS / GONIOVISC) 2.5 % ophthalmic solution Place 1 drop into both eyes as needed for dry eyes.    . metFORMIN (GLUCOPHAGE XR) 500 MG 24 hr tablet Start 500mg  PO qpm. 90 tablet 3  . Misc Natural Products (TART CHERRY ADVANCED PO) Take 1,200 mg by mouth daily.    . Multiple Vitamin (MULTIVITAMIN) tablet Take 1 tablet by mouth daily.    . Omega-3 Fatty Acids (FISH OIL) 1200 MG CAPS Take by mouth.    . rosuvastatin (CRESTOR) 10 MG tablet Take 1  tablet (10 mg total) daily by mouth. 90 tablet 3  . TURMERIC PO Take by mouth.     No current facility-administered medications on file prior to visit.     Social History   Tobacco Use  . Smoking status: Never Smoker  . Smokeless tobacco: Never Used  Substance Use Topics  . Alcohol use: Yes    Alcohol/week: 0.0 oz    Types: 1 - 2 Cans of beer per week    Comment: rarely  . Drug use: No    Review of Systems  Constitutional: Negative for chills and fever.  Respiratory: Negative for cough.   Cardiovascular: Negative for chest pain and palpitations.  Gastrointestinal: Negative for nausea and vomiting.      Objective:    BP 120/86 (BP Location: Left Arm, Patient Position: Sitting, Cuff Size: Large)   Pulse 80   Temp 98.4 F (36.9 C) (Oral)   Wt 170 lb 6 oz (77.3 kg)   SpO2 98%   BMI 28.35 kg/m  BP Readings from Last 3 Encounters:  08/15/17 120/86  06/03/17 120/76  04/18/17 136/80   Wt Readings from Last 3 Encounters:  08/15/17 170 lb 6 oz (77.3 kg)  04/18/17 190 lb (86.2 kg)  02/18/17 192 lb 9.6 oz (87.4 kg)    Physical Exam  Constitutional: She appears well-developed and well-nourished.  Eyes: Conjunctivae are normal.  Cardiovascular: Normal rate, regular rhythm, normal heart sounds and normal pulses.  Pulmonary/Chest: Effort normal and breath sounds normal. She has no wheezes. She has no rhonchi. She has no rales.  Neurological: She is alert.  Skin: Skin is warm and dry.  Psychiatric: She has a normal mood and affect. Her speech is normal and behavior is normal. Thought content normal.  Vitals reviewed.      Assessment & Plan:   Problem List Items Addressed This Visit      Cardiovascular and Mediastinum   HTN (hypertension)    Doing well. Continue regimen.         Endocrine   Diabetes mellitus type 2, controlled ([place]) - Primary    Compliant with medication. Pending a1c.       Relevant Orders   Hemoglobin A1c     Other   HLD (hyperlipidemia)     Relevant Orders   Lipid panel       I am having [name] maintain her Fish Oil, multivitamin, folic acid, Misc Natural Products (TART CHERRY ADVANCED PO), aspirin, docusate sodium, Cholecalciferol (VITAMIN D PO), hydroxypropyl methylcellulose / hypromellose, albuterol, TURMERIC PO, Cinnamon, cetirizine, amLODipine, metFORMIN, and rosuvastatin.   No orders  of the defined types were placed in this encounter.   Return precautions given.   Risks, benefits, and alternatives of the medications and treatment plan prescribed today were discussed, and patient expressed understanding.   Education regarding symptom management and diagnosis given to patient on AVS.  Continue to follow with Burnard Hawthorne, FNP for routine health maintenance.   Laura Underwood and I agreed with plan.   Mable Paris, FNP

## 2017-08-15 NOTE — Assessment & Plan Note (Signed)
Compliant with medication. Pending a1c.

## 2017-08-15 NOTE — Patient Instructions (Addendum)
Fasting labs when you can  Such a pleasure seeing you!

## 2017-08-15 NOTE — Assessment & Plan Note (Signed)
Doing well. Continue regimen.

## 2017-08-23 DIAGNOSIS — E119 Type 2 diabetes mellitus without complications: Secondary | ICD-10-CM | POA: Diagnosis not present

## 2017-08-23 LAB — HM DIABETES EYE EXAM

## 2017-08-24 ENCOUNTER — Encounter: Payer: Self-pay | Admitting: Family

## 2017-08-25 ENCOUNTER — Other Ambulatory Visit (INDEPENDENT_AMBULATORY_CARE_PROVIDER_SITE_OTHER): Payer: Medicare Other

## 2017-08-25 DIAGNOSIS — E785 Hyperlipidemia, unspecified: Secondary | ICD-10-CM

## 2017-08-25 DIAGNOSIS — E119 Type 2 diabetes mellitus without complications: Secondary | ICD-10-CM

## 2017-08-25 LAB — LIPID PANEL
CHOLESTEROL: 97 mg/dL (ref 0–200)
HDL: 37 mg/dL — ABNORMAL LOW (ref >39.00)
LDL CALC: 49 mg/dL (ref 0–99)
NonHDL: 60.13
TRIGLYCERIDES: 56 mg/dL (ref 0.0–149.0)
Total CHOL/HDL Ratio: 3
VLDL: 11.2 mg/dL (ref 0.0–40.0)

## 2017-08-25 LAB — HEMOGLOBIN A1C: HEMOGLOBIN A1C: 6.4 % (ref 4.6–6.5)

## 2017-11-03 DIAGNOSIS — E0520 Thyrotoxicosis with toxic multinodular goiter without thyrotoxic crisis or storm: Secondary | ICD-10-CM | POA: Diagnosis not present

## 2017-11-08 DIAGNOSIS — E0520 Thyrotoxicosis with toxic multinodular goiter without thyrotoxic crisis or storm: Secondary | ICD-10-CM | POA: Diagnosis not present

## 2017-11-08 DIAGNOSIS — E0590 Thyrotoxicosis, unspecified without thyrotoxic crisis or storm: Secondary | ICD-10-CM | POA: Diagnosis not present

## 2018-01-13 ENCOUNTER — Other Ambulatory Visit: Payer: Self-pay | Admitting: Family

## 2018-01-13 DIAGNOSIS — I10 Essential (primary) hypertension: Secondary | ICD-10-CM

## 2018-03-06 DIAGNOSIS — G500 Trigeminal neuralgia: Secondary | ICD-10-CM | POA: Diagnosis not present

## 2018-03-06 DIAGNOSIS — E538 Deficiency of other specified B group vitamins: Secondary | ICD-10-CM | POA: Diagnosis not present

## 2018-03-06 DIAGNOSIS — E559 Vitamin D deficiency, unspecified: Secondary | ICD-10-CM | POA: Diagnosis not present

## 2018-03-07 ENCOUNTER — Other Ambulatory Visit: Payer: Self-pay | Admitting: Neurology

## 2018-03-07 DIAGNOSIS — G501 Atypical facial pain: Secondary | ICD-10-CM

## 2018-03-07 DIAGNOSIS — G500 Trigeminal neuralgia: Secondary | ICD-10-CM

## 2018-03-07 DIAGNOSIS — G508 Other disorders of trigeminal nerve: Secondary | ICD-10-CM

## 2018-03-07 DIAGNOSIS — R220 Localized swelling, mass and lump, head: Secondary | ICD-10-CM

## 2018-03-07 DIAGNOSIS — R29810 Facial weakness: Secondary | ICD-10-CM

## 2018-03-09 DIAGNOSIS — Z23 Encounter for immunization: Secondary | ICD-10-CM | POA: Diagnosis not present

## 2018-03-10 DIAGNOSIS — E559 Vitamin D deficiency, unspecified: Secondary | ICD-10-CM | POA: Diagnosis not present

## 2018-03-10 DIAGNOSIS — E538 Deficiency of other specified B group vitamins: Secondary | ICD-10-CM | POA: Diagnosis not present

## 2018-03-10 DIAGNOSIS — Z79899 Other long term (current) drug therapy: Secondary | ICD-10-CM | POA: Diagnosis not present

## 2018-03-17 ENCOUNTER — Ambulatory Visit
Admission: RE | Admit: 2018-03-17 | Discharge: 2018-03-17 | Disposition: A | Discharge status: Home / Self Care | Payer: Medicare Other | Source: Ambulatory Visit | Attending: Neurology | Admitting: Neurology

## 2018-03-17 DIAGNOSIS — R220 Localized swelling, mass and lump, head: Secondary | ICD-10-CM | POA: Insufficient documentation

## 2018-03-17 DIAGNOSIS — R51 Headache: Secondary | ICD-10-CM | POA: Insufficient documentation

## 2018-03-17 IMAGING — MR MR FACE/TRIGEMINAL WO/W CM
5 of 8 series · 22 of 48 positions shown · IV contrast (gadavist)
Comparison: None.

CLINICAL DATA: Episodes of left-sided facial pain. Remote episode
of right-sided facial pain.

EXAM:
MRI FACE TRIGEMINAL WITHOUT AND WITH CONTRAST
TECHNIQUE: Multiplanar, multiecho pulse sequences of the face and surrounding
structures, including thin slice imaging of the course of the
Trigeminal Nerves, were obtained both before and after
administration of intravenous contrast.
CONTRAST:  7.5 mL Gadavist

[Series 5: T1 · sagittal · 3.0mm · 0.35mm/px · 4 of 29 slices shown (1 of 3)]
[im 1/29]
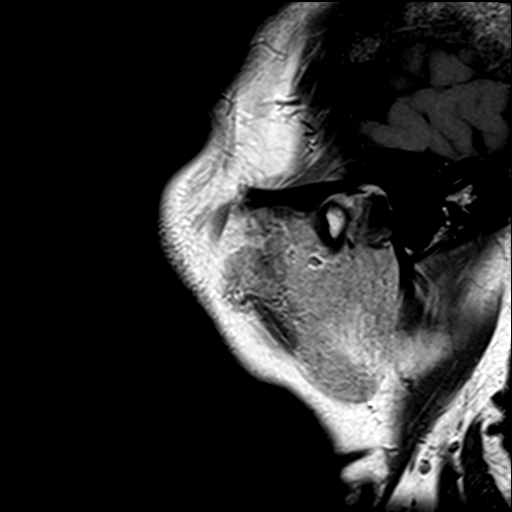
[im 10/29]
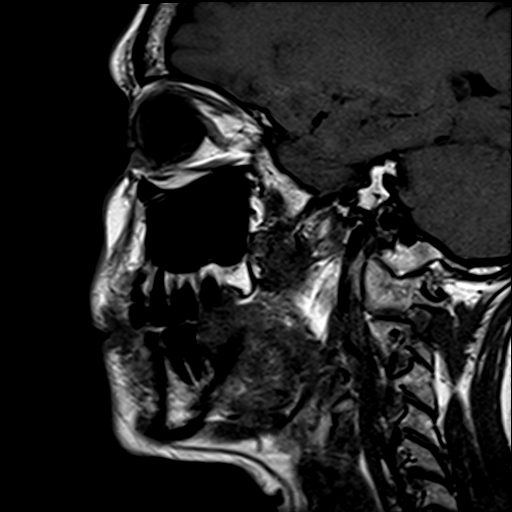
[im 19/29]
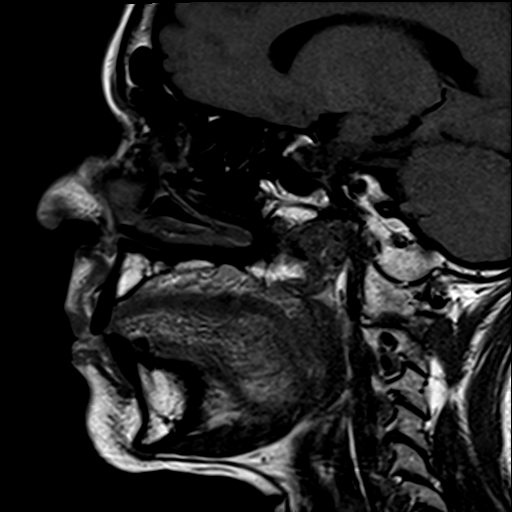
[im 29/29]
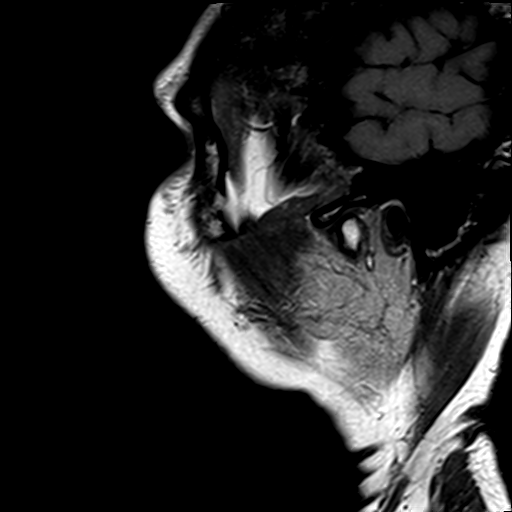

[Series 6: T2 · coronal · 3.0mm · 0.70mm/px · 4 of 35 slices shown]
[im 1/35]
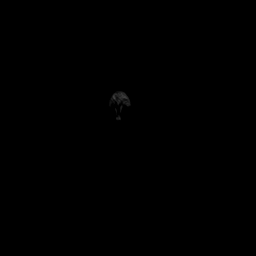
[im 12/35]
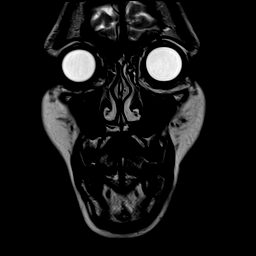
[im 23/35]
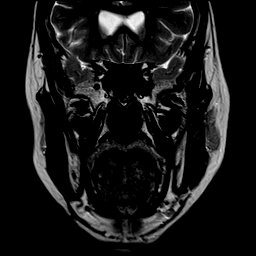
[im 35/35]
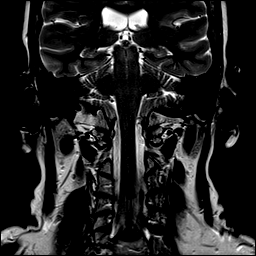

[Series 9: T1 · axial · 3.0mm · 0.31mm/px · z∈[-104,+14]mm · 5 of 37 slices shown (2 of 3)]
[im 1/37]
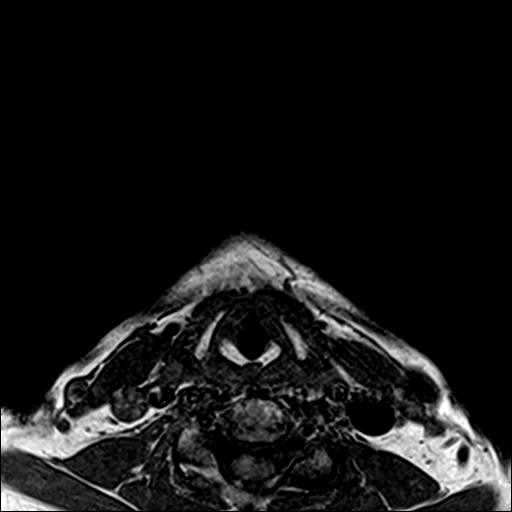
[im 10/37]
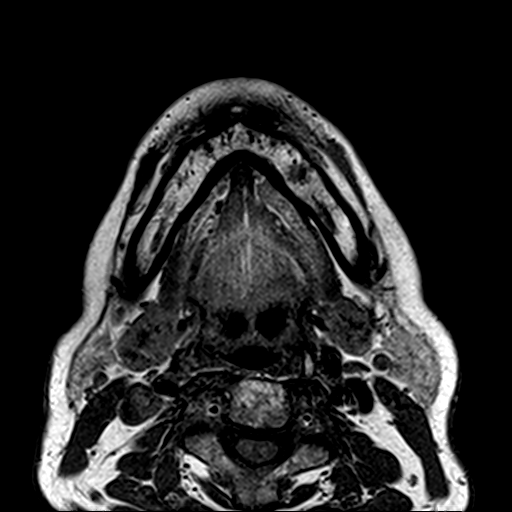
[im 19/37]
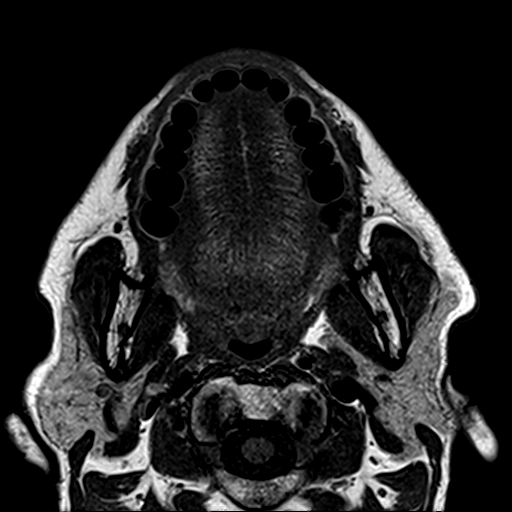
[im 28/37]
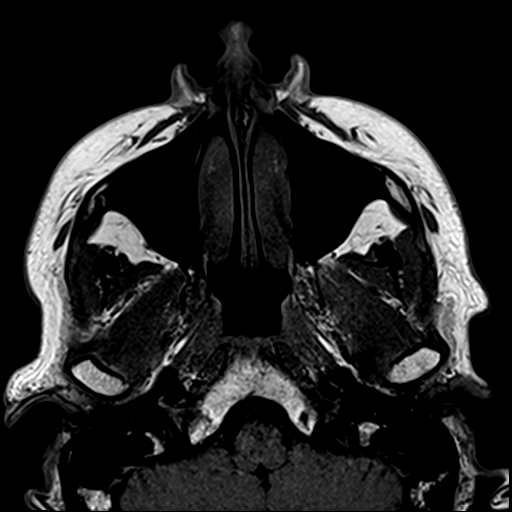
[im 37/37]
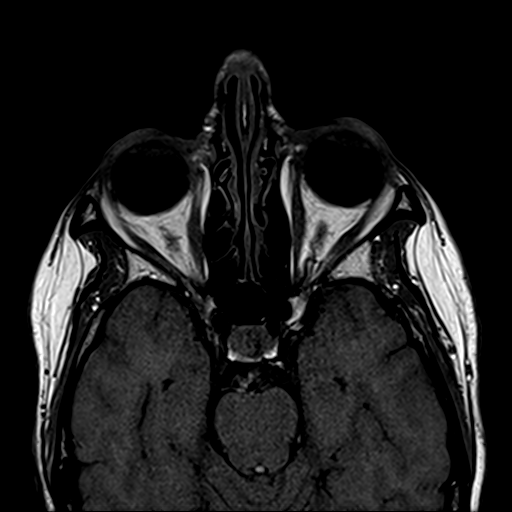

[Series 10: T1 · coronal · 3.0mm · 0.35mm/px · 5 of 39 slices shown (3 of 3)]
[im 1/39]
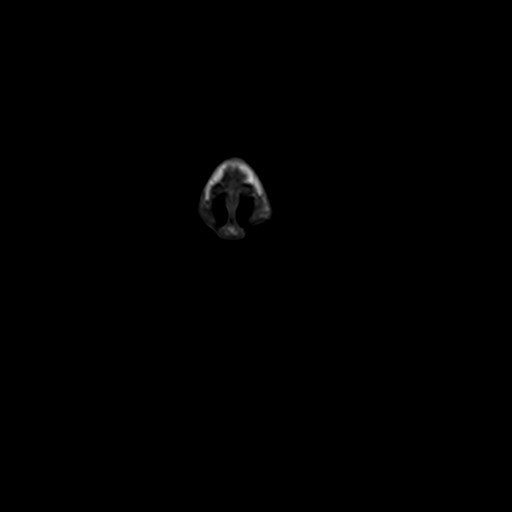
[im 10/39]
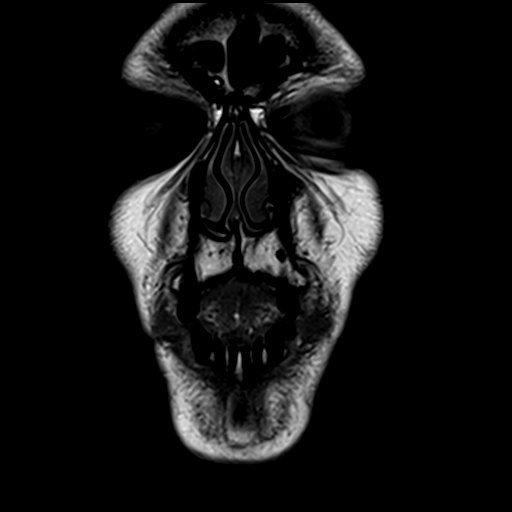
[im 20/39]
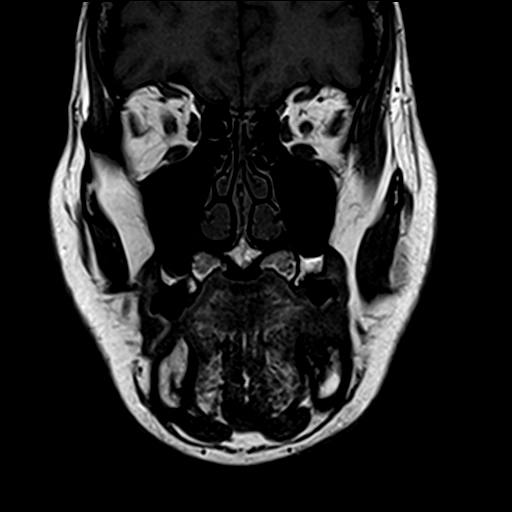
[im 29/39]
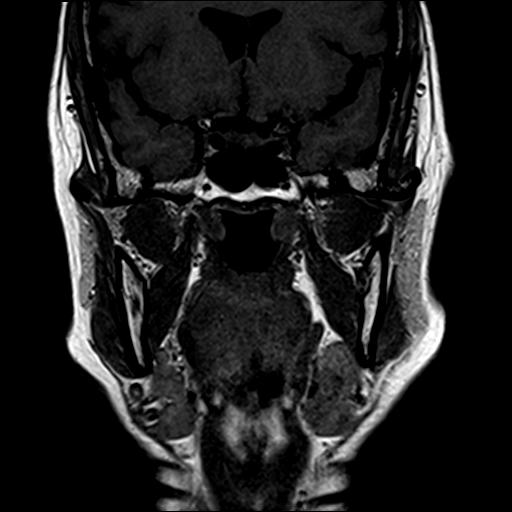
[im 39/39]
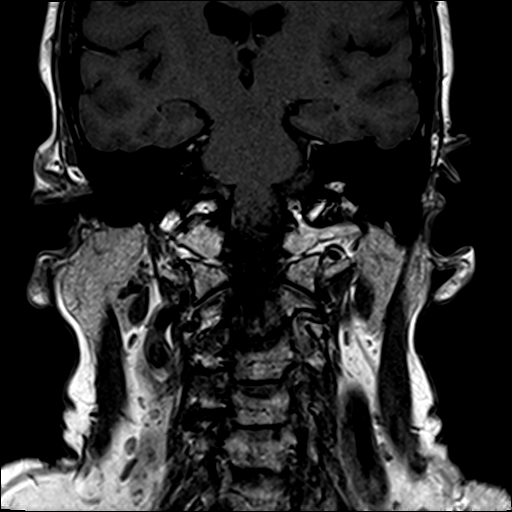

[Series 11: T1 fat-sat post-contrast · axial · 3.0mm · 0.31mm/px · z∈[-104,-15]mm · 4 of 37 slices shown]
[im 1/37]
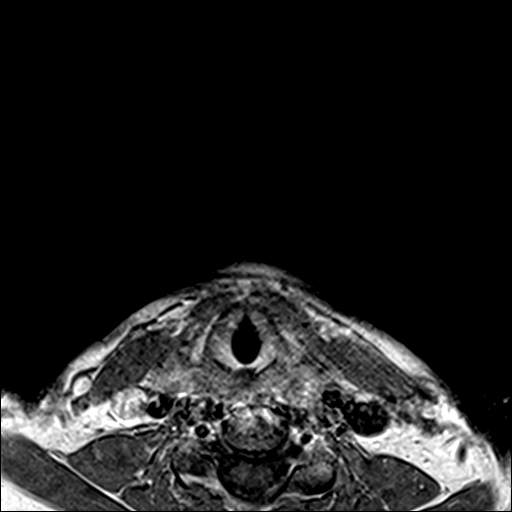
[im 10/37]
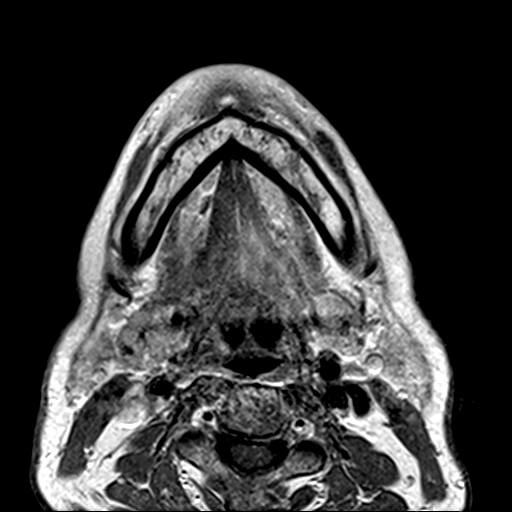
[im 19/37]
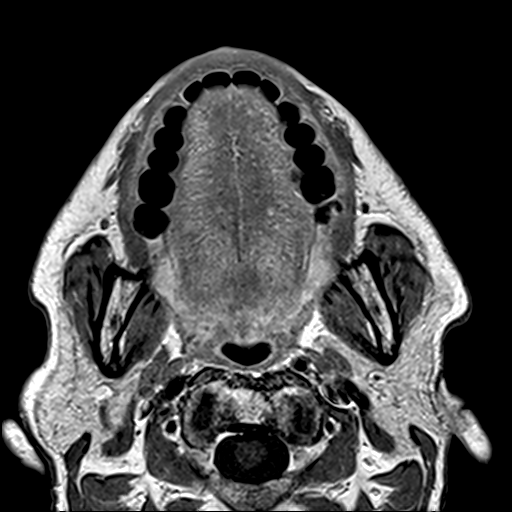
[im 28/37]
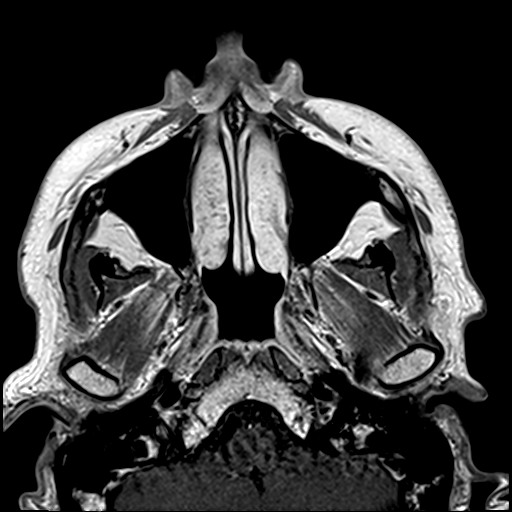

[22 of 48 positions shown; findings below may reference images not displayed]

FINDINGS: Dedicated imaging the skull base demonstrates no focal mass lesion
along the course of the trigeminal nerves bilaterally. Brainstem is
within normal limits. No pathologic enhancement is present.

The cavernous sinus is normal bilaterally. Foramina ballet and
infraorbital canal is normal.

The visualized paranasal sinuses and mastoid air cells are clear.
Visualized globes and orbits are normal as well.

The upper cervical spine is within normal limits. Marrow signal is
normal.

Salivary glands are within normal limits. The upper aerodigestive
tract is within normal limits. No significant adenopathy is present.
IMPRESSION: 1. Normal MRI of the skull base to evaluate the trigeminal nerve. No
acute or focal lesion to explain the patient's facial pain.

## 2018-03-17 MED ORDER — GADOBUTROL 1 MMOL/ML IV SOLN
7.5000 mL | Freq: Once | INTRAVENOUS | Status: AC | PRN
  Administered 2018-03-17: 7.5 mL via INTRAVENOUS

## 2018-03-18 ENCOUNTER — Other Ambulatory Visit: Payer: Self-pay | Admitting: Pulmonary Disease

## 2018-03-20 ENCOUNTER — Telehealth: Payer: Self-pay | Admitting: Pulmonary Disease

## 2018-03-20 MED ORDER — ALBUTEROL SULFATE HFA 108 (90 BASE) MCG/ACT IN AERS: 2.0000 | INHALATION_SPRAY | RESPIRATORY_TRACT | 0 refills | Status: DC | PRN

## 2018-03-20 NOTE — Telephone Encounter (Signed)
Spoke with pt. She is requesting a refill on Ventolin. Advised her that she would need an appointment. OV has been scheduled for 03/22/18 at 1:30pm. Rx has been sent in. Nothing further was needed.

## 2018-03-22 ENCOUNTER — Ambulatory Visit (INDEPENDENT_AMBULATORY_CARE_PROVIDER_SITE_OTHER): Payer: Medicare Other | Admitting: Pulmonary Disease

## 2018-03-22 ENCOUNTER — Encounter: Payer: Self-pay | Admitting: Pulmonary Disease

## 2018-03-22 VITALS — BP 124/78 | HR 82 | Ht 65.0 in | Wt 166.4 lb

## 2018-03-22 DIAGNOSIS — J4520 Mild intermittent asthma, uncomplicated: Secondary | ICD-10-CM

## 2018-03-22 DIAGNOSIS — R05 Cough: Secondary | ICD-10-CM | POA: Diagnosis not present

## 2018-03-22 DIAGNOSIS — R053 Chronic cough: Secondary | ICD-10-CM

## 2018-03-22 MED ORDER — ALBUTEROL SULFATE HFA 108 (90 BASE) MCG/ACT IN AERS: 2.0000 | INHALATION_SPRAY | RESPIRATORY_TRACT | 5 refills | Status: AC | PRN

## 2018-03-22 NOTE — Patient Instructions (Signed)
Chronic recurrent cough: I think you are doing a good job with managing ambient air temperature with the facemask and cough drops Continue to try to suppress the cough when it occurs  Asthma: Keep using albuterol as needed for chest tightness wheezing or shortness of breath  We will see you back in 6 months or sooner if needed

## 2018-03-22 NOTE — Progress Notes (Signed)
Subjective:    Patient ID: Laura Underwood, female    DOB: [birth year], 67 y.o.   MRN: [id number]  Synopsis: Laura Underwood reestablish care with [facility] in 2015 for chronic cough with upper airway cough syndrome, positive methacholine challenge test, and possible acid reflux. Previously treated with gabapentin and Elavil for laryngeal sensitivity leading to cough. She had a normal simple spirometry test in 2012.  She has had a history of a negative esophageal pH probe, chest x-ray in June 2015 was normal.  HPI  Chief Complaint  Patient presents with  . Follow-up    doing much better overall - uses inhaler as needed but feels she is doing well   Laura Underwood is doing very well.  She has been wearing a facemask more when the weather changes.  This helps her suppress her cough.  She will occasionally use her albuterol inhaler.  She doesn't think that the study drug (unclear if placebo or not) helped and her cough was actually worse.  She is better on her current medicine.   She has lost a lot of weight and she says that this hahs really ehlped a lot.  She says that her appetitie has been down and her diverticulosis has been more of a problem.  She has been diagnosed with trigeminal neuralgia, she said that pain medicine never really helped.  The pain would actually keep her up at night.    Past Medical History:  Diagnosis Date  . Abdominal pain, other specified site   . Arthritis   . Asthma    Dr Lake Bells  . Chronic cough    s/p evaluation by allergist and pulmonologist   . Colon polyp   . Cough   . Diabetes mellitus without complication ([place])   . Diverticulosis   . Dizziness   . GERD (gastroesophageal reflux disease)   . Goiter   . Headache(784.0)   . Hemorrhoids   . Hiatal hernia   . Irritable bowel disease   . Menopause   . Shortness of breath   . Stress   . Wears glasses      Review of Systems  Constitutional: Negative for chills, fatigue and fever.  HENT: Positive  for postnasal drip. Negative for sneezing and sore throat.   Respiratory: Positive for cough. Negative for shortness of breath and wheezing.        Objective:   Physical Exam Vitals:   03/22/18 1339  BP: 124/78  Pulse: 82  SpO2: 100%  Weight: 166 lb 6.4 oz (75.5 kg)  Height: 5\' 5"  (1.651 m)    Gen: well appearing HENT: OP clear, TM's clear, neck supple PULM: CTA B, normal percussion CV: RRR, no mgr, trace edema GI: BS+, soft, nontender Derm: no cyanosis or rash Psyche: normal mood and affect    BMET    Component Value Date/Time   NA 139 04/18/2017 1137   K 4.2 04/18/2017 1137   CL 103 04/18/2017 1137   CO2 30 04/18/2017 1137   GLUCOSE 100 (H) 04/18/2017 1137   BUN 10 04/18/2017 1137   CREATININE 0.64 04/18/2017 1137   CALCIUM 9.4 04/18/2017 1137        Assessment & Plan:   Chronic cough  Mild intermittent asthma, unspecified whether complicated  Discussion: This has been a stable interval for her and she has not had an exacerbation of her cough since the last visit.  She has been managing it quite well with behavioral means at home.  Her asthma  has not been a problem for her.  Plan: Chronic recurrent cough: I think you are doing a good job with managing ambient air temperature with the facemask and cough drops Continue to try to suppress the cough when it occurs  Asthma: Keep using albuterol as needed for chest tightness wheezing or shortness of breath  We will see you back in 6 months or sooner if needed    Current Outpatient Medications:  .  albuterol (PROVENTIL HFA;VENTOLIN HFA) 108 (90 Base) MCG/ACT inhaler, Inhale 2 puffs into the lungs every 4 (four) hours as needed for wheezing or shortness of breath., Disp: 1 Inhaler, Rfl: 5 .  amLODipine (NORVASC) 2.5 MG tablet, TAKE 1 TABLET BY MOUTH EVERY DAY, Disp: 90 tablet, Rfl: 1 .  aspirin 81 MG tablet, Take 81 mg by mouth daily., Disp: , Rfl:  .  cetirizine (ZYRTEC) 10 MG tablet, Take 1 mg by mouth  daily., Disp: , Rfl:  .  Cholecalciferol (VITAMIN D PO), Take by mouth daily., Disp: , Rfl:  .  Cinnamon 500 MG capsule, Take 500 mg by mouth daily., Disp: , Rfl:  .  docusate sodium (COLACE) 100 MG capsule, Take 100 mg by mouth 2 (two) times daily., Disp: , Rfl:  .  folic acid (FOLVITE) 165 MCG tablet, Take 400 mcg by mouth daily., Disp: , Rfl:  .  hydroxypropyl methylcellulose / hypromellose (ISOPTO TEARS / GONIOVISC) 2.5 % ophthalmic solution, Place 1 drop into both eyes as needed for dry eyes., Disp: , Rfl:  .  metFORMIN (GLUCOPHAGE XR) 500 MG 24 hr tablet, Start 500mg  PO qpm., Disp: 90 tablet, Rfl: 3 .  Misc Natural Products (TART CHERRY ADVANCED PO), Take 1,200 mg by mouth daily., Disp: , Rfl:  .  Multiple Vitamin (MULTIVITAMIN) tablet, Take 1 tablet by mouth daily., Disp: , Rfl:  .  Omega-3 Fatty Acids (FISH OIL) 1200 MG CAPS, Take by mouth., Disp: , Rfl:  .  rosuvastatin (CRESTOR) 10 MG tablet, Take 1 tablet (10 mg total) daily by mouth., Disp: 90 tablet, Rfl: 3 .  TURMERIC PO, Take by mouth., Disp: , Rfl:

## 2018-03-30 ENCOUNTER — Other Ambulatory Visit: Payer: Self-pay | Admitting: Family

## 2018-03-30 DIAGNOSIS — E119 Type 2 diabetes mellitus without complications: Secondary | ICD-10-CM

## 2018-04-09 ENCOUNTER — Other Ambulatory Visit: Payer: Self-pay | Admitting: Family

## 2018-04-09 DIAGNOSIS — E785 Hyperlipidemia, unspecified: Secondary | ICD-10-CM

## 2018-04-24 ENCOUNTER — Ambulatory Visit (INDEPENDENT_AMBULATORY_CARE_PROVIDER_SITE_OTHER): Payer: Medicare Other | Admitting: Family

## 2018-04-24 ENCOUNTER — Encounter: Payer: Self-pay | Admitting: Family

## 2018-04-24 VITALS — BP 130/90 | HR 73 | Temp 98.1°F | Resp 16 | Ht 65.0 in | Wt 170.1 lb

## 2018-04-24 DIAGNOSIS — E119 Type 2 diabetes mellitus without complications: Secondary | ICD-10-CM | POA: Diagnosis not present

## 2018-04-24 DIAGNOSIS — R05 Cough: Secondary | ICD-10-CM | POA: Diagnosis not present

## 2018-04-24 DIAGNOSIS — I10 Essential (primary) hypertension: Secondary | ICD-10-CM

## 2018-04-24 DIAGNOSIS — R053 Chronic cough: Secondary | ICD-10-CM

## 2018-04-24 DIAGNOSIS — E785 Hyperlipidemia, unspecified: Secondary | ICD-10-CM

## 2018-04-24 LAB — COMPREHENSIVE METABOLIC PANEL
ALBUMIN: 4.4 g/dL (ref 3.5–5.2)
ALK PHOS: 43 U/L (ref 39–117)
ALT: 17 U/L (ref 0–35)
AST: 20 U/L (ref 0–37)
BILIRUBIN TOTAL: 0.4 mg/dL (ref 0.2–1.2)
BUN: 10 mg/dL (ref 6–23)
CO2: 29 mEq/L (ref 19–32)
CREATININE: 0.64 mg/dL (ref 0.40–1.20)
Calcium: 9.2 mg/dL (ref 8.4–10.5)
Chloride: 106 mEq/L (ref 96–112)
GFR: 119.27 mL/min (ref >60.00)
Glucose, Bld: 99 mg/dL (ref 70–99)
POTASSIUM: 4.2 meq/L (ref 3.5–5.1)
SODIUM: 142 meq/L (ref 135–145)
TOTAL PROTEIN: 7.2 g/dL (ref 6.0–8.3)

## 2018-04-24 LAB — HEMOGLOBIN A1C: Hgb A1c MFr Bld: 6.3 % (ref 4.6–6.5)

## 2018-04-24 MED ORDER — AMLODIPINE BESYLATE 2.5 MG PO TABS: 2.5000 mg | ORAL_TABLET | Freq: Every day | ORAL | 1 refills | Status: DC

## 2018-04-24 MED ORDER — METFORMIN HCL ER 500 MG PO TB24: ORAL_TABLET | ORAL | 0 refills | Status: DC

## 2018-04-24 MED ORDER — ROSUVASTATIN CALCIUM 10 MG PO TABS: 10.0000 mg | ORAL_TABLET | Freq: Every day | ORAL | 3 refills | Status: AC

## 2018-04-24 NOTE — Assessment & Plan Note (Signed)
Improved. Will follow 

## 2018-04-24 NOTE — Assessment & Plan Note (Signed)
Suspect controlled. Pending a1c 

## 2018-04-24 NOTE — Assessment & Plan Note (Signed)
Reasonably controlled. Discussed newest BP guidelines. Patient will monitor.

## 2018-04-24 NOTE — Progress Notes (Signed)
Subjective:    Patient ID: Laura Underwood, female    DOB: July 24, 1950, 67 y.o.   MRN: [id number]  CC: Laura Underwood is a 67 y.o. female who presents today for follow up.   HPI:  Pleased with weight loss which has been intentional. Following healthy diet.   HTN- compliant with medication.  Denies exertional chest pain or pressure, numbness or tingling radiating to left arm or jaw, palpitations, dizziness, frequent headaches, changes in vision, or shortness of breath.   Dm- compliant with metformin.   Cough- Improved with cough drops. follows with [facility]. Completed research study with [facility]  Has been diagnosed trigeminal neuralgia- episode of left sided jaw and ear pain 5 months ago, since resolved. Following with Dr Manuella Ghazi. Prescribed tegretol however hasnt needed to take yet.    HISTORY:  Past Medical History:  Diagnosis Date  . Abdominal pain, other specified site   . Arthritis   . Asthma    Dr Lake Bells  . Chronic cough    s/p evaluation by allergist and pulmonologist   . Colon polyp   . Cough   . Diabetes mellitus without complication ([place])   . Diverticulosis   . Dizziness   . GERD (gastroesophageal reflux disease)   . Goiter   . Headache(784.0)   . Hemorrhoids   . Hiatal hernia   . Irritable bowel disease   . Menopause   . Shortness of breath   . Stress   . Wears glasses    Past Surgical History:  Procedure Laterality Date  . BRAVO [place] STUDY N/A 08/15/2013   Procedure: BRAVO [place] STUDY;  Surgeon: Lear Ng, MD;  Location: [facility];  Service: Endoscopy;  Laterality: N/A;  . CESAREAN SECTION  [phone]  . CESAREAN SECTION  [phone]  . CHOLECYSTECTOMY  09/13/14  . COLONOSCOPY N/A 08/15/2013   Procedure: COLONOSCOPY;  Surgeon: Lear Ng, MD;  Location: [facility];  Service: Endoscopy;  Laterality: N/A;  . ESOPHAGOGASTRODUODENOSCOPY N/A 08/15/2013   Procedure: ESOPHAGOGASTRODUODENOSCOPY (EGD);  Surgeon: Lear Ng, MD;  Location: [facility];  Service: Endoscopy;  Laterality: N/A;  . jaw abscess  jan 2012  . LAPAROSCOPY  1981  . ovarin cyst  2004   ovary removed   Family History  Problem Relation Age of Onset  . Stroke Mother   . Hypertension Mother   . Hyperlipidemia Mother   . Depression Mother   . Hypertension Sister   . Prostate cancer Father   . Liver cancer Father        metastatic liver  . Breast cancer Maternal Aunt   . Depression Maternal Aunt   . Depression Maternal Grandfather   . Depression Cousin   . Pancreatic cancer Paternal Aunt   . Pancreatic cancer Paternal Grandmother     Allergies: Latex; Nickel; Benzalkonium chloride; and Neosporin [neomycin-bacitracin zn-polymyx] Current Outpatient Medications on File Prior to Visit  Medication Sig Dispense Refill  . albuterol (PROVENTIL HFA;VENTOLIN HFA) 108 (90 Base) MCG/ACT inhaler Inhale 2 puffs into the lungs every 4 (four) hours as needed for wheezing or shortness of breath. 1 Inhaler 5  . aspirin 81 MG tablet Take 81 mg by mouth daily.    . carbamazepine (TEGRETOL) 200 MG tablet Take 200 mg by mouth 2 (two) times daily.   3  . cetirizine (ZYRTEC) 10 MG tablet Take 1 mg by mouth daily.    . Cholecalciferol (VITAMIN D PO) Take by mouth daily.    . Cinnamon 500  MG capsule Take 500 mg by mouth daily.    Marland Kitchen docusate sodium (COLACE) 100 MG capsule Take 100 mg by mouth 2 (two) times daily.    . folic acid (FOLVITE) 497 MCG tablet Take 400 mcg by mouth daily.    . hydroxypropyl methylcellulose / hypromellose (ISOPTO TEARS / GONIOVISC) 2.5 % ophthalmic solution Place 1 drop into both eyes as needed for dry eyes.    . Misc Natural Products (TART CHERRY ADVANCED PO) Take 1,200 mg by mouth daily.    . Multiple Vitamin (MULTIVITAMIN) tablet Take 1 tablet by mouth daily.    . Omega-3 Fatty Acids (FISH OIL) 1200 MG CAPS Take by mouth.    . TURMERIC PO Take by mouth.     No current facility-administered medications on file prior to visit.     Social  History   Tobacco Use  . Smoking status: Never Smoker  . Smokeless tobacco: Never Used  Substance Use Topics  . Alcohol use: Yes    Alcohol/week: 1.0 - 2.0 standard drinks    Types: 1 - 2 Cans of beer per week    Comment: rarely  . Drug use: No    Review of Systems  Constitutional: Negative for chills and fever.  Respiratory: Negative for cough.   Cardiovascular: Negative for chest pain and palpitations.  Gastrointestinal: Negative for nausea and vomiting.  Neurological: Negative for headaches.      Objective:    BP 130/90 (BP Location: Left Arm, Patient Position: Sitting, Cuff Size: Normal)   Pulse 73   Temp 98.1 F (36.7 C) (Oral)   Resp 16   Ht 5\' 5"  (1.651 m)   Wt 170 lb 2 oz (77.2 kg)   SpO2 100%   BMI 28.31 kg/m  BP Readings from Last 3 Encounters:  04/24/18 130/90  03/22/18 124/78  08/15/17 120/86   Wt Readings from Last 3 Encounters:  04/24/18 170 lb 2 oz (77.2 kg)  03/22/18 166 lb 6.4 oz (75.5 kg)  08/15/17 170 lb 6 oz (77.3 kg)    Physical Exam  Constitutional: She appears well-developed and well-nourished.  Eyes: Conjunctivae are normal.  Cardiovascular: Normal rate, regular rhythm, normal heart sounds and normal pulses.  Pulmonary/Chest: Effort normal and breath sounds normal. She has no wheezes. She has no rhonchi. She has no rales.  Neurological: She is alert.  Skin: Skin is warm and dry.  Psychiatric: She has a normal mood and affect. Her speech is normal and behavior is normal. Thought content normal.  Vitals reviewed.      Assessment & Plan:   Problem List Items Addressed This Visit      Cardiovascular and Mediastinum   HTN (hypertension) - Primary    Reasonably controlled. Discussed newest BP guidelines. Patient will monitor.       Relevant Medications   rosuvastatin (CRESTOR) 10 MG tablet   amLODipine (NORVASC) 2.5 MG tablet   Other Relevant Orders   Comprehensive metabolic panel (Completed)     Endocrine   Diabetes mellitus  type 2, controlled ([place])    Suspect controlled. Pending a1c.       Relevant Medications   metFORMIN (GLUCOPHAGE-XR) 500 MG 24 hr tablet   rosuvastatin (CRESTOR) 10 MG tablet   Other Relevant Orders   Hemoglobin A1c (Completed)     Other   Chronic cough    Improved. Will follow.       HLD (hyperlipidemia)   Relevant Medications   rosuvastatin (CRESTOR) 10 MG tablet  amLODipine (NORVASC) 2.5 MG tablet       I have changed Sagrario B. Basley's rosuvastatin and amLODipine. I am also having her maintain her Fish Oil, multivitamin, folic acid, Misc Natural Products (TART CHERRY ADVANCED PO), aspirin, docusate sodium, Cholecalciferol (VITAMIN D PO), hydroxypropyl methylcellulose / hypromellose, TURMERIC PO, Cinnamon, cetirizine, albuterol, carbamazepine, and metFORMIN.   Meds ordered this encounter  Medications  . metFORMIN (GLUCOPHAGE-XR) 500 MG 24 hr tablet    Sig: START 1 TABLET EVERY EVENING    Dispense:  90 tablet    Refill:  0  . rosuvastatin (CRESTOR) 10 MG tablet    Sig: Take 1 tablet (10 mg total) by mouth daily.    Dispense:  90 tablet    Refill:  3  . amLODipine (NORVASC) 2.5 MG tablet    Sig: Take 1 tablet (2.5 mg total) by mouth daily.    Dispense:  90 tablet    Refill:  1    Return precautions given.   Risks, benefits, and alternatives of the medications and treatment plan prescribed today were discussed, and patient expressed understanding.   Education regarding symptom management and diagnosis given to patient on AVS.  Continue to follow with Burnard Hawthorne, FNP for routine health maintenance.   Laura Underwood and I agreed with plan.   Mable Paris, FNP

## 2018-04-24 NOTE — Patient Instructions (Addendum)
Monitor blood pressure,  Goal is less than 120/80, based on newest guidelines; if persistently higher, please make sooner follow up appointment so we can recheck you blood pressure and manage medications  If blood pressure elevated, start taking amlodipine 5mg  once per day.   Always a pleasure seeing you

## 2018-05-31 ENCOUNTER — Ambulatory Visit: Payer: Medicare Other | Admitting: Podiatry

## 2018-06-01 ENCOUNTER — Encounter: Payer: Self-pay | Admitting: Family

## 2018-06-01 DIAGNOSIS — Z1231 Encounter for screening mammogram for malignant neoplasm of breast: Secondary | ICD-10-CM | POA: Diagnosis not present

## 2018-06-01 DIAGNOSIS — Z803 Family history of malignant neoplasm of breast: Secondary | ICD-10-CM | POA: Diagnosis not present

## 2018-06-02 ENCOUNTER — Telehealth: Payer: Self-pay | Admitting: Family

## 2018-06-02 NOTE — Telephone Encounter (Signed)
Pt given results per notes of Mable Paris, FNP below on 06/02/18. Pt verbalized understanding.

## 2018-06-02 NOTE — Telephone Encounter (Signed)
Call pt Normal mammogram from [facility].  Repeat in one year

## 2018-06-02 NOTE — Telephone Encounter (Signed)
LMTCB

## 2018-06-19 ENCOUNTER — Encounter: Payer: Self-pay | Admitting: Podiatry

## 2018-06-19 ENCOUNTER — Ambulatory Visit (INDEPENDENT_AMBULATORY_CARE_PROVIDER_SITE_OTHER): Payer: Medicare Other | Admitting: Podiatry

## 2018-06-19 DIAGNOSIS — G500 Trigeminal neuralgia: Secondary | ICD-10-CM | POA: Diagnosis not present

## 2018-06-19 DIAGNOSIS — E119 Type 2 diabetes mellitus without complications: Secondary | ICD-10-CM | POA: Diagnosis not present

## 2018-06-19 NOTE — Progress Notes (Signed)
She presents today for follow-up of her diabetic foot exam.  She relates no complications.  States that she has occasional burning and tingling in her feet and toes.  Objective: Vital signs are stable alert and oriented x3.  Pulses are palpable.  Neurologic sensorium is intact deep tendon reflexes are intact muscle strength is normal symmetrical bilateral.  Orthopedic evaluation of straight all joints distal ankle full range of motion no crepitation.  Cutaneous evaluation demonstrates supple well-hydrated cutis no erythema edema cellulitis drainage or odor.  Assessment: Diabetes non-complicated foot exam.  Plan: Discussed etiology pathology conservative surgical therapies follow-up with me in 1 year.

## 2018-09-24 ENCOUNTER — Other Ambulatory Visit: Payer: Self-pay | Admitting: Family

## 2018-09-24 DIAGNOSIS — E119 Type 2 diabetes mellitus without complications: Secondary | ICD-10-CM

## 2018-09-26 ENCOUNTER — Ambulatory Visit: Payer: Medicare Other | Admitting: Pulmonary Disease

## 2018-10-16 DIAGNOSIS — J4520 Mild intermittent asthma, uncomplicated: Secondary | ICD-10-CM | POA: Diagnosis not present

## 2018-10-16 DIAGNOSIS — Z0000 Encounter for general adult medical examination without abnormal findings: Secondary | ICD-10-CM | POA: Diagnosis not present

## 2018-10-16 DIAGNOSIS — G500 Trigeminal neuralgia: Secondary | ICD-10-CM | POA: Insufficient documentation

## 2018-10-16 DIAGNOSIS — E7800 Pure hypercholesterolemia, unspecified: Secondary | ICD-10-CM | POA: Diagnosis not present

## 2018-10-16 DIAGNOSIS — I10 Essential (primary) hypertension: Secondary | ICD-10-CM | POA: Diagnosis not present

## 2018-10-16 DIAGNOSIS — Z7689 Persons encountering health services in other specified circumstances: Secondary | ICD-10-CM | POA: Diagnosis not present

## 2018-10-16 DIAGNOSIS — E119 Type 2 diabetes mellitus without complications: Secondary | ICD-10-CM | POA: Diagnosis not present

## 2018-10-16 DIAGNOSIS — E041 Nontoxic single thyroid nodule: Secondary | ICD-10-CM | POA: Diagnosis not present

## 2018-10-23 ENCOUNTER — Ambulatory Visit: Payer: Medicare Other | Admitting: Family

## 2018-11-08 DIAGNOSIS — E0590 Thyrotoxicosis, unspecified without thyrotoxic crisis or storm: Secondary | ICD-10-CM | POA: Diagnosis not present

## 2018-11-08 DIAGNOSIS — E0520 Thyrotoxicosis with toxic multinodular goiter without thyrotoxic crisis or storm: Secondary | ICD-10-CM | POA: Diagnosis not present

## 2018-11-10 ENCOUNTER — Ambulatory Visit: Payer: Medicare Other | Admitting: Pulmonary Disease

## 2018-11-15 DIAGNOSIS — E0520 Thyrotoxicosis with toxic multinodular goiter without thyrotoxic crisis or storm: Secondary | ICD-10-CM | POA: Diagnosis not present

## 2018-11-15 DIAGNOSIS — E0590 Thyrotoxicosis, unspecified without thyrotoxic crisis or storm: Secondary | ICD-10-CM | POA: Diagnosis not present

## 2019-02-21 DIAGNOSIS — Z23 Encounter for immunization: Secondary | ICD-10-CM | POA: Diagnosis not present

## 2019-04-09 DIAGNOSIS — E119 Type 2 diabetes mellitus without complications: Secondary | ICD-10-CM | POA: Diagnosis not present

## 2019-04-09 DIAGNOSIS — E7800 Pure hypercholesterolemia, unspecified: Secondary | ICD-10-CM | POA: Diagnosis not present

## 2019-04-09 DIAGNOSIS — G500 Trigeminal neuralgia: Secondary | ICD-10-CM | POA: Diagnosis not present

## 2019-04-09 DIAGNOSIS — I10 Essential (primary) hypertension: Secondary | ICD-10-CM | POA: Diagnosis not present

## 2019-04-09 DIAGNOSIS — E041 Nontoxic single thyroid nodule: Secondary | ICD-10-CM | POA: Diagnosis not present

## 2019-04-09 DIAGNOSIS — J4520 Mild intermittent asthma, uncomplicated: Secondary | ICD-10-CM | POA: Diagnosis not present

## 2019-04-16 DIAGNOSIS — E119 Type 2 diabetes mellitus without complications: Secondary | ICD-10-CM | POA: Diagnosis not present

## 2019-04-16 DIAGNOSIS — Z79899 Other long term (current) drug therapy: Secondary | ICD-10-CM | POA: Diagnosis not present

## 2019-04-16 DIAGNOSIS — Z7984 Long term (current) use of oral hypoglycemic drugs: Secondary | ICD-10-CM | POA: Diagnosis not present

## 2019-04-16 DIAGNOSIS — E785 Hyperlipidemia, unspecified: Secondary | ICD-10-CM | POA: Diagnosis not present

## 2019-04-16 DIAGNOSIS — N393 Stress incontinence (female) (male): Secondary | ICD-10-CM | POA: Diagnosis not present

## 2019-04-16 DIAGNOSIS — J4520 Mild intermittent asthma, uncomplicated: Secondary | ICD-10-CM | POA: Diagnosis not present

## 2019-04-16 DIAGNOSIS — Z23 Encounter for immunization: Secondary | ICD-10-CM | POA: Diagnosis not present

## 2019-04-16 DIAGNOSIS — G500 Trigeminal neuralgia: Secondary | ICD-10-CM | POA: Diagnosis not present

## 2019-04-16 DIAGNOSIS — I10 Essential (primary) hypertension: Secondary | ICD-10-CM | POA: Diagnosis not present

## 2019-05-09 ENCOUNTER — Other Ambulatory Visit: Payer: Self-pay

## 2019-06-18 ENCOUNTER — Ambulatory Visit: Payer: BLUE CROSS/BLUE SHIELD | Admitting: Podiatry

## 2019-06-20 ENCOUNTER — Ambulatory Visit (INDEPENDENT_AMBULATORY_CARE_PROVIDER_SITE_OTHER): Payer: Medicare Other | Admitting: Podiatry

## 2019-06-20 ENCOUNTER — Encounter: Payer: Self-pay | Admitting: Podiatry

## 2019-06-20 ENCOUNTER — Other Ambulatory Visit: Payer: Self-pay

## 2019-06-20 DIAGNOSIS — E119 Type 2 diabetes mellitus without complications: Secondary | ICD-10-CM | POA: Diagnosis not present

## 2019-06-20 NOTE — Progress Notes (Signed)
She presents today for a diabetic check.  States that her last A1c was somewhere around the 6.3.  She denies any pain in her feet or legs.  She denies any trauma.  ROS: Denies fever chills nausea vomiting muscle aches pains calf pain back pain chest pain shortness of breath.  Objective: Vital signs are stable she is alert and oriented x3.  Pulses are palpable.  Neurologic sensorium is only mildly diminished per Semmes Weinstein monofilament bilaterally and vibratory sensation bilaterally.  She has good inversion eversion dorsiflexion and plantarflexion deep tendon reflexes are intact.  Muscle strength is normal and symmetrical bilateral.  Cutaneous evaluation of straits supple well-hydrated cutis no open lesions or wounds are noted.  Orthopedic evaluation demonstrates rectus foot type no significant abnormalities.  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  Plan: Diabetic education was discussed today and I will follow-up with her in 1 year.

## 2019-07-26 ENCOUNTER — Ambulatory Visit: Payer: Medicare Other | Attending: Internal Medicine

## 2019-07-26 DIAGNOSIS — Z23 Encounter for immunization: Secondary | ICD-10-CM | POA: Insufficient documentation

## 2019-07-26 NOTE — Progress Notes (Signed)
   Covid-19 Vaccination Clinic  Name:  Laura Underwood    MRN: [id number] DOB: Apr 06, 1951  07/26/2019  Laura Underwood was observed post Covid-19 immunization for 15 minutes without incidence. She was provided with Vaccine Information Sheet and instruction to access the V-Safe system.   Laura Underwood was instructed to call 911 with any severe reactions post vaccine: Marland Kitchen Difficulty breathing  . Swelling of your face and throat  . A fast heartbeat  . A bad rash all over your body  . Dizziness and weakness    Immunizations Administered    Name Date Dose VIS Date Route   Pfizer COVID-19 Vaccine 07/26/2019  8:26 AM 0.3 mL 05/25/2019 Intramuscular   Manufacturer: [place]   Lot: [id number]   Portal: [id number]

## 2019-08-22 ENCOUNTER — Ambulatory Visit: Payer: Medicare Other | Attending: Internal Medicine

## 2019-08-22 DIAGNOSIS — Z23 Encounter for immunization: Secondary | ICD-10-CM

## 2019-08-22 NOTE — Progress Notes (Signed)
   Covid-19 Vaccination Clinic  Name:  Laura Underwood    MRN: [id number] DOB: 1951/02/03  08/22/2019  Laura Underwood was observed post Covid-19 immunization for 15 minutes without incident. She was provided with Vaccine Information Sheet and instruction to access the V-Safe system.   Laura Underwood was instructed to call 911 with any severe reactions post vaccine: Marland Kitchen Difficulty breathing  . Swelling of face and throat  . A fast heartbeat  . A bad rash all over body  . Dizziness and weakness   Immunizations Administered    Name Date Dose VIS Date Route   Pfizer COVID-19 Vaccine 08/22/2019  8:11 AM 0.3 mL 05/25/2019 Intramuscular   Manufacturer: [place]   Lot: [id number]   [place]: [id number]

## 2020-02-13 ENCOUNTER — Other Ambulatory Visit: Payer: Self-pay

## 2020-02-13 ENCOUNTER — Ambulatory Visit (INDEPENDENT_AMBULATORY_CARE_PROVIDER_SITE_OTHER): Payer: Medicare Other | Admitting: Podiatry

## 2020-02-13 ENCOUNTER — Encounter: Payer: Self-pay | Admitting: Podiatry

## 2020-02-13 DIAGNOSIS — L600 Ingrowing nail: Secondary | ICD-10-CM

## 2020-02-13 NOTE — Patient Instructions (Signed)
Betadine Soak Instructions  Purchase an 8 oz. bottle of BETADINE solution (Povidone)  THE DAY AFTER THE PROCEDURE  Place 1 tablespoon of betadine solution in a quart of warm tap water.  Submerge your foot or feet with outer bandage intact for the initial soak; this will allow the bandage to become moist and wet for easy lift off.  Once you remove your bandage, continue to soak in the solution for 20 minutes.  This soak should be done twice a day.  Next, remove your foot or feet from solution, blot dry the affected area and cover.  You may use a band aid large enough to cover the area or use gauze and tape.  Apply bacitracin to the toe after soaks.  IF YOUR SKIN BECOMES IRRITATED WHILE USING THESE INSTRUCTIONS, IT IS OKAY TO SWITCH TO EPSOM SALTS AND WATER OR WHITE VINEGAR AND WATER.   [place] Instructions-Post Nail Surgery  You have had your ingrown toenail and root treated with a chemical.  This chemical causes a burn that will drain and ooze like a blister.  This can drain for 6-8 weeks or longer.  It is important to keep this area clean, covered, and follow the soaking instructions dispensed at the time of your surgery.  This area will eventually dry and form a scab.  Once the scab forms you no longer need to soak or apply a dressing.  If at any time you experience an increase in pain, redness, swelling, or drainage, you should contact the office as soon as possible.

## 2020-02-13 NOTE — Progress Notes (Signed)
She presents today for chief complaint of painful ingrown toenail to the tibiofibular border of the hallux left.  He states that these areas have a dark area on the other week and were ingrown and were red and tender.  She is tried trimming them but that it is getting too far past her.  She is concerned because she has diabetes and she does not want to have any problems associated with that.  Objective: Vital signs are stable she is alert oriented x3 pulses are strong and palpable.  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  She has numbness and tingling to the right first and second toes.  Left foot only demonstrate sharp incurvated nail margin along the tibiofibular border of the hallux left.  Exquisitely tender on palpation.  Assessment: Ingrown toenail paronychia abscess hallux left.  Plan: At this point we performed chemical matrixectomy after local anesthetic was administered she tolerated procedure well without complications.  I will follow-up with her in a couple of weeks.  She is allergic to Neosporin so she will use Bactroban ointment.  She will call with questions or concerns

## 2020-02-27 ENCOUNTER — Encounter: Payer: Self-pay | Admitting: Podiatry

## 2020-02-27 ENCOUNTER — Ambulatory Visit (INDEPENDENT_AMBULATORY_CARE_PROVIDER_SITE_OTHER): Payer: Medicare Other | Admitting: Podiatry

## 2020-02-27 ENCOUNTER — Other Ambulatory Visit: Payer: Self-pay

## 2020-02-27 DIAGNOSIS — L600 Ingrowing nail: Secondary | ICD-10-CM

## 2020-02-27 DIAGNOSIS — Z9889 Other specified postprocedural states: Secondary | ICD-10-CM

## 2020-02-27 DIAGNOSIS — L03032 Cellulitis of left toe: Secondary | ICD-10-CM

## 2020-02-27 NOTE — Progress Notes (Signed)
She presents today for follow-up of her matrixectomy's hallux left she states is doing a whole lot better everything is good and very happy with outcome.  Objective: Vital signs are stable she is alert and oriented x3 there is no erythema edema cellulitis drainage or odor matricectomy's appear to be healing very nicely.  Assessment: Well-healing surgical toe.  Plan: Follow-up with me only on an as-needed basis.  She will watch for signs and symptoms of infection should there be any she will notify us immediately.

## 2020-06-18 ENCOUNTER — Ambulatory Visit (INDEPENDENT_AMBULATORY_CARE_PROVIDER_SITE_OTHER): Payer: Medicare Other | Admitting: Podiatry

## 2020-06-18 ENCOUNTER — Encounter: Payer: Self-pay | Admitting: Podiatry

## 2020-06-18 ENCOUNTER — Other Ambulatory Visit: Payer: Self-pay

## 2020-06-18 DIAGNOSIS — E119 Type 2 diabetes mellitus without complications: Secondary | ICD-10-CM | POA: Diagnosis not present

## 2020-06-18 NOTE — Progress Notes (Signed)
She presents today for diabetic checkup she denies any problems with her past medical history medications allergies states that her hemoglobin A1c last report was a 6.2.  Currently having no problems.  Objective: Vital signs are stable she is alert oriented x3.  Pulses are palpable.  Neurologic Sorum is intact per Semmes Weinstein monofilament.  Deep tendon reflexes are intact bilateral.  Muscle strength is normal symmetrical 5/5 dorsiflexors plantar flexors inverters everters all intrinsic musculature is strong and intact.  Orthopedic evaluation demonstrates rectus foot bilateral.  Cutaneous evaluation demonstrates no open lesions or wounds.  Assessment: Diabetes mellitus without complication bilateral foot.  Plan: Follow-up with her in 1 year for diabetic foot exam will notify me with questions or concerns

## 2020-10-15 DIAGNOSIS — M8580 Other specified disorders of bone density and structure, unspecified site: Secondary | ICD-10-CM | POA: Insufficient documentation

## 2021-06-22 ENCOUNTER — Ambulatory Visit: Payer: Medicare Other | Admitting: Podiatry

## 2021-07-01 ENCOUNTER — Encounter: Payer: Self-pay | Admitting: Podiatry

## 2021-07-01 ENCOUNTER — Other Ambulatory Visit: Payer: Self-pay

## 2021-07-01 ENCOUNTER — Ambulatory Visit (INDEPENDENT_AMBULATORY_CARE_PROVIDER_SITE_OTHER): Payer: Medicare Other | Admitting: Podiatry

## 2021-07-01 DIAGNOSIS — E119 Type 2 diabetes mellitus without complications: Secondary | ICD-10-CM | POA: Diagnosis not present

## 2021-07-01 NOTE — Progress Notes (Signed)
She presents today for follow-up of her diabetes.  She says that her feet are doing just fine she is starting to get some numbness in her toes and along the lateral aspect of the left foot.  Her A1c has gone up by 2/10 of a point to 6.4.  This was taken November 2022.  Objective: Vital signs are stable she is alert and oriented x3.  Pulses are palpable.  Neurologic sensorium is grossly intact per Semmes Weinstein monofilament.  Deep tendon reflexes are intact muscle strength is normal and symmetrical bilateral.  Cutaneous evaluation of straits integument is intact no open lesions or wounds.  Nails appear to be relatively normal.  Orthopedic evaluation of straits all joints distal to the ankle have full range of motion without crepitation.  Assessment: Diabetes with mild diabetic peripheral neuropathy otherwise noncomplicated.  Plan: Follow-up with me in 1 year call with questions or concerns.  Instructed her on foot health and how to check her feet every day.

## 2022-07-07 ENCOUNTER — Encounter: Payer: Self-pay | Admitting: Podiatry

## 2022-07-07 ENCOUNTER — Ambulatory Visit (INDEPENDENT_AMBULATORY_CARE_PROVIDER_SITE_OTHER): Payer: Medicare Other | Admitting: Podiatry

## 2022-07-07 DIAGNOSIS — E119 Type 2 diabetes mellitus without complications: Secondary | ICD-10-CM | POA: Diagnosis not present

## 2022-07-07 NOTE — Progress Notes (Signed)
She presents today after having not seen her for a year for her diabetic checkup.  She states that she is 72 years old she is very healthy she states that she does just about anything that she wants to do.  States that she had a small pain beneath the fifth digit of her right foot.  She states that does not seem to be there now she thought maybe she had a cut or something so she put a cotton ball under her and states that it seemed to have gotten better.  States that her last hemoglobin A1c was 6.4 and denies numbness and tingling in her toes.  Objective: Vital signs stable alert and oriented x 3.  Pulses are palpable.  Neurological Sorum is intact D10 reflexes are intact muscle strength is normal and symmetrical bilateral.  Deep tendon reflexes are intact bilateral.  Cutaneous evaluation demonstrates supple well-hydrated cutis she has a small healed laceration to the fifth digit of the right foot.  There is no signs of infection.  Toenails are thickened dystrophic but well-maintained.  Assessment: Diabetes mellitus without podiatric complications.  Nail dystrophy.  Plan: Follow-up with her in 1 year.  Discussed podiatric diabetic education.

## 2022-08-05 ENCOUNTER — Encounter: Payer: Self-pay | Admitting: Ophthalmology

## 2022-08-05 NOTE — Discharge Instructions (Signed)

## 2022-08-10 ENCOUNTER — Ambulatory Visit: Payer: Medicare Other | Admitting: General Practice

## 2022-08-10 ENCOUNTER — Ambulatory Visit
Admission: RE | Admit: 2022-08-10 | Discharge: 2022-08-10 | Disposition: A | Discharge status: Home / Self Care | Payer: Medicare Other | Attending: Ophthalmology | Admitting: Ophthalmology

## 2022-08-10 ENCOUNTER — Other Ambulatory Visit: Payer: Self-pay

## 2022-08-10 ENCOUNTER — Encounter
Admission: RE | Disposition: A | Discharge status: Home / Self Care | Payer: Self-pay | Source: Home / Self Care | Attending: Ophthalmology

## 2022-08-10 ENCOUNTER — Encounter: Payer: Self-pay | Admitting: Ophthalmology

## 2022-08-10 DIAGNOSIS — H2512 Age-related nuclear cataract, left eye: Secondary | ICD-10-CM | POA: Diagnosis not present

## 2022-08-10 DIAGNOSIS — E1136 Type 2 diabetes mellitus with diabetic cataract: Secondary | ICD-10-CM | POA: Insufficient documentation

## 2022-08-10 DIAGNOSIS — Z87891 Personal history of nicotine dependence: Secondary | ICD-10-CM | POA: Insufficient documentation

## 2022-08-10 DIAGNOSIS — J45909 Unspecified asthma, uncomplicated: Secondary | ICD-10-CM | POA: Diagnosis not present

## 2022-08-10 DIAGNOSIS — K219 Gastro-esophageal reflux disease without esophagitis: Secondary | ICD-10-CM | POA: Diagnosis not present

## 2022-08-10 DIAGNOSIS — Z7984 Long term (current) use of oral hypoglycemic drugs: Secondary | ICD-10-CM | POA: Diagnosis not present

## 2022-08-10 DIAGNOSIS — E119 Type 2 diabetes mellitus without complications: Secondary | ICD-10-CM | POA: Insufficient documentation

## 2022-08-10 DIAGNOSIS — I10 Essential (primary) hypertension: Secondary | ICD-10-CM | POA: Diagnosis not present

## 2022-08-10 HISTORY — DX: Anemia, unspecified: D64.9

## 2022-08-10 HISTORY — PX: CATARACT EXTRACTION W/PHACO: SHX586

## 2022-08-10 HISTORY — DX: Trigeminal neuralgia: G50.0

## 2022-08-10 LAB — GLUCOSE, CAPILLARY: Glucose-Capillary: 88 mg/dL (ref 70–99)

## 2022-08-10 SURGERY — PHACOEMULSIFICATION, CATARACT, WITH IOL INSERTION
Anesthesia: Monitor Anesthesia Care | Site: Eye | Laterality: Left

## 2022-08-10 MED ORDER — LACTATED RINGERS IV SOLN: INTRAVENOUS | Status: DC

## 2022-08-10 MED ORDER — ARMC OPHTHALMIC DILATING DROPS
1.0000 | OPHTHALMIC | Status: DC | PRN
  Administered 2022-08-10 (×3): 1 via OPHTHALMIC

## 2022-08-10 MED ORDER — SIGHTPATH DOSE#1 BSS IO SOLN
INTRAOCULAR | Status: DC | PRN
  Administered 2022-08-10: 1 mL

## 2022-08-10 MED ORDER — FENTANYL CITRATE (PF) 100 MCG/2ML IJ SOLN
INTRAMUSCULAR | Status: DC | PRN
  Administered 2022-08-10: 100 ug via INTRAVENOUS

## 2022-08-10 MED ORDER — SIGHTPATH DOSE#1 BSS IO SOLN
INTRAOCULAR | Status: DC | PRN
  Administered 2022-08-10: 61 mL via OPHTHALMIC

## 2022-08-10 MED ORDER — MOXIFLOXACIN HCL 0.5 % OP SOLN
OPHTHALMIC | Status: DC | PRN
  Administered 2022-08-10: .2 mL via OPHTHALMIC

## 2022-08-10 MED ORDER — TETRACAINE HCL 0.5 % OP SOLN
1.0000 [drp] | OPHTHALMIC | Status: DC | PRN
  Administered 2022-08-10 (×3): 1 [drp] via OPHTHALMIC

## 2022-08-10 MED ORDER — SIGHTPATH DOSE#1 BSS IO SOLN
INTRAOCULAR | Status: DC | PRN
  Administered 2022-08-10: 15 mL

## 2022-08-10 MED ORDER — MIDAZOLAM HCL 2 MG/2ML IJ SOLN
INTRAMUSCULAR | Status: DC | PRN
  Administered 2022-08-10: 2 mg via INTRAVENOUS

## 2022-08-10 MED ORDER — BRIMONIDINE TARTRATE-TIMOLOL 0.2-0.5 % OP SOLN
OPHTHALMIC | Status: DC | PRN
  Administered 2022-08-10: 1 [drp] via OPHTHALMIC

## 2022-08-10 MED ORDER — SIGHTPATH DOSE#1 NA CHONDROIT SULF-NA HYALURON 40-17 MG/ML IO SOLN
INTRAOCULAR | Status: DC | PRN
  Administered 2022-08-10: 1 mL via INTRAOCULAR

## 2022-08-10 SURGICAL SUPPLY — 15 items
CANNULA ANT/CHMB 27G (MISCELLANEOUS) IMPLANT
CANNULA ANT/CHMB 27GA (MISCELLANEOUS) IMPLANT
CATARACT SUITE SIGHTPATH (MISCELLANEOUS) ×1 IMPLANT
FEE CATARACT SUITE SIGHTPATH (MISCELLANEOUS) ×2 IMPLANT
GLOVE BIOGEL PI IND STRL 8 (GLOVE) ×2 IMPLANT
GLOVE SURG ENC TEXT LTX SZ8 (GLOVE) ×2 IMPLANT
LENS IOL TECNIS EYHANCE 16.0 (Intraocular Lens) IMPLANT
NDL FILTER BLUNT 18X1 1/2 (NEEDLE) ×2 IMPLANT
NEEDLE FILTER BLUNT 18X1 1/2 (NEEDLE) ×1 IMPLANT
PACK VIT ANT 23G (MISCELLANEOUS) IMPLANT
RING MALYGIN (MISCELLANEOUS) IMPLANT
SUT ETHILON 10-0 CS-B-6CS-B-6 (SUTURE)
SUTURE EHLN 10-0 CS-B-6CS-B-6 (SUTURE) IMPLANT
SYR 3ML LL SCALE MARK (SYRINGE) ×2 IMPLANT
WATER STERILE IRR 250ML POUR (IV SOLUTION) ×2 IMPLANT

## 2022-08-10 NOTE — H&P (Signed)
[place]   Primary Care Physician:  Tracie Harrier, MD Ophthalmologist: Dr. George Ina  Pre-Procedure History & Physical: HPI:  Laura Underwood is a 72 y.o. female here for cataract surgery.   Past Medical History:  Diagnosis Date   Abdominal pain, other specified site    Anemia    Arthritis    Asthma    Dr Lake Bells   Chronic cough    s/p evaluation by allergist and pulmonologist    Colon polyp    Cough    Diabetes mellitus without complication (HCC)    Diverticulosis    Dizziness    GERD (gastroesophageal reflux disease)    Goiter    Headache(784.0)    Hemorrhoids    Hiatal hernia    Irritable bowel disease    Menopause    Shortness of breath    Stress    Trigeminal neuralgia    no issues since 2020   Wears glasses     Past Surgical History:  Procedure Laterality Date   BRAVO [place] STUDY N/A 08/15/2013   Procedure: BRAVO [place];  Surgeon: Lear Ng, MD;  Location: [facility];  Service: Endoscopy;  Laterality: N/A;   CESAREAN SECTION  [id number]   CESAREAN SECTION  87,92   CHOLECYSTECTOMY  09/13/14   COLONOSCOPY N/A 08/15/2013   Procedure: COLONOSCOPY;  Surgeon: Lear Ng, MD;  Location: [facility];  Service: Endoscopy;  Laterality: N/A;   ESOPHAGOGASTRODUODENOSCOPY N/A 08/15/2013   Procedure: ESOPHAGOGASTRODUODENOSCOPY (EGD);  Surgeon: Lear Ng, MD;  Location: [facility];  Service: Endoscopy;  Laterality: N/A;   jaw abscess  jan 2012   LAPAROSCOPY  1981   ovarin cyst  2004   ovary removed    Prior to Admission medications   Medication Sig Start Date End Date Taking? Authorizing Provider  albuterol (PROVENTIL HFA;VENTOLIN HFA) 108 (90 Base) MCG/ACT inhaler Inhale 2 puffs into the lungs every 4 (four) hours as needed for wheezing or shortness of breath. 03/22/18  Yes Juanito Doom, MD  amLODipine (NORVASC) 5 MG tablet Take 5 mg by mouth daily. 05/15/21  Yes [provider]  ascorbic acid (VITAMIN C) 500 MG tablet  Take 500 mg by mouth daily.   Yes [provider]  aspirin 81 MG tablet Take 81 mg by mouth daily.   Yes [provider]  calcium carbonate (OSCAL) 1500 (600 Ca) MG TABS tablet Take 1,500 mg by mouth 2 (two) times daily with a meal.   Yes [provider]  carbamazepine (TEGRETOL) 200 MG tablet Take 200 mg by mouth 2 (two) times daily.  03/30/18  Yes [provider]  cetirizine (ZYRTEC) 10 MG tablet Take 1 mg by mouth daily.   Yes [provider]  Cholecalciferol (VITAMIN D PO) Take by mouth daily.   Yes [provider]  docusate sodium (COLACE) 100 MG capsule Take 100 mg by mouth 2 (two) times daily.   Yes [provider]  Ferrous Sulfate (IRON PO) Take by mouth daily.   Yes [provider]  folic acid (FOLVITE) A999333 MCG tablet Take 400 mcg by mouth daily.   Yes [provider]  hydroxypropyl methylcellulose / hypromellose (ISOPTO TEARS / GONIOVISC) 2.5 % ophthalmic solution Place 1 drop into both eyes as needed for dry eyes.   Yes [provider]  metFORMIN (GLUCOPHAGE-XR) 500 MG 24 hr tablet TAKE 1 TABLET BY MOUTH EVERY EVENING 09/25/18  Yes Arnett, Yvetta Coder, FNP  Misc Natural Products (TART CHERRY  ADVANCED PO) Take 1,200 mg by mouth daily.   Yes [provider]  montelukast (SINGULAIR) 10 MG tablet Take 10 mg by mouth at bedtime. 04/16/19  Yes [provider]  Multiple Vitamin (MULTIVITAMIN) tablet Take 1 tablet by mouth daily.   Yes [provider]  Omega-3 Fatty Acids (FISH OIL) 1200 MG CAPS Take by mouth.   Yes [provider]  rosuvastatin (CRESTOR) 10 MG tablet Take 1 tablet (10 mg total) by mouth daily. 04/24/18  Yes Burnard Hawthorne, FNP  TURMERIC PO Take 500 mg by mouth daily.   Yes [provider]  vitamin B-12 (CYANOCOBALAMIN) 100 MCG tablet Take 100 mcg by mouth daily.   Yes [provider]    Allergies as of 07/19/2022 - Review Complete  07/07/2022  Allergen Reaction Noted   Latex  12/18/2010   Nickel  02/02/2011   Benzalkonium chloride Rash 08/23/2011   Neosporin [neomycin-bacitracin zn-polymyx] Rash 12/18/2010    Family History  Problem Relation Age of Onset   Stroke Mother    Hypertension Mother    Hyperlipidemia Mother    Depression Mother    Hypertension Sister    Prostate cancer Father    Liver cancer Father        metastatic liver   Breast cancer Maternal Aunt    Depression Maternal Aunt    Depression Maternal Grandfather    Depression Cousin    Pancreatic cancer Paternal Aunt    Pancreatic cancer Paternal Grandmother     Social History   Socioeconomic History   Marital status: Married    Spouse name: Not on file   Number of children: Y   Years of education: Not on file   Highest education level: Not on file  Occupational History   Occupation: Therapist, sports, ins biller, coder,office mgr.    Employer: [employer]  Tobacco Use   Smoking status: Never   Smokeless tobacco: Never  Vaping Use   Vaping Use: Never used  Substance and Sexual Activity   Alcohol use: Yes    Alcohol/week: 1.0 - 2.0 standard drink of alcohol    Types: 1 - 2 Cans of beer per week    Comment: rarely   Drug use: No   Sexual activity: Not on file  Other Topics Concern   Not on file  Social History Narrative   Lives in [place]. 3 children, daughters live nearby, son lives in [place].      Work - Research officer, political party and coding, Therapist, sports      Diet - healthy, limited caffeine      Exercise- not working out right now.   Social Determinants of Health   Financial Resource Strain: Not on file  Food Insecurity: Not on file  Transportation Needs: Not on file  Physical Activity: Not on file  Stress: Not on file  Social Connections: Not on file  Intimate Partner Violence: Not on file    Review of Systems: See HPI, otherwise negative ROS  Physical Exam: BP (!) 155/84   Pulse 66   Temp 97.7 F (36.5 C)  (Temporal)   Resp 12   Ht '5\' 5"'$  (1.651 m)   Wt 82.6 kg   SpO2 100%   BMI 30.29 kg/m  General:   Alert, cooperative in NAD Head:  Normocephalic and atraumatic. Respiratory:  Normal work of breathing. Cardiovascular:  RRR  Impression/Plan: Laura Underwood is here for cataract surgery.  Risks, benefits, limitations, and alternatives regarding cataract surgery have been  reviewed with the patient.  Questions have been answered.  All parties agreeable.   Birder Robson, MD  08/10/2022, 7:18 AM

## 2022-08-10 NOTE — Transfer of Care (Signed)
Immediate Anesthesia Transfer of Care Note  Patient: Laura Underwood  Procedure(s) Performed: CATARACT EXTRACTION PHACO AND INTRAOCULAR LENS PLACEMENT (IOC) LEFT DIABETIC (Left: Eye)  Patient Location: PACU  Anesthesia Type: MAC  Level of Consciousness: awake, alert  and patient cooperative  Airway and Oxygen Therapy: Patient Spontanous Breathing and Patient connected to supplemental oxygen  Post-op Assessment: Post-op Vital signs reviewed, Patient's Cardiovascular Status Stable, Respiratory Function Stable, Patent Airway and No signs of Nausea or vomiting  Post-op Vital Signs: Reviewed and stable  Complications: No notable events documented.

## 2022-08-10 NOTE — Anesthesia Preprocedure Evaluation (Signed)
Anesthesia Evaluation  Patient identified by MRN, date of birth, ID band Patient awake    Reviewed: Allergy & Precautions, NPO status , Patient's Chart, lab work & pertinent test results  History of Anesthesia Complications Negative for: history of anesthetic complications  Airway Mallampati: III  TM Distance: >3 FB Neck ROM: Full    Dental no notable dental hx. (+) Teeth Intact   Pulmonary asthma , neg sleep apnea, neg COPD, Patient abstained from smoking.Not current smoker Inhaler use every 2 days, last taken this morning out of precaution. Breathing feels well today   Pulmonary exam normal breath sounds clear to auscultation       Cardiovascular Exercise Tolerance: Good METShypertension, (-) CAD and (-) Past MI (-) dysrhythmias  Rhythm:Regular Rate:Normal - Systolic murmurs    Neuro/Psych  Headaches  negative psych ROS   GI/Hepatic ,GERD  Controlled,,(+)     (-) substance abuse    Endo/Other  diabetes    Renal/GU negative Renal ROS     Musculoskeletal   Abdominal   Peds  Hematology   Anesthesia Other Findings Past Medical History: No date: Abdominal pain, other specified site No date: Anemia No date: Arthritis No date: Asthma     Comment:  Dr Lake Bells No date: Chronic cough     Comment:  s/p evaluation by allergist and pulmonologist  No date: Colon polyp No date: Cough No date: Diabetes mellitus without complication (HCC) No date: Diverticulosis No date: Dizziness No date: GERD (gastroesophageal reflux disease) No date: Goiter No date: Headache(784.0) No date: Hemorrhoids No date: Hiatal hernia No date: Irritable bowel disease No date: Menopause No date: Shortness of breath No date: Stress No date: Trigeminal neuralgia     Comment:  no issues since 2020 No date: Wears glasses  Reproductive/Obstetrics                             Anesthesia Physical Anesthesia  Plan  ASA: 2  Anesthesia Plan: MAC   Post-op Pain Management:    Induction: Intravenous  PONV Risk Score and Plan: 2 and Midazolam  Airway Management Planned: Nasal Cannula  Additional Equipment:   Intra-op Plan:   Post-operative Plan:   Informed Consent: I have reviewed the patients History and Physical, chart, labs and discussed the procedure including the risks, benefits and alternatives for the proposed anesthesia with the patient or authorized representative who has indicated his/her understanding and acceptance.       Plan Discussed with: CRNA and Surgeon  Anesthesia Plan Comments: (Explained risks of anesthesia, including PONV, and rare emergencies such as cardiac events, respiratory problems, and allergic reactions, requiring invasive intervention. Discussed the role of CRNA in patient's perioperative care. Patient understands. )       Anesthesia Quick Evaluation

## 2022-08-10 NOTE — Op Note (Signed)
PREOPERATIVE DIAGNOSIS:  Nuclear sclerotic cataract of the left eye.   POSTOPERATIVE DIAGNOSIS:  Nuclear sclerotic cataract of the left eye.   OPERATIVE PROCEDURE:ORPROCALL@   SURGEON:  Birder Robson, MD.   ANESTHESIA:  Anesthesiologist: Arita Miss, MD CRNA: Tobie Poet, CRNA  1.      Managed anesthesia care. 2.     0.38m of Shugarcaine was instilled following the paracentesis   COMPLICATIONS:  None.   TECHNIQUE:   Stop and chop   DESCRIPTION OF PROCEDURE:  The patient was examined and consented in the preoperative holding area where the aforementioned topical anesthesia was applied to the left eye and then brought back to the Operating Room where the left eye was prepped and draped in the usual sterile ophthalmic fashion and a lid speculum was placed. A paracentesis was created with the side port blade and the anterior chamber was filled with viscoelastic. A near clear corneal incision was performed with the steel keratome. A continuous curvilinear capsulorrhexis was performed with a cystotome followed by the capsulorrhexis forceps. Hydrodissection and hydrodelineation were carried out with BSS on a blunt cannula. The lens was removed in a stop and chop  technique and the remaining cortical material was removed with the irrigation-aspiration handpiece. The capsular bag was inflated with viscoelastic and the Technis [id number] lens was placed in the capsular bag without complication. The remaining viscoelastic was removed from the eye with the irrigation-aspiration handpiece. The wounds were hydrated. The anterior chamber was flushed with BSS and the eye was inflated to physiologic pressure. 0.163mVigamox was placed in the anterior chamber. The wounds were found to be water tight. The eye was dressed with Combigan. The patient was given protective glasses to wear throughout the day and a shield with which to sleep tonight. The patient was also given drops with which to begin a drop regimen  today and will follow-up with me in one day. Implant Name Type Inv. Item Serial No. Manufacturer Lot No. LRB No. Used Action  LENS IOL TECNIS EYHANCE 16.0 - [id number] Lens LENS IOL TECNIS EYHANCE 16.0 [id number]  Left 1 Implanted    Procedure(s) with comments: CATARACT EXTRACTION PHACO AND INTRAOCULAR LENS PLACEMENT (IOC) LEFT DIABETIC (Left) - 6.60 0:43.7  Electronically signed: WiBirder Robson/27/2024 7:47 AM

## 2022-08-10 NOTE — Anesthesia Postprocedure Evaluation (Signed)
Anesthesia Post Note  Patient: Laura Underwood  Procedure(s) Performed: CATARACT EXTRACTION PHACO AND INTRAOCULAR LENS PLACEMENT (IOC) LEFT DIABETIC (Left: Eye)  Patient location during evaluation: PACU Anesthesia Type: MAC Level of consciousness: awake and alert Pain management: pain level controlled Vital Signs Assessment: post-procedure vital signs reviewed and stable Respiratory status: spontaneous breathing, nonlabored ventilation, respiratory function stable and patient connected to nasal cannula oxygen Cardiovascular status: stable and blood pressure returned to baseline Postop Assessment: no apparent nausea or vomiting Anesthetic complications: no   No notable events documented.   Last Vitals:  Vitals:   08/10/22 0748 08/10/22 0753  BP: 112/67 119/70  Pulse: 62 65  Resp: 17 18  Temp: (!) 36.2 C (!) 36.2 C  SpO2: 100% 100%    Last Pain:  Vitals:   08/10/22 0753  TempSrc:   PainSc: 0-No pain                 Arita Miss

## 2022-08-11 ENCOUNTER — Encounter: Payer: Self-pay | Admitting: Ophthalmology

## 2022-08-31 ENCOUNTER — Ambulatory Visit: Admit: 2022-08-31 | Payer: BLUE CROSS/BLUE SHIELD | Admitting: Ophthalmology

## 2022-08-31 SURGERY — PHACOEMULSIFICATION, CATARACT, WITH IOL INSERTION
Anesthesia: Topical | Laterality: Right

## 2023-07-13 ENCOUNTER — Ambulatory Visit (INDEPENDENT_AMBULATORY_CARE_PROVIDER_SITE_OTHER): Payer: Medicare Other | Admitting: Podiatry

## 2023-07-13 ENCOUNTER — Encounter: Payer: Self-pay | Admitting: Podiatry

## 2023-07-13 DIAGNOSIS — E119 Type 2 diabetes mellitus without complications: Secondary | ICD-10-CM | POA: Diagnosis not present

## 2023-07-13 NOTE — Progress Notes (Signed)
She presents today after having not seen her for a year for a diabetic foot exam.  States that her latest hemoglobin A1c is at 6.4.  She has no foot issues denies fever chills nausea vomiting muscle aches pains calf pain back pain chest pain shortness of breath tingling in her toes feet hands or fingers.  She denies loss of balance.  Objective: Vital signs are stable she is alert and oriented x 3 there is no erythema edema cellulitis drainage or odor no skin breakdown no preulcerative lesions.  Muscle strength is normal and symmetrical bilateral dorsiflexors plantar flexors inverters everters extensors and flexors of the legs.  Intrinsic musculature is normal in the foot.  Assessment: Diabetes mellitus without complications bilateral.  Plan: Follow-up with her in 1 year.
